# Patient Record
Sex: Male | Born: 1975
Health system: Southern US, Community
[De-identification: ages and names within clinical notes are randomized; demographics above are authoritative.]

## PROBLEM LIST (undated history)

## (undated) DIAGNOSIS — N289 Disorder of kidney and ureter, unspecified: Secondary | ICD-10-CM

## (undated) DIAGNOSIS — I10 Essential (primary) hypertension: Secondary | ICD-10-CM

## (undated) DIAGNOSIS — Z941 Heart transplant status: Secondary | ICD-10-CM

## (undated) HISTORY — PX: HERNIA REPAIR: SHX51

## (undated) HISTORY — PX: CHOLECYSTECTOMY: SHX55

## (undated) HISTORY — PX: APPENDECTOMY: SHX54

## (undated) HISTORY — PX: OTHER SURGICAL HISTORY: SHX169

## (undated) HISTORY — PX: CARDIAC SURGERY: SHX584

---

## 2005-02-01 ENCOUNTER — Emergency Department (HOSPITAL_COMMUNITY): Admission: EM | Admit: 2005-02-01 | Discharge: 2005-02-01 | Payer: Self-pay | Admitting: Emergency Medicine

## 2008-01-02 ENCOUNTER — Emergency Department (HOSPITAL_COMMUNITY): Admission: EM | Admit: 2008-01-02 | Discharge: 2008-01-02 | Payer: Self-pay | Admitting: Emergency Medicine

## 2008-02-02 ENCOUNTER — Emergency Department (HOSPITAL_COMMUNITY): Admission: EM | Admit: 2008-02-02 | Discharge: 2008-02-03 | Payer: Self-pay | Admitting: Emergency Medicine

## 2008-02-07 ENCOUNTER — Emergency Department (HOSPITAL_COMMUNITY): Admission: EM | Admit: 2008-02-07 | Discharge: 2008-02-07 | Payer: Self-pay | Admitting: Emergency Medicine

## 2008-03-07 ENCOUNTER — Ambulatory Visit: Payer: Self-pay | Admitting: Cardiology

## 2008-03-15 ENCOUNTER — Emergency Department (HOSPITAL_COMMUNITY): Admission: EM | Admit: 2008-03-15 | Discharge: 2008-03-16 | Payer: Self-pay | Admitting: Emergency Medicine

## 2008-03-26 ENCOUNTER — Emergency Department (HOSPITAL_COMMUNITY): Admission: EM | Admit: 2008-03-26 | Discharge: 2008-03-27 | Payer: Self-pay | Admitting: Emergency Medicine

## 2008-04-03 ENCOUNTER — Emergency Department (HOSPITAL_COMMUNITY): Admission: EM | Admit: 2008-04-03 | Discharge: 2008-04-03 | Payer: Self-pay | Admitting: Emergency Medicine

## 2011-07-30 LAB — URINALYSIS, ROUTINE W REFLEX MICROSCOPIC
Bilirubin Urine: NEGATIVE
Glucose, UA: NEGATIVE
Ketones, ur: NEGATIVE
Protein, ur: NEGATIVE
Urobilinogen, UA: 0.2

## 2011-07-30 LAB — URINE MICROSCOPIC-ADD ON

## 2011-07-30 LAB — URINE CULTURE

## 2011-08-02 LAB — D-DIMER, QUANTITATIVE: D-Dimer, Quant: 0.22

## 2011-08-13 DIAGNOSIS — Z720 Tobacco use: Secondary | ICD-10-CM | POA: Diagnosis present

## 2011-08-13 DIAGNOSIS — I10 Essential (primary) hypertension: Secondary | ICD-10-CM | POA: Insufficient documentation

## 2011-08-17 DIAGNOSIS — Z8719 Personal history of other diseases of the digestive system: Secondary | ICD-10-CM | POA: Insufficient documentation

## 2011-08-17 DIAGNOSIS — E790 Hyperuricemia without signs of inflammatory arthritis and tophaceous disease: Secondary | ICD-10-CM | POA: Insufficient documentation

## 2014-01-26 DIAGNOSIS — I25811 Atherosclerosis of native coronary artery of transplanted heart without angina pectoris: Secondary | ICD-10-CM | POA: Insufficient documentation

## 2014-05-14 DIAGNOSIS — Z952 Presence of prosthetic heart valve: Secondary | ICD-10-CM

## 2015-11-29 DIAGNOSIS — R404 Transient alteration of awareness: Secondary | ICD-10-CM | POA: Diagnosis not present

## 2015-11-29 DIAGNOSIS — M7989 Other specified soft tissue disorders: Secondary | ICD-10-CM | POA: Diagnosis not present

## 2015-11-29 DIAGNOSIS — Z941 Heart transplant status: Secondary | ICD-10-CM | POA: Diagnosis not present

## 2015-11-29 DIAGNOSIS — R531 Weakness: Secondary | ICD-10-CM | POA: Diagnosis not present

## 2015-11-29 DIAGNOSIS — E871 Hypo-osmolality and hyponatremia: Secondary | ICD-10-CM | POA: Diagnosis not present

## 2015-11-29 DIAGNOSIS — R600 Localized edema: Secondary | ICD-10-CM | POA: Diagnosis not present

## 2015-11-29 DIAGNOSIS — Z79899 Other long term (current) drug therapy: Secondary | ICD-10-CM | POA: Diagnosis not present

## 2015-11-29 DIAGNOSIS — Z7982 Long term (current) use of aspirin: Secondary | ICD-10-CM | POA: Diagnosis not present

## 2015-11-29 DIAGNOSIS — I10 Essential (primary) hypertension: Secondary | ICD-10-CM | POA: Diagnosis not present

## 2015-11-29 DIAGNOSIS — D649 Anemia, unspecified: Secondary | ICD-10-CM | POA: Diagnosis not present

## 2015-11-29 DIAGNOSIS — M109 Gout, unspecified: Secondary | ICD-10-CM | POA: Diagnosis not present

## 2015-11-29 DIAGNOSIS — K219 Gastro-esophageal reflux disease without esophagitis: Secondary | ICD-10-CM | POA: Diagnosis not present

## 2015-11-29 DIAGNOSIS — F17200 Nicotine dependence, unspecified, uncomplicated: Secondary | ICD-10-CM | POA: Diagnosis not present

## 2015-11-29 DIAGNOSIS — N289 Disorder of kidney and ureter, unspecified: Secondary | ICD-10-CM | POA: Diagnosis not present

## 2015-11-30 DIAGNOSIS — Z941 Heart transplant status: Secondary | ICD-10-CM | POA: Diagnosis not present

## 2015-11-30 DIAGNOSIS — I129 Hypertensive chronic kidney disease with stage 1 through stage 4 chronic kidney disease, or unspecified chronic kidney disease: Secondary | ICD-10-CM | POA: Diagnosis not present

## 2015-11-30 DIAGNOSIS — N183 Chronic kidney disease, stage 3 (moderate): Secondary | ICD-10-CM | POA: Diagnosis not present

## 2015-11-30 DIAGNOSIS — R0602 Shortness of breath: Secondary | ICD-10-CM | POA: Diagnosis not present

## 2015-11-30 DIAGNOSIS — I25811 Atherosclerosis of native coronary artery of transplanted heart without angina pectoris: Secondary | ICD-10-CM | POA: Diagnosis not present

## 2015-11-30 DIAGNOSIS — Z79899 Other long term (current) drug therapy: Secondary | ICD-10-CM | POA: Diagnosis not present

## 2015-11-30 DIAGNOSIS — Z87898 Personal history of other specified conditions: Secondary | ICD-10-CM | POA: Diagnosis not present

## 2015-11-30 DIAGNOSIS — R609 Edema, unspecified: Secondary | ICD-10-CM | POA: Diagnosis not present

## 2015-11-30 DIAGNOSIS — Z720 Tobacco use: Secondary | ICD-10-CM | POA: Diagnosis not present

## 2015-11-30 DIAGNOSIS — N179 Acute kidney failure, unspecified: Secondary | ICD-10-CM | POA: Diagnosis not present

## 2015-12-01 DIAGNOSIS — Z79899 Other long term (current) drug therapy: Secondary | ICD-10-CM | POA: Diagnosis not present

## 2015-12-01 DIAGNOSIS — Z941 Heart transplant status: Secondary | ICD-10-CM | POA: Diagnosis not present

## 2015-12-01 DIAGNOSIS — I129 Hypertensive chronic kidney disease with stage 1 through stage 4 chronic kidney disease, or unspecified chronic kidney disease: Secondary | ICD-10-CM | POA: Diagnosis not present

## 2015-12-01 DIAGNOSIS — N179 Acute kidney failure, unspecified: Secondary | ICD-10-CM | POA: Diagnosis not present

## 2015-12-01 DIAGNOSIS — N183 Chronic kidney disease, stage 3 (moderate): Secondary | ICD-10-CM | POA: Diagnosis not present

## 2015-12-01 DIAGNOSIS — I10 Essential (primary) hypertension: Secondary | ICD-10-CM | POA: Diagnosis not present

## 2015-12-01 DIAGNOSIS — I25811 Atherosclerosis of native coronary artery of transplanted heart without angina pectoris: Secondary | ICD-10-CM | POA: Diagnosis not present

## 2015-12-01 DIAGNOSIS — Z720 Tobacco use: Secondary | ICD-10-CM | POA: Diagnosis not present

## 2015-12-16 DIAGNOSIS — Z6822 Body mass index (BMI) 22.0-22.9, adult: Secondary | ICD-10-CM | POA: Diagnosis not present

## 2015-12-16 DIAGNOSIS — Z720 Tobacco use: Secondary | ICD-10-CM | POA: Diagnosis not present

## 2015-12-16 DIAGNOSIS — N19 Unspecified kidney failure: Secondary | ICD-10-CM | POA: Diagnosis not present

## 2015-12-16 DIAGNOSIS — R600 Localized edema: Secondary | ICD-10-CM | POA: Diagnosis not present

## 2015-12-17 DIAGNOSIS — Z79899 Other long term (current) drug therapy: Secondary | ICD-10-CM | POA: Diagnosis not present

## 2015-12-17 DIAGNOSIS — Z941 Heart transplant status: Secondary | ICD-10-CM | POA: Diagnosis not present

## 2015-12-17 DIAGNOSIS — Z5181 Encounter for therapeutic drug level monitoring: Secondary | ICD-10-CM | POA: Diagnosis not present

## 2015-12-19 DIAGNOSIS — Z941 Heart transplant status: Secondary | ICD-10-CM | POA: Diagnosis not present

## 2015-12-31 DIAGNOSIS — Z941 Heart transplant status: Secondary | ICD-10-CM | POA: Diagnosis not present

## 2016-01-24 DIAGNOSIS — Z7982 Long term (current) use of aspirin: Secondary | ICD-10-CM | POA: Diagnosis not present

## 2016-01-24 DIAGNOSIS — H60502 Unspecified acute noninfective otitis externa, left ear: Secondary | ICD-10-CM | POA: Diagnosis not present

## 2016-01-24 DIAGNOSIS — M109 Gout, unspecified: Secondary | ICD-10-CM | POA: Diagnosis not present

## 2016-01-24 DIAGNOSIS — Z941 Heart transplant status: Secondary | ICD-10-CM | POA: Diagnosis not present

## 2016-01-24 DIAGNOSIS — Z79899 Other long term (current) drug therapy: Secondary | ICD-10-CM | POA: Diagnosis not present

## 2016-01-24 DIAGNOSIS — I10 Essential (primary) hypertension: Secondary | ICD-10-CM | POA: Diagnosis not present

## 2016-01-24 DIAGNOSIS — K219 Gastro-esophageal reflux disease without esophagitis: Secondary | ICD-10-CM | POA: Diagnosis not present

## 2016-01-24 DIAGNOSIS — F17200 Nicotine dependence, unspecified, uncomplicated: Secondary | ICD-10-CM | POA: Diagnosis not present

## 2016-01-27 DIAGNOSIS — E7800 Pure hypercholesterolemia, unspecified: Secondary | ICD-10-CM | POA: Diagnosis not present

## 2016-01-27 DIAGNOSIS — I10 Essential (primary) hypertension: Secondary | ICD-10-CM | POA: Diagnosis not present

## 2016-02-19 DIAGNOSIS — E7800 Pure hypercholesterolemia, unspecified: Secondary | ICD-10-CM | POA: Diagnosis not present

## 2016-02-19 DIAGNOSIS — I10 Essential (primary) hypertension: Secondary | ICD-10-CM | POA: Diagnosis not present

## 2016-03-01 DIAGNOSIS — Z941 Heart transplant status: Secondary | ICD-10-CM | POA: Diagnosis not present

## 2016-03-01 DIAGNOSIS — R609 Edema, unspecified: Secondary | ICD-10-CM | POA: Diagnosis not present

## 2016-03-22 DIAGNOSIS — E7800 Pure hypercholesterolemia, unspecified: Secondary | ICD-10-CM | POA: Diagnosis not present

## 2016-03-22 DIAGNOSIS — I10 Essential (primary) hypertension: Secondary | ICD-10-CM | POA: Diagnosis not present

## 2016-03-23 DIAGNOSIS — K5900 Constipation, unspecified: Secondary | ICD-10-CM | POA: Diagnosis not present

## 2016-03-23 DIAGNOSIS — E7800 Pure hypercholesterolemia, unspecified: Secondary | ICD-10-CM | POA: Diagnosis not present

## 2016-03-23 DIAGNOSIS — I10 Essential (primary) hypertension: Secondary | ICD-10-CM | POA: Diagnosis not present

## 2016-03-30 DIAGNOSIS — F17210 Nicotine dependence, cigarettes, uncomplicated: Secondary | ICD-10-CM | POA: Diagnosis not present

## 2016-03-30 DIAGNOSIS — Z7982 Long term (current) use of aspirin: Secondary | ICD-10-CM | POA: Diagnosis not present

## 2016-03-30 DIAGNOSIS — Z4821 Encounter for aftercare following heart transplant: Secondary | ICD-10-CM | POA: Diagnosis not present

## 2016-03-30 DIAGNOSIS — I25811 Atherosclerosis of native coronary artery of transplanted heart without angina pectoris: Secondary | ICD-10-CM | POA: Diagnosis not present

## 2016-03-30 DIAGNOSIS — Z79899 Other long term (current) drug therapy: Secondary | ICD-10-CM | POA: Diagnosis not present

## 2016-03-30 DIAGNOSIS — I129 Hypertensive chronic kidney disease with stage 1 through stage 4 chronic kidney disease, or unspecified chronic kidney disease: Secondary | ICD-10-CM | POA: Diagnosis not present

## 2016-03-30 DIAGNOSIS — Z941 Heart transplant status: Secondary | ICD-10-CM | POA: Diagnosis not present

## 2016-03-30 DIAGNOSIS — R109 Unspecified abdominal pain: Secondary | ICD-10-CM | POA: Diagnosis not present

## 2016-03-30 DIAGNOSIS — N184 Chronic kidney disease, stage 4 (severe): Secondary | ICD-10-CM | POA: Diagnosis not present

## 2016-04-02 DIAGNOSIS — R1033 Periumbilical pain: Secondary | ICD-10-CM

## 2016-04-08 DIAGNOSIS — R109 Unspecified abdominal pain: Secondary | ICD-10-CM | POA: Diagnosis not present

## 2016-04-08 DIAGNOSIS — R599 Enlarged lymph nodes, unspecified: Secondary | ICD-10-CM | POA: Diagnosis not present

## 2016-04-08 DIAGNOSIS — Z941 Heart transplant status: Secondary | ICD-10-CM | POA: Diagnosis not present

## 2016-04-08 DIAGNOSIS — Z79899 Other long term (current) drug therapy: Secondary | ICD-10-CM | POA: Diagnosis not present

## 2016-05-06 DIAGNOSIS — E7800 Pure hypercholesterolemia, unspecified: Secondary | ICD-10-CM | POA: Diagnosis not present

## 2016-05-06 DIAGNOSIS — I10 Essential (primary) hypertension: Secondary | ICD-10-CM | POA: Diagnosis not present

## 2016-05-14 DIAGNOSIS — F17210 Nicotine dependence, cigarettes, uncomplicated: Secondary | ICD-10-CM | POA: Diagnosis not present

## 2016-05-14 DIAGNOSIS — I2510 Atherosclerotic heart disease of native coronary artery without angina pectoris: Secondary | ICD-10-CM | POA: Diagnosis not present

## 2016-05-14 DIAGNOSIS — Z79899 Other long term (current) drug therapy: Secondary | ICD-10-CM | POA: Diagnosis not present

## 2016-05-14 DIAGNOSIS — Z7982 Long term (current) use of aspirin: Secondary | ICD-10-CM | POA: Diagnosis not present

## 2016-05-14 DIAGNOSIS — I129 Hypertensive chronic kidney disease with stage 1 through stage 4 chronic kidney disease, or unspecified chronic kidney disease: Secondary | ICD-10-CM | POA: Diagnosis not present

## 2016-05-14 DIAGNOSIS — R1030 Lower abdominal pain, unspecified: Secondary | ICD-10-CM | POA: Diagnosis not present

## 2016-05-14 DIAGNOSIS — N184 Chronic kidney disease, stage 4 (severe): Secondary | ICD-10-CM | POA: Diagnosis not present

## 2016-05-14 DIAGNOSIS — K5900 Constipation, unspecified: Secondary | ICD-10-CM | POA: Diagnosis not present

## 2016-05-14 DIAGNOSIS — I509 Heart failure, unspecified: Secondary | ICD-10-CM | POA: Diagnosis not present

## 2016-05-14 DIAGNOSIS — D649 Anemia, unspecified: Secondary | ICD-10-CM | POA: Diagnosis not present

## 2016-05-19 DIAGNOSIS — E7800 Pure hypercholesterolemia, unspecified: Secondary | ICD-10-CM | POA: Diagnosis not present

## 2016-05-19 DIAGNOSIS — I10 Essential (primary) hypertension: Secondary | ICD-10-CM | POA: Diagnosis not present

## 2016-06-02 DIAGNOSIS — R109 Unspecified abdominal pain: Secondary | ICD-10-CM | POA: Diagnosis not present

## 2016-06-02 DIAGNOSIS — K219 Gastro-esophageal reflux disease without esophagitis: Secondary | ICD-10-CM | POA: Diagnosis not present

## 2016-06-02 DIAGNOSIS — I700 Atherosclerosis of aorta: Secondary | ICD-10-CM | POA: Diagnosis not present

## 2016-06-02 DIAGNOSIS — I429 Cardiomyopathy, unspecified: Secondary | ICD-10-CM | POA: Diagnosis not present

## 2016-06-02 DIAGNOSIS — Z833 Family history of diabetes mellitus: Secondary | ICD-10-CM | POA: Diagnosis not present

## 2016-06-02 DIAGNOSIS — Z79899 Other long term (current) drug therapy: Secondary | ICD-10-CM | POA: Diagnosis not present

## 2016-06-02 DIAGNOSIS — N3000 Acute cystitis without hematuria: Secondary | ICD-10-CM | POA: Diagnosis not present

## 2016-06-02 DIAGNOSIS — K8020 Calculus of gallbladder without cholecystitis without obstruction: Secondary | ICD-10-CM | POA: Diagnosis not present

## 2016-06-02 DIAGNOSIS — I10 Essential (primary) hypertension: Secondary | ICD-10-CM | POA: Diagnosis not present

## 2016-06-02 DIAGNOSIS — I2510 Atherosclerotic heart disease of native coronary artery without angina pectoris: Secondary | ICD-10-CM | POA: Diagnosis not present

## 2016-06-02 DIAGNOSIS — R1030 Lower abdominal pain, unspecified: Secondary | ICD-10-CM | POA: Diagnosis not present

## 2016-06-02 DIAGNOSIS — Z941 Heart transplant status: Secondary | ICD-10-CM | POA: Diagnosis not present

## 2016-06-02 DIAGNOSIS — Z7982 Long term (current) use of aspirin: Secondary | ICD-10-CM | POA: Diagnosis not present

## 2016-06-02 DIAGNOSIS — N390 Urinary tract infection, site not specified: Secondary | ICD-10-CM | POA: Diagnosis not present

## 2016-06-02 DIAGNOSIS — Z801 Family history of malignant neoplasm of trachea, bronchus and lung: Secondary | ICD-10-CM | POA: Diagnosis not present

## 2016-06-04 DIAGNOSIS — R1030 Lower abdominal pain, unspecified: Secondary | ICD-10-CM | POA: Diagnosis not present

## 2016-06-14 DIAGNOSIS — Z0000 Encounter for general adult medical examination without abnormal findings: Secondary | ICD-10-CM | POA: Diagnosis not present

## 2016-06-14 DIAGNOSIS — Z720 Tobacco use: Secondary | ICD-10-CM | POA: Diagnosis not present

## 2016-06-14 DIAGNOSIS — E7800 Pure hypercholesterolemia, unspecified: Secondary | ICD-10-CM | POA: Diagnosis not present

## 2016-06-14 DIAGNOSIS — Z7189 Other specified counseling: Secondary | ICD-10-CM | POA: Diagnosis not present

## 2016-06-14 DIAGNOSIS — Z299 Encounter for prophylactic measures, unspecified: Secondary | ICD-10-CM | POA: Diagnosis not present

## 2016-06-14 DIAGNOSIS — F17200 Nicotine dependence, unspecified, uncomplicated: Secondary | ICD-10-CM | POA: Diagnosis not present

## 2016-06-14 DIAGNOSIS — Z1211 Encounter for screening for malignant neoplasm of colon: Secondary | ICD-10-CM | POA: Diagnosis not present

## 2016-06-14 DIAGNOSIS — Z79899 Other long term (current) drug therapy: Secondary | ICD-10-CM | POA: Diagnosis not present

## 2016-06-14 DIAGNOSIS — Z1389 Encounter for screening for other disorder: Secondary | ICD-10-CM | POA: Diagnosis not present

## 2016-06-14 DIAGNOSIS — R5383 Other fatigue: Secondary | ICD-10-CM | POA: Diagnosis not present

## 2016-06-29 DIAGNOSIS — I10 Essential (primary) hypertension: Secondary | ICD-10-CM | POA: Diagnosis not present

## 2016-06-29 DIAGNOSIS — D62 Acute posthemorrhagic anemia: Secondary | ICD-10-CM | POA: Insufficient documentation

## 2016-06-29 DIAGNOSIS — Z01812 Encounter for preprocedural laboratory examination: Secondary | ICD-10-CM | POA: Diagnosis not present

## 2016-07-05 ENCOUNTER — Other Ambulatory Visit: Payer: Self-pay | Admitting: Obstetrics and Gynecology

## 2016-07-05 MED ORDER — METRONIDAZOLE 500 MG PO TABS: 500.0000 mg | ORAL_TABLET | Freq: Two times a day (BID) | ORAL | 0 refills | Status: DC

## 2016-07-07 DIAGNOSIS — F17210 Nicotine dependence, cigarettes, uncomplicated: Secondary | ICD-10-CM | POA: Diagnosis not present

## 2016-07-07 DIAGNOSIS — N184 Chronic kidney disease, stage 4 (severe): Secondary | ICD-10-CM | POA: Diagnosis not present

## 2016-07-07 DIAGNOSIS — Z85048 Personal history of other malignant neoplasm of rectum, rectosigmoid junction, and anus: Secondary | ICD-10-CM | POA: Diagnosis not present

## 2016-07-07 DIAGNOSIS — Z1211 Encounter for screening for malignant neoplasm of colon: Secondary | ICD-10-CM | POA: Diagnosis not present

## 2016-07-07 DIAGNOSIS — I130 Hypertensive heart and chronic kidney disease with heart failure and stage 1 through stage 4 chronic kidney disease, or unspecified chronic kidney disease: Secondary | ICD-10-CM | POA: Diagnosis not present

## 2016-07-07 DIAGNOSIS — Z888 Allergy status to other drugs, medicaments and biological substances status: Secondary | ICD-10-CM | POA: Diagnosis not present

## 2016-07-07 DIAGNOSIS — Z941 Heart transplant status: Secondary | ICD-10-CM | POA: Diagnosis not present

## 2016-07-07 DIAGNOSIS — Z85828 Personal history of other malignant neoplasm of skin: Secondary | ICD-10-CM | POA: Diagnosis not present

## 2016-07-07 DIAGNOSIS — E785 Hyperlipidemia, unspecified: Secondary | ICD-10-CM | POA: Diagnosis not present

## 2016-07-07 DIAGNOSIS — I10 Essential (primary) hypertension: Secondary | ICD-10-CM | POA: Diagnosis not present

## 2016-07-07 DIAGNOSIS — M109 Gout, unspecified: Secondary | ICD-10-CM | POA: Diagnosis not present

## 2016-07-07 DIAGNOSIS — Z79899 Other long term (current) drug therapy: Secondary | ICD-10-CM | POA: Diagnosis not present

## 2016-07-07 DIAGNOSIS — K219 Gastro-esophageal reflux disease without esophagitis: Secondary | ICD-10-CM | POA: Diagnosis not present

## 2016-07-07 DIAGNOSIS — I25811 Atherosclerosis of native coronary artery of transplanted heart without angina pectoris: Secondary | ICD-10-CM | POA: Diagnosis not present

## 2016-07-07 DIAGNOSIS — A630 Anogenital (venereal) warts: Secondary | ICD-10-CM | POA: Diagnosis not present

## 2016-07-07 DIAGNOSIS — Z7982 Long term (current) use of aspirin: Secondary | ICD-10-CM | POA: Diagnosis not present

## 2016-07-07 DIAGNOSIS — R1030 Lower abdominal pain, unspecified: Secondary | ICD-10-CM | POA: Diagnosis not present

## 2016-07-16 DIAGNOSIS — I10 Essential (primary) hypertension: Secondary | ICD-10-CM | POA: Diagnosis not present

## 2016-07-16 DIAGNOSIS — E7800 Pure hypercholesterolemia, unspecified: Secondary | ICD-10-CM | POA: Diagnosis not present

## 2016-07-23 DIAGNOSIS — Z941 Heart transplant status: Secondary | ICD-10-CM | POA: Diagnosis not present

## 2016-07-23 DIAGNOSIS — I420 Dilated cardiomyopathy: Secondary | ICD-10-CM | POA: Diagnosis not present

## 2016-07-23 DIAGNOSIS — Z79899 Other long term (current) drug therapy: Secondary | ICD-10-CM | POA: Diagnosis not present

## 2016-07-23 DIAGNOSIS — N184 Chronic kidney disease, stage 4 (severe): Secondary | ICD-10-CM | POA: Diagnosis not present

## 2016-07-23 DIAGNOSIS — D631 Anemia in chronic kidney disease: Secondary | ICD-10-CM | POA: Diagnosis not present

## 2016-07-23 DIAGNOSIS — E559 Vitamin D deficiency, unspecified: Secondary | ICD-10-CM | POA: Diagnosis not present

## 2016-07-23 DIAGNOSIS — N183 Chronic kidney disease, stage 3 (moderate): Secondary | ICD-10-CM | POA: Diagnosis not present

## 2016-07-23 DIAGNOSIS — I130 Hypertensive heart and chronic kidney disease with heart failure and stage 1 through stage 4 chronic kidney disease, or unspecified chronic kidney disease: Secondary | ICD-10-CM | POA: Diagnosis not present

## 2016-07-23 DIAGNOSIS — I25811 Atherosclerosis of native coronary artery of transplanted heart without angina pectoris: Secondary | ICD-10-CM | POA: Diagnosis not present

## 2016-07-23 DIAGNOSIS — F17210 Nicotine dependence, cigarettes, uncomplicated: Secondary | ICD-10-CM | POA: Diagnosis not present

## 2016-07-25 DIAGNOSIS — Z79899 Other long term (current) drug therapy: Secondary | ICD-10-CM | POA: Diagnosis not present

## 2016-07-25 DIAGNOSIS — Z7982 Long term (current) use of aspirin: Secondary | ICD-10-CM | POA: Diagnosis not present

## 2016-07-25 DIAGNOSIS — R112 Nausea with vomiting, unspecified: Secondary | ICD-10-CM | POA: Diagnosis not present

## 2016-07-25 DIAGNOSIS — Z941 Heart transplant status: Secondary | ICD-10-CM | POA: Diagnosis not present

## 2016-07-25 DIAGNOSIS — F17200 Nicotine dependence, unspecified, uncomplicated: Secondary | ICD-10-CM | POA: Diagnosis not present

## 2016-07-25 DIAGNOSIS — B349 Viral infection, unspecified: Secondary | ICD-10-CM | POA: Diagnosis not present

## 2016-07-25 DIAGNOSIS — M109 Gout, unspecified: Secondary | ICD-10-CM | POA: Diagnosis not present

## 2016-07-25 DIAGNOSIS — N189 Chronic kidney disease, unspecified: Secondary | ICD-10-CM | POA: Diagnosis not present

## 2016-07-25 DIAGNOSIS — K219 Gastro-esophageal reflux disease without esophagitis: Secondary | ICD-10-CM | POA: Diagnosis not present

## 2016-07-25 DIAGNOSIS — I129 Hypertensive chronic kidney disease with stage 1 through stage 4 chronic kidney disease, or unspecified chronic kidney disease: Secondary | ICD-10-CM | POA: Diagnosis not present

## 2016-07-29 DIAGNOSIS — I130 Hypertensive heart and chronic kidney disease with heart failure and stage 1 through stage 4 chronic kidney disease, or unspecified chronic kidney disease: Secondary | ICD-10-CM | POA: Diagnosis not present

## 2016-07-29 DIAGNOSIS — Z7982 Long term (current) use of aspirin: Secondary | ICD-10-CM | POA: Diagnosis not present

## 2016-07-29 DIAGNOSIS — Z941 Heart transplant status: Secondary | ICD-10-CM | POA: Diagnosis not present

## 2016-07-29 DIAGNOSIS — R1030 Lower abdominal pain, unspecified: Secondary | ICD-10-CM | POA: Diagnosis not present

## 2016-07-29 DIAGNOSIS — D509 Iron deficiency anemia, unspecified: Secondary | ICD-10-CM | POA: Insufficient documentation

## 2016-07-29 DIAGNOSIS — R1033 Periumbilical pain: Secondary | ICD-10-CM | POA: Diagnosis not present

## 2016-07-29 DIAGNOSIS — C218 Malignant neoplasm of overlapping sites of rectum, anus and anal canal: Secondary | ICD-10-CM | POA: Diagnosis not present

## 2016-07-29 DIAGNOSIS — I509 Heart failure, unspecified: Secondary | ICD-10-CM | POA: Diagnosis not present

## 2016-07-29 DIAGNOSIS — Z888 Allergy status to other drugs, medicaments and biological substances status: Secondary | ICD-10-CM | POA: Diagnosis not present

## 2016-07-29 DIAGNOSIS — D508 Other iron deficiency anemias: Secondary | ICD-10-CM | POA: Diagnosis not present

## 2016-07-29 DIAGNOSIS — Z8719 Personal history of other diseases of the digestive system: Secondary | ICD-10-CM | POA: Diagnosis not present

## 2016-07-29 DIAGNOSIS — I2510 Atherosclerotic heart disease of native coronary artery without angina pectoris: Secondary | ICD-10-CM | POA: Diagnosis not present

## 2016-07-29 DIAGNOSIS — K219 Gastro-esophageal reflux disease without esophagitis: Secondary | ICD-10-CM | POA: Diagnosis not present

## 2016-07-29 DIAGNOSIS — F17210 Nicotine dependence, cigarettes, uncomplicated: Secondary | ICD-10-CM | POA: Diagnosis not present

## 2016-07-29 DIAGNOSIS — Z79899 Other long term (current) drug therapy: Secondary | ICD-10-CM | POA: Diagnosis not present

## 2016-07-29 DIAGNOSIS — N189 Chronic kidney disease, unspecified: Secondary | ICD-10-CM | POA: Diagnosis not present

## 2016-07-29 DIAGNOSIS — Z886 Allergy status to analgesic agent status: Secondary | ICD-10-CM | POA: Diagnosis not present

## 2016-08-09 DIAGNOSIS — R1033 Periumbilical pain: Secondary | ICD-10-CM | POA: Diagnosis not present

## 2016-08-09 DIAGNOSIS — C218 Malignant neoplasm of overlapping sites of rectum, anus and anal canal: Secondary | ICD-10-CM | POA: Diagnosis not present

## 2016-08-09 DIAGNOSIS — K8020 Calculus of gallbladder without cholecystitis without obstruction: Secondary | ICD-10-CM | POA: Diagnosis not present

## 2016-08-30 DIAGNOSIS — I10 Essential (primary) hypertension: Secondary | ICD-10-CM | POA: Diagnosis not present

## 2016-08-30 DIAGNOSIS — E7800 Pure hypercholesterolemia, unspecified: Secondary | ICD-10-CM | POA: Diagnosis not present

## 2016-08-31 DIAGNOSIS — Z941 Heart transplant status: Secondary | ICD-10-CM | POA: Diagnosis not present

## 2016-08-31 DIAGNOSIS — I083 Combined rheumatic disorders of mitral, aortic and tricuspid valves: Secondary | ICD-10-CM | POA: Diagnosis not present

## 2016-08-31 DIAGNOSIS — I208 Other forms of angina pectoris: Secondary | ICD-10-CM | POA: Diagnosis not present

## 2016-08-31 DIAGNOSIS — I272 Pulmonary hypertension, unspecified: Secondary | ICD-10-CM | POA: Diagnosis not present

## 2016-08-31 DIAGNOSIS — E790 Hyperuricemia without signs of inflammatory arthritis and tophaceous disease: Secondary | ICD-10-CM | POA: Diagnosis not present

## 2016-08-31 DIAGNOSIS — R1013 Epigastric pain: Secondary | ICD-10-CM | POA: Diagnosis not present

## 2016-08-31 DIAGNOSIS — Z79899 Other long term (current) drug therapy: Secondary | ICD-10-CM | POA: Diagnosis not present

## 2016-09-10 DIAGNOSIS — I10 Essential (primary) hypertension: Secondary | ICD-10-CM | POA: Diagnosis not present

## 2016-09-10 DIAGNOSIS — E785 Hyperlipidemia, unspecified: Secondary | ICD-10-CM | POA: Diagnosis not present

## 2016-09-10 DIAGNOSIS — I129 Hypertensive chronic kidney disease with stage 1 through stage 4 chronic kidney disease, or unspecified chronic kidney disease: Secondary | ICD-10-CM | POA: Diagnosis not present

## 2016-09-10 DIAGNOSIS — R109 Unspecified abdominal pain: Secondary | ICD-10-CM | POA: Diagnosis not present

## 2016-09-14 DIAGNOSIS — N189 Chronic kidney disease, unspecified: Secondary | ICD-10-CM | POA: Diagnosis not present

## 2016-09-14 DIAGNOSIS — Z941 Heart transplant status: Secondary | ICD-10-CM | POA: Diagnosis not present

## 2016-09-14 DIAGNOSIS — I129 Hypertensive chronic kidney disease with stage 1 through stage 4 chronic kidney disease, or unspecified chronic kidney disease: Secondary | ICD-10-CM | POA: Diagnosis not present

## 2016-09-14 DIAGNOSIS — I208 Other forms of angina pectoris: Secondary | ICD-10-CM | POA: Diagnosis not present

## 2016-09-14 DIAGNOSIS — R1013 Epigastric pain: Secondary | ICD-10-CM | POA: Diagnosis not present

## 2016-09-17 DIAGNOSIS — K432 Incisional hernia without obstruction or gangrene: Secondary | ICD-10-CM | POA: Diagnosis not present

## 2016-09-17 DIAGNOSIS — K8020 Calculus of gallbladder without cholecystitis without obstruction: Secondary | ICD-10-CM | POA: Diagnosis not present

## 2016-09-17 DIAGNOSIS — I509 Heart failure, unspecified: Secondary | ICD-10-CM | POA: Diagnosis not present

## 2016-09-17 DIAGNOSIS — I2510 Atherosclerotic heart disease of native coronary artery without angina pectoris: Secondary | ICD-10-CM | POA: Diagnosis not present

## 2016-09-17 DIAGNOSIS — F17210 Nicotine dependence, cigarettes, uncomplicated: Secondary | ICD-10-CM | POA: Diagnosis not present

## 2016-09-17 DIAGNOSIS — K219 Gastro-esophageal reflux disease without esophagitis: Secondary | ICD-10-CM | POA: Diagnosis not present

## 2016-09-17 DIAGNOSIS — Z7982 Long term (current) use of aspirin: Secondary | ICD-10-CM | POA: Diagnosis not present

## 2016-09-17 DIAGNOSIS — Z886 Allergy status to analgesic agent status: Secondary | ICD-10-CM | POA: Diagnosis not present

## 2016-09-17 DIAGNOSIS — Z888 Allergy status to other drugs, medicaments and biological substances status: Secondary | ICD-10-CM | POA: Diagnosis not present

## 2016-09-17 DIAGNOSIS — K439 Ventral hernia without obstruction or gangrene: Secondary | ICD-10-CM | POA: Diagnosis not present

## 2016-09-17 DIAGNOSIS — Z941 Heart transplant status: Secondary | ICD-10-CM | POA: Diagnosis not present

## 2016-09-17 DIAGNOSIS — R1013 Epigastric pain: Secondary | ICD-10-CM | POA: Diagnosis not present

## 2016-09-17 DIAGNOSIS — N184 Chronic kidney disease, stage 4 (severe): Secondary | ICD-10-CM | POA: Diagnosis not present

## 2016-09-17 DIAGNOSIS — Z79899 Other long term (current) drug therapy: Secondary | ICD-10-CM | POA: Diagnosis not present

## 2016-09-17 DIAGNOSIS — I130 Hypertensive heart and chronic kidney disease with heart failure and stage 1 through stage 4 chronic kidney disease, or unspecified chronic kidney disease: Secondary | ICD-10-CM | POA: Diagnosis not present

## 2016-09-23 DIAGNOSIS — E7800 Pure hypercholesterolemia, unspecified: Secondary | ICD-10-CM | POA: Diagnosis not present

## 2016-09-23 DIAGNOSIS — I10 Essential (primary) hypertension: Secondary | ICD-10-CM | POA: Diagnosis not present

## 2016-09-27 DIAGNOSIS — F17210 Nicotine dependence, cigarettes, uncomplicated: Secondary | ICD-10-CM | POA: Diagnosis not present

## 2016-09-27 DIAGNOSIS — I351 Nonrheumatic aortic (valve) insufficiency: Secondary | ICD-10-CM | POA: Diagnosis not present

## 2016-09-27 DIAGNOSIS — Z886 Allergy status to analgesic agent status: Secondary | ICD-10-CM | POA: Diagnosis not present

## 2016-09-27 DIAGNOSIS — I130 Hypertensive heart and chronic kidney disease with heart failure and stage 1 through stage 4 chronic kidney disease, or unspecified chronic kidney disease: Secondary | ICD-10-CM | POA: Diagnosis not present

## 2016-09-27 DIAGNOSIS — I509 Heart failure, unspecified: Secondary | ICD-10-CM | POA: Diagnosis not present

## 2016-09-27 DIAGNOSIS — Z7982 Long term (current) use of aspirin: Secondary | ICD-10-CM | POA: Diagnosis not present

## 2016-09-27 DIAGNOSIS — I2510 Atherosclerotic heart disease of native coronary artery without angina pectoris: Secondary | ICD-10-CM | POA: Diagnosis not present

## 2016-09-27 DIAGNOSIS — Z85048 Personal history of other malignant neoplasm of rectum, rectosigmoid junction, and anus: Secondary | ICD-10-CM | POA: Diagnosis not present

## 2016-09-27 DIAGNOSIS — Z941 Heart transplant status: Secondary | ICD-10-CM | POA: Diagnosis not present

## 2016-09-27 DIAGNOSIS — Z888 Allergy status to other drugs, medicaments and biological substances status: Secondary | ICD-10-CM | POA: Diagnosis not present

## 2016-09-27 DIAGNOSIS — Z4821 Encounter for aftercare following heart transplant: Secondary | ICD-10-CM | POA: Diagnosis not present

## 2016-09-27 DIAGNOSIS — Z79899 Other long term (current) drug therapy: Secondary | ICD-10-CM | POA: Diagnosis not present

## 2016-09-27 DIAGNOSIS — N184 Chronic kidney disease, stage 4 (severe): Secondary | ICD-10-CM | POA: Diagnosis not present

## 2016-09-27 DIAGNOSIS — K8020 Calculus of gallbladder without cholecystitis without obstruction: Secondary | ICD-10-CM | POA: Diagnosis not present

## 2016-09-28 DIAGNOSIS — F17210 Nicotine dependence, cigarettes, uncomplicated: Secondary | ICD-10-CM | POA: Diagnosis not present

## 2016-09-28 DIAGNOSIS — F419 Anxiety disorder, unspecified: Secondary | ICD-10-CM | POA: Diagnosis not present

## 2016-09-28 DIAGNOSIS — K811 Chronic cholecystitis: Secondary | ICD-10-CM | POA: Diagnosis not present

## 2016-09-28 DIAGNOSIS — K432 Incisional hernia without obstruction or gangrene: Secondary | ICD-10-CM | POA: Diagnosis not present

## 2016-09-28 DIAGNOSIS — I129 Hypertensive chronic kidney disease with stage 1 through stage 4 chronic kidney disease, or unspecified chronic kidney disease: Secondary | ICD-10-CM | POA: Diagnosis not present

## 2016-09-28 DIAGNOSIS — Z886 Allergy status to analgesic agent status: Secondary | ICD-10-CM | POA: Diagnosis not present

## 2016-09-28 DIAGNOSIS — K219 Gastro-esophageal reflux disease without esophagitis: Secondary | ICD-10-CM | POA: Diagnosis not present

## 2016-09-28 DIAGNOSIS — F329 Major depressive disorder, single episode, unspecified: Secondary | ICD-10-CM | POA: Diagnosis not present

## 2016-09-28 DIAGNOSIS — I25811 Atherosclerosis of native coronary artery of transplanted heart without angina pectoris: Secondary | ICD-10-CM | POA: Diagnosis not present

## 2016-09-28 DIAGNOSIS — D509 Iron deficiency anemia, unspecified: Secondary | ICD-10-CM | POA: Diagnosis not present

## 2016-09-28 DIAGNOSIS — E785 Hyperlipidemia, unspecified: Secondary | ICD-10-CM | POA: Diagnosis not present

## 2016-09-28 DIAGNOSIS — N184 Chronic kidney disease, stage 4 (severe): Secondary | ICD-10-CM | POA: Diagnosis not present

## 2016-09-28 DIAGNOSIS — K8010 Calculus of gallbladder with chronic cholecystitis without obstruction: Secondary | ICD-10-CM | POA: Diagnosis not present

## 2016-09-28 DIAGNOSIS — Z888 Allergy status to other drugs, medicaments and biological substances status: Secondary | ICD-10-CM | POA: Diagnosis not present

## 2016-10-13 DIAGNOSIS — I10 Essential (primary) hypertension: Secondary | ICD-10-CM | POA: Diagnosis not present

## 2016-10-13 DIAGNOSIS — R109 Unspecified abdominal pain: Secondary | ICD-10-CM | POA: Diagnosis not present

## 2016-10-13 DIAGNOSIS — Z299 Encounter for prophylactic measures, unspecified: Secondary | ICD-10-CM | POA: Diagnosis not present

## 2016-10-13 DIAGNOSIS — K589 Irritable bowel syndrome without diarrhea: Secondary | ICD-10-CM | POA: Diagnosis not present

## 2016-10-13 DIAGNOSIS — I25811 Atherosclerosis of native coronary artery of transplanted heart without angina pectoris: Secondary | ICD-10-CM | POA: Diagnosis not present

## 2016-10-15 DIAGNOSIS — I10 Essential (primary) hypertension: Secondary | ICD-10-CM | POA: Diagnosis not present

## 2016-10-15 DIAGNOSIS — E7800 Pure hypercholesterolemia, unspecified: Secondary | ICD-10-CM | POA: Diagnosis not present

## 2016-10-18 DIAGNOSIS — N184 Chronic kidney disease, stage 4 (severe): Secondary | ICD-10-CM | POA: Diagnosis not present

## 2016-10-18 DIAGNOSIS — D702 Other drug-induced agranulocytosis: Secondary | ICD-10-CM | POA: Diagnosis not present

## 2016-10-18 DIAGNOSIS — Z941 Heart transplant status: Secondary | ICD-10-CM | POA: Diagnosis not present

## 2016-10-18 DIAGNOSIS — Z8719 Personal history of other diseases of the digestive system: Secondary | ICD-10-CM | POA: Diagnosis not present

## 2016-10-18 DIAGNOSIS — F17210 Nicotine dependence, cigarettes, uncomplicated: Secondary | ICD-10-CM | POA: Diagnosis not present

## 2016-10-18 DIAGNOSIS — I25811 Atherosclerosis of native coronary artery of transplanted heart without angina pectoris: Secondary | ICD-10-CM | POA: Diagnosis not present

## 2016-10-18 DIAGNOSIS — I129 Hypertensive chronic kidney disease with stage 1 through stage 4 chronic kidney disease, or unspecified chronic kidney disease: Secondary | ICD-10-CM | POA: Diagnosis not present

## 2016-10-18 DIAGNOSIS — Z79899 Other long term (current) drug therapy: Secondary | ICD-10-CM | POA: Diagnosis not present

## 2016-10-18 DIAGNOSIS — Z7289 Other problems related to lifestyle: Secondary | ICD-10-CM | POA: Diagnosis not present

## 2016-10-18 DIAGNOSIS — D508 Other iron deficiency anemias: Secondary | ICD-10-CM | POA: Diagnosis not present

## 2016-10-27 DIAGNOSIS — Z48815 Encounter for surgical aftercare following surgery on the digestive system: Secondary | ICD-10-CM | POA: Diagnosis not present

## 2016-10-27 DIAGNOSIS — K819 Cholecystitis, unspecified: Secondary | ICD-10-CM | POA: Diagnosis not present

## 2016-12-02 DIAGNOSIS — E7800 Pure hypercholesterolemia, unspecified: Secondary | ICD-10-CM | POA: Diagnosis not present

## 2016-12-02 DIAGNOSIS — I10 Essential (primary) hypertension: Secondary | ICD-10-CM | POA: Diagnosis not present

## 2017-01-26 DIAGNOSIS — F17210 Nicotine dependence, cigarettes, uncomplicated: Secondary | ICD-10-CM | POA: Diagnosis not present

## 2017-01-26 DIAGNOSIS — N2581 Secondary hyperparathyroidism of renal origin: Secondary | ICD-10-CM | POA: Diagnosis not present

## 2017-01-26 DIAGNOSIS — I129 Hypertensive chronic kidney disease with stage 1 through stage 4 chronic kidney disease, or unspecified chronic kidney disease: Secondary | ICD-10-CM | POA: Diagnosis not present

## 2017-01-26 DIAGNOSIS — E559 Vitamin D deficiency, unspecified: Secondary | ICD-10-CM | POA: Diagnosis not present

## 2017-01-26 DIAGNOSIS — I420 Dilated cardiomyopathy: Secondary | ICD-10-CM | POA: Diagnosis not present

## 2017-01-26 DIAGNOSIS — I130 Hypertensive heart and chronic kidney disease with heart failure and stage 1 through stage 4 chronic kidney disease, or unspecified chronic kidney disease: Secondary | ICD-10-CM | POA: Diagnosis not present

## 2017-01-26 DIAGNOSIS — I2510 Atherosclerotic heart disease of native coronary artery without angina pectoris: Secondary | ICD-10-CM | POA: Diagnosis not present

## 2017-01-26 DIAGNOSIS — Z720 Tobacco use: Secondary | ICD-10-CM | POA: Diagnosis not present

## 2017-01-26 DIAGNOSIS — N184 Chronic kidney disease, stage 4 (severe): Secondary | ICD-10-CM | POA: Diagnosis not present

## 2017-01-26 DIAGNOSIS — D631 Anemia in chronic kidney disease: Secondary | ICD-10-CM | POA: Diagnosis not present

## 2017-01-26 DIAGNOSIS — Z7982 Long term (current) use of aspirin: Secondary | ICD-10-CM | POA: Diagnosis not present

## 2017-01-26 DIAGNOSIS — Z941 Heart transplant status: Secondary | ICD-10-CM | POA: Diagnosis not present

## 2017-01-26 DIAGNOSIS — Z79899 Other long term (current) drug therapy: Secondary | ICD-10-CM | POA: Diagnosis not present

## 2017-01-26 DIAGNOSIS — I509 Heart failure, unspecified: Secondary | ICD-10-CM | POA: Diagnosis not present

## 2017-01-31 DIAGNOSIS — I10 Essential (primary) hypertension: Secondary | ICD-10-CM | POA: Diagnosis not present

## 2017-01-31 DIAGNOSIS — E7800 Pure hypercholesterolemia, unspecified: Secondary | ICD-10-CM | POA: Diagnosis not present

## 2017-02-08 DIAGNOSIS — I10 Essential (primary) hypertension: Secondary | ICD-10-CM | POA: Diagnosis not present

## 2017-02-08 DIAGNOSIS — Z941 Heart transplant status: Secondary | ICD-10-CM | POA: Diagnosis not present

## 2017-02-08 DIAGNOSIS — I25811 Atherosclerosis of native coronary artery of transplanted heart without angina pectoris: Secondary | ICD-10-CM | POA: Diagnosis not present

## 2017-02-08 DIAGNOSIS — W57XXXA Bitten or stung by nonvenomous insect and other nonvenomous arthropods, initial encounter: Secondary | ICD-10-CM | POA: Diagnosis not present

## 2017-02-08 DIAGNOSIS — F17210 Nicotine dependence, cigarettes, uncomplicated: Secondary | ICD-10-CM | POA: Diagnosis not present

## 2017-02-08 DIAGNOSIS — Z299 Encounter for prophylactic measures, unspecified: Secondary | ICD-10-CM | POA: Diagnosis not present

## 2017-02-08 DIAGNOSIS — E7800 Pure hypercholesterolemia, unspecified: Secondary | ICD-10-CM | POA: Diagnosis not present

## 2017-02-08 DIAGNOSIS — Z6821 Body mass index (BMI) 21.0-21.9, adult: Secondary | ICD-10-CM | POA: Diagnosis not present

## 2017-02-08 DIAGNOSIS — Z713 Dietary counseling and surveillance: Secondary | ICD-10-CM | POA: Diagnosis not present

## 2017-02-18 DIAGNOSIS — Z9049 Acquired absence of other specified parts of digestive tract: Secondary | ICD-10-CM | POA: Diagnosis not present

## 2017-02-18 DIAGNOSIS — F17210 Nicotine dependence, cigarettes, uncomplicated: Secondary | ICD-10-CM | POA: Diagnosis not present

## 2017-02-18 DIAGNOSIS — R1033 Periumbilical pain: Secondary | ICD-10-CM | POA: Diagnosis not present

## 2017-02-18 DIAGNOSIS — R109 Unspecified abdominal pain: Secondary | ICD-10-CM | POA: Diagnosis not present

## 2017-03-02 DIAGNOSIS — E7800 Pure hypercholesterolemia, unspecified: Secondary | ICD-10-CM | POA: Diagnosis not present

## 2017-03-02 DIAGNOSIS — I10 Essential (primary) hypertension: Secondary | ICD-10-CM | POA: Diagnosis not present

## 2017-03-09 DIAGNOSIS — R109 Unspecified abdominal pain: Secondary | ICD-10-CM | POA: Diagnosis not present

## 2017-03-30 DIAGNOSIS — I10 Essential (primary) hypertension: Secondary | ICD-10-CM | POA: Diagnosis not present

## 2017-03-30 DIAGNOSIS — E7800 Pure hypercholesterolemia, unspecified: Secondary | ICD-10-CM | POA: Diagnosis not present

## 2017-04-06 DIAGNOSIS — I2510 Atherosclerotic heart disease of native coronary artery without angina pectoris: Secondary | ICD-10-CM | POA: Diagnosis not present

## 2017-04-06 DIAGNOSIS — I130 Hypertensive heart and chronic kidney disease with heart failure and stage 1 through stage 4 chronic kidney disease, or unspecified chronic kidney disease: Secondary | ICD-10-CM | POA: Diagnosis not present

## 2017-04-06 DIAGNOSIS — Z888 Allergy status to other drugs, medicaments and biological substances status: Secondary | ICD-10-CM | POA: Diagnosis not present

## 2017-04-06 DIAGNOSIS — Z7982 Long term (current) use of aspirin: Secondary | ICD-10-CM | POA: Diagnosis not present

## 2017-04-06 DIAGNOSIS — Z886 Allergy status to analgesic agent status: Secondary | ICD-10-CM | POA: Diagnosis not present

## 2017-04-06 DIAGNOSIS — G894 Chronic pain syndrome: Secondary | ICD-10-CM | POA: Diagnosis not present

## 2017-04-06 DIAGNOSIS — I509 Heart failure, unspecified: Secondary | ICD-10-CM | POA: Diagnosis not present

## 2017-04-06 DIAGNOSIS — Z79899 Other long term (current) drug therapy: Secondary | ICD-10-CM | POA: Diagnosis not present

## 2017-04-06 DIAGNOSIS — N189 Chronic kidney disease, unspecified: Secondary | ICD-10-CM | POA: Diagnosis not present

## 2017-04-06 DIAGNOSIS — F17210 Nicotine dependence, cigarettes, uncomplicated: Secondary | ICD-10-CM | POA: Diagnosis not present

## 2017-04-06 DIAGNOSIS — G8929 Other chronic pain: Secondary | ICD-10-CM | POA: Diagnosis not present

## 2017-04-06 DIAGNOSIS — Z941 Heart transplant status: Secondary | ICD-10-CM | POA: Diagnosis not present

## 2017-04-06 DIAGNOSIS — R109 Unspecified abdominal pain: Secondary | ICD-10-CM | POA: Diagnosis not present

## 2017-04-20 DIAGNOSIS — Z941 Heart transplant status: Secondary | ICD-10-CM | POA: Diagnosis not present

## 2017-04-20 DIAGNOSIS — N183 Chronic kidney disease, stage 3 (moderate): Secondary | ICD-10-CM | POA: Diagnosis not present

## 2017-04-20 DIAGNOSIS — I129 Hypertensive chronic kidney disease with stage 1 through stage 4 chronic kidney disease, or unspecified chronic kidney disease: Secondary | ICD-10-CM | POA: Diagnosis not present

## 2017-04-20 DIAGNOSIS — Z7982 Long term (current) use of aspirin: Secondary | ICD-10-CM | POA: Diagnosis not present

## 2017-04-20 DIAGNOSIS — D508 Other iron deficiency anemias: Secondary | ICD-10-CM | POA: Diagnosis not present

## 2017-04-20 DIAGNOSIS — Z720 Tobacco use: Secondary | ICD-10-CM | POA: Diagnosis not present

## 2017-04-20 DIAGNOSIS — R002 Palpitations: Secondary | ICD-10-CM | POA: Diagnosis not present

## 2017-04-20 DIAGNOSIS — Z79899 Other long term (current) drug therapy: Secondary | ICD-10-CM | POA: Diagnosis not present

## 2017-05-12 DIAGNOSIS — R109 Unspecified abdominal pain: Secondary | ICD-10-CM | POA: Diagnosis not present

## 2017-05-12 DIAGNOSIS — G8929 Other chronic pain: Secondary | ICD-10-CM | POA: Diagnosis not present

## 2017-05-13 DIAGNOSIS — R002 Palpitations: Secondary | ICD-10-CM | POA: Diagnosis not present

## 2017-05-19 DIAGNOSIS — I10 Essential (primary) hypertension: Secondary | ICD-10-CM | POA: Diagnosis not present

## 2017-05-19 DIAGNOSIS — E7800 Pure hypercholesterolemia, unspecified: Secondary | ICD-10-CM | POA: Diagnosis not present

## 2017-06-10 DIAGNOSIS — E7800 Pure hypercholesterolemia, unspecified: Secondary | ICD-10-CM | POA: Diagnosis not present

## 2017-06-10 DIAGNOSIS — I10 Essential (primary) hypertension: Secondary | ICD-10-CM | POA: Diagnosis not present

## 2017-06-14 DIAGNOSIS — F17200 Nicotine dependence, unspecified, uncomplicated: Secondary | ICD-10-CM | POA: Diagnosis not present

## 2017-06-14 DIAGNOSIS — G8929 Other chronic pain: Secondary | ICD-10-CM | POA: Diagnosis not present

## 2017-06-14 DIAGNOSIS — R109 Unspecified abdominal pain: Secondary | ICD-10-CM | POA: Diagnosis not present

## 2017-06-15 DIAGNOSIS — R5383 Other fatigue: Secondary | ICD-10-CM | POA: Diagnosis not present

## 2017-06-15 DIAGNOSIS — F17210 Nicotine dependence, cigarettes, uncomplicated: Secondary | ICD-10-CM | POA: Diagnosis not present

## 2017-06-15 DIAGNOSIS — I25811 Atherosclerosis of native coronary artery of transplanted heart without angina pectoris: Secondary | ICD-10-CM | POA: Diagnosis not present

## 2017-06-15 DIAGNOSIS — Z6821 Body mass index (BMI) 21.0-21.9, adult: Secondary | ICD-10-CM | POA: Diagnosis not present

## 2017-06-15 DIAGNOSIS — I10 Essential (primary) hypertension: Secondary | ICD-10-CM | POA: Diagnosis not present

## 2017-06-15 DIAGNOSIS — N183 Chronic kidney disease, stage 3 (moderate): Secondary | ICD-10-CM | POA: Diagnosis not present

## 2017-06-15 DIAGNOSIS — Z79899 Other long term (current) drug therapy: Secondary | ICD-10-CM | POA: Diagnosis not present

## 2017-06-15 DIAGNOSIS — D649 Anemia, unspecified: Secondary | ICD-10-CM | POA: Diagnosis not present

## 2017-06-15 DIAGNOSIS — Z0000 Encounter for general adult medical examination without abnormal findings: Secondary | ICD-10-CM | POA: Diagnosis not present

## 2017-06-15 DIAGNOSIS — Z941 Heart transplant status: Secondary | ICD-10-CM | POA: Diagnosis not present

## 2017-06-15 DIAGNOSIS — E7800 Pure hypercholesterolemia, unspecified: Secondary | ICD-10-CM | POA: Diagnosis not present

## 2017-06-15 DIAGNOSIS — Z1389 Encounter for screening for other disorder: Secondary | ICD-10-CM | POA: Diagnosis not present

## 2017-06-15 DIAGNOSIS — Z7189 Other specified counseling: Secondary | ICD-10-CM | POA: Diagnosis not present

## 2017-06-15 DIAGNOSIS — Z299 Encounter for prophylactic measures, unspecified: Secondary | ICD-10-CM | POA: Diagnosis not present

## 2017-06-23 DIAGNOSIS — R1084 Generalized abdominal pain: Secondary | ICD-10-CM | POA: Diagnosis not present

## 2017-07-13 DIAGNOSIS — E7800 Pure hypercholesterolemia, unspecified: Secondary | ICD-10-CM | POA: Diagnosis not present

## 2017-07-13 DIAGNOSIS — I10 Essential (primary) hypertension: Secondary | ICD-10-CM | POA: Diagnosis not present

## 2017-07-29 DIAGNOSIS — E7800 Pure hypercholesterolemia, unspecified: Secondary | ICD-10-CM | POA: Diagnosis not present

## 2017-07-29 DIAGNOSIS — H00019 Hordeolum externum unspecified eye, unspecified eyelid: Secondary | ICD-10-CM | POA: Diagnosis not present

## 2017-07-29 DIAGNOSIS — I25811 Atherosclerosis of native coronary artery of transplanted heart without angina pectoris: Secondary | ICD-10-CM | POA: Diagnosis not present

## 2017-07-29 DIAGNOSIS — I10 Essential (primary) hypertension: Secondary | ICD-10-CM | POA: Diagnosis not present

## 2017-07-29 DIAGNOSIS — Z681 Body mass index (BMI) 19 or less, adult: Secondary | ICD-10-CM | POA: Diagnosis not present

## 2017-07-29 DIAGNOSIS — Z941 Heart transplant status: Secondary | ICD-10-CM | POA: Diagnosis not present

## 2017-07-29 DIAGNOSIS — Z299 Encounter for prophylactic measures, unspecified: Secondary | ICD-10-CM | POA: Diagnosis not present

## 2017-08-08 DIAGNOSIS — E7800 Pure hypercholesterolemia, unspecified: Secondary | ICD-10-CM | POA: Diagnosis not present

## 2017-08-08 DIAGNOSIS — I10 Essential (primary) hypertension: Secondary | ICD-10-CM | POA: Diagnosis not present

## 2017-08-10 DIAGNOSIS — D509 Iron deficiency anemia, unspecified: Secondary | ICD-10-CM | POA: Diagnosis not present

## 2017-08-10 DIAGNOSIS — D631 Anemia in chronic kidney disease: Secondary | ICD-10-CM | POA: Diagnosis not present

## 2017-08-10 DIAGNOSIS — Z4821 Encounter for aftercare following heart transplant: Secondary | ICD-10-CM | POA: Diagnosis not present

## 2017-08-10 DIAGNOSIS — Z79899 Other long term (current) drug therapy: Secondary | ICD-10-CM | POA: Diagnosis not present

## 2017-08-10 DIAGNOSIS — R102 Pelvic and perineal pain: Secondary | ICD-10-CM | POA: Diagnosis not present

## 2017-08-10 DIAGNOSIS — N183 Chronic kidney disease, stage 3 (moderate): Secondary | ICD-10-CM | POA: Diagnosis not present

## 2017-08-10 DIAGNOSIS — Z9049 Acquired absence of other specified parts of digestive tract: Secondary | ICD-10-CM | POA: Diagnosis not present

## 2017-08-10 DIAGNOSIS — R002 Palpitations: Secondary | ICD-10-CM | POA: Diagnosis not present

## 2017-08-10 DIAGNOSIS — I129 Hypertensive chronic kidney disease with stage 1 through stage 4 chronic kidney disease, or unspecified chronic kidney disease: Secondary | ICD-10-CM | POA: Diagnosis not present

## 2017-08-10 DIAGNOSIS — F17210 Nicotine dependence, cigarettes, uncomplicated: Secondary | ICD-10-CM | POA: Diagnosis not present

## 2017-08-10 DIAGNOSIS — Z7982 Long term (current) use of aspirin: Secondary | ICD-10-CM | POA: Diagnosis not present

## 2017-08-10 DIAGNOSIS — R634 Abnormal weight loss: Secondary | ICD-10-CM | POA: Diagnosis not present

## 2017-08-10 DIAGNOSIS — Z941 Heart transplant status: Secondary | ICD-10-CM | POA: Diagnosis not present

## 2017-08-10 DIAGNOSIS — R1030 Lower abdominal pain, unspecified: Secondary | ICD-10-CM | POA: Diagnosis not present

## 2017-08-15 DIAGNOSIS — Z941 Heart transplant status: Secondary | ICD-10-CM | POA: Diagnosis not present

## 2017-08-15 DIAGNOSIS — Z4821 Encounter for aftercare following heart transplant: Secondary | ICD-10-CM | POA: Diagnosis not present

## 2017-08-19 DIAGNOSIS — L738 Other specified follicular disorders: Secondary | ICD-10-CM | POA: Diagnosis not present

## 2017-08-19 DIAGNOSIS — Z85828 Personal history of other malignant neoplasm of skin: Secondary | ICD-10-CM | POA: Diagnosis not present

## 2017-08-19 DIAGNOSIS — L720 Epidermal cyst: Secondary | ICD-10-CM | POA: Diagnosis not present

## 2017-08-19 DIAGNOSIS — A630 Anogenital (venereal) warts: Secondary | ICD-10-CM | POA: Diagnosis not present

## 2017-08-19 DIAGNOSIS — Z941 Heart transplant status: Secondary | ICD-10-CM | POA: Diagnosis not present

## 2017-09-04 ENCOUNTER — Emergency Department (HOSPITAL_COMMUNITY): Payer: Medicare Other

## 2017-09-04 ENCOUNTER — Emergency Department (HOSPITAL_COMMUNITY)
Admission: EM | Admit: 2017-09-04 | Discharge: 2017-09-04 | Disposition: A | Discharge status: Home / Self Care | Payer: Medicare Other | Attending: Emergency Medicine | Admitting: Emergency Medicine

## 2017-09-04 ENCOUNTER — Encounter (HOSPITAL_COMMUNITY): Payer: Self-pay

## 2017-09-04 DIAGNOSIS — Z941 Heart transplant status: Secondary | ICD-10-CM | POA: Diagnosis not present

## 2017-09-04 DIAGNOSIS — Y9389 Activity, other specified: Secondary | ICD-10-CM | POA: Diagnosis not present

## 2017-09-04 DIAGNOSIS — Y999 Unspecified external cause status: Secondary | ICD-10-CM | POA: Insufficient documentation

## 2017-09-04 DIAGNOSIS — W01198A Fall on same level from slipping, tripping and stumbling with subsequent striking against other object, initial encounter: Secondary | ICD-10-CM | POA: Diagnosis not present

## 2017-09-04 DIAGNOSIS — I10 Essential (primary) hypertension: Secondary | ICD-10-CM | POA: Insufficient documentation

## 2017-09-04 DIAGNOSIS — F17210 Nicotine dependence, cigarettes, uncomplicated: Secondary | ICD-10-CM | POA: Diagnosis not present

## 2017-09-04 DIAGNOSIS — S60221A Contusion of right hand, initial encounter: Secondary | ICD-10-CM

## 2017-09-04 DIAGNOSIS — M79641 Pain in right hand: Secondary | ICD-10-CM | POA: Diagnosis not present

## 2017-09-04 DIAGNOSIS — Y929 Unspecified place or not applicable: Secondary | ICD-10-CM | POA: Insufficient documentation

## 2017-09-04 DIAGNOSIS — S6991XA Unspecified injury of right wrist, hand and finger(s), initial encounter: Secondary | ICD-10-CM | POA: Diagnosis present

## 2017-09-04 HISTORY — DX: Essential (primary) hypertension: I10

## 2017-09-04 HISTORY — DX: Heart transplant status: Z94.1

## 2017-09-04 MED ORDER — TRAMADOL HCL 50 MG PO TABS: 50.0000 mg | ORAL_TABLET | Freq: Four times a day (QID) | ORAL | 0 refills | Status: DC | PRN

## 2017-09-04 NOTE — ED Triage Notes (Signed)
Patient reports of right hand pain due to hitting wall Friday. Swelling noted.

## 2017-09-04 NOTE — Discharge Instructions (Signed)
Elevate your hand when possible.  Call the orthopedic doctor listed to arrange a follow-up appt in one week if the symptoms are not improving

## 2017-09-04 NOTE — ED Provider Notes (Signed)
[facility] EMERGENCY DEPARTMENT Provider Note   CSN: [id number] Arrival date & time: 09/04/17  1123     History   Chief Complaint Chief Complaint  Patient presents with  . Hand Injury    HPI Antonio Powell is a 42 y.o. male.  HPI   Antonio Powell is a 41 y.o. male who presents to the Emergency Department complaining of pain and swelling of the right hand for 2 days.  States that he accidentally struck a concrete wall while reaching to pick up something.  He complains of continued pain with movement of the middle, ring and little fingers and swelling to the back of his hand.  Has hx of previous 5th metacarpal fx as a teenager.  Denies numbness of the fingers, open wounds, wrist pain and other injuries.  Movement makes the pain worse, nothing makes it better  Past Medical History:  Diagnosis Date  . Heart transplant recipient [facility])   . Hypertension     There are no active problems to display for this patient.   Past Surgical History:  Procedure Laterality Date  . APPENDECTOMY    . CARDIAC SURGERY    . HERNIA REPAIR    . left ear         Home Medications    Prior to Admission medications   Medication Sig Start Date End Date Taking? Authorizing Provider  metroNIDAZOLE (FLAGYL) 500 MG tablet Take 1 tablet (500 mg total) by mouth 2 (two) times daily. 07/05/16   Jonnie Kind, MD    Family History No family history on file.  Social History Social History  Substance Use Topics  . Smoking status: Current Every Day Smoker    Packs/day: 0.50    Types: Cigarettes  . Smokeless tobacco: Never Used  . Alcohol use Yes     Comment: occ     Allergies   Nsaids and Phenergan [promethazine hcl]   Review of Systems Review of Systems  Constitutional: Negative for chills and fever.  Musculoskeletal: Positive for arthralgias (right hand pain) and joint swelling. Negative for neck pain.  Skin: Negative for color change and wound.  Neurological: Negative  for weakness and numbness.  All other systems reviewed and are negative.    Physical Exam Updated Vital Signs BP (!) 145/79   Pulse 89   Temp 98.6 F (37 C) (Temporal)   Resp 16   Ht 5\' 11"  (1.803 m)   Wt 61.2 kg (135 lb)   SpO2 100%   BMI 18.83 kg/m   Physical Exam  Constitutional: He is oriented to person, place, and time. He appears well-developed and well-nourished. No distress.  HENT:  Head: Normocephalic and atraumatic.  Cardiovascular: Normal rate, regular rhythm and intact distal pulses.   Pulmonary/Chest: Effort normal and breath sounds normal.  Musculoskeletal: He exhibits edema and tenderness. He exhibits no deformity.  ttp of the ulnar aspect of the right hand.  Mild to moderate edema with ecchymosis to the palmar aspect and proximal right fifth finger.    CR< 2 sec.  No bony deformity.  Patient has full ROM. No proximal tenderness  Neurological: He is alert and oriented to person, place, and time. He exhibits normal muscle tone. Coordination normal.  Skin: Skin is warm and dry. Capillary refill takes less than 2 seconds.  No open wounds of the right hand  Psychiatric: He has a normal mood and affect.  Nursing note and vitals reviewed.    ED Treatments / Results  Labs (all labs ordered are listed, but only abnormal results are displayed) Labs Reviewed - No data to display  EKG  EKG Interpretation None       Radiology Dg Hand Complete Right  Result Date: 09/04/2017 CLINICAL DATA:  Right hand pain and swelling after hitting a wall on Friday. EXAM: RIGHT HAND - COMPLETE 3+ VIEW COMPARISON:  None. FINDINGS: No osseous fracture line or displaced fracture fragment seen. No joint space dislocation seen. Adjacent soft tissues are unremarkable. IMPRESSION: Negative. Electronically Signed   By: Franki Cabot M.D.   On: 09/04/2017 13:13    Procedures Procedures (including critical care time)  Medications Ordered in ED Medications - No data to  display   Initial Impression / Assessment and Plan / ED Course  I have reviewed the triage vital signs and the nursing notes.  Pertinent labs & imaging results that were available during my care of the patient were reviewed by me and considered in my medical decision making (see chart for details).     NV intact.   XR of hand neg for fx.   Bulky Jones dressing applied by nursing.  Pt agrees to RICE therapy and orthopedic f/u  Final Clinical Impressions(s) / ED Diagnoses   Final diagnoses:  Contusion of right hand, initial encounter    New Prescriptions New Prescriptions   No medications on file     Kem Parkinson, Hershal Coria 09/04/17 2152    Francine Graven, DO 09/07/17 1340

## 2017-09-13 DIAGNOSIS — A630 Anogenital (venereal) warts: Secondary | ICD-10-CM | POA: Diagnosis not present

## 2017-09-21 DIAGNOSIS — A630 Anogenital (venereal) warts: Secondary | ICD-10-CM | POA: Diagnosis not present

## 2017-09-21 DIAGNOSIS — R85613 High grade squamous intraepithelial lesion on cytologic smear of anus (HGSIL): Secondary | ICD-10-CM | POA: Diagnosis not present

## 2017-09-23 DIAGNOSIS — N184 Chronic kidney disease, stage 4 (severe): Secondary | ICD-10-CM | POA: Diagnosis not present

## 2017-09-28 DIAGNOSIS — N184 Chronic kidney disease, stage 4 (severe): Secondary | ICD-10-CM | POA: Diagnosis not present

## 2017-09-28 DIAGNOSIS — Z941 Heart transplant status: Secondary | ICD-10-CM | POA: Diagnosis not present

## 2017-09-28 DIAGNOSIS — I5042 Chronic combined systolic (congestive) and diastolic (congestive) heart failure: Secondary | ICD-10-CM | POA: Diagnosis not present

## 2017-09-28 DIAGNOSIS — I10 Essential (primary) hypertension: Secondary | ICD-10-CM | POA: Diagnosis not present

## 2017-09-28 DIAGNOSIS — I351 Nonrheumatic aortic (valve) insufficiency: Secondary | ICD-10-CM | POA: Diagnosis not present

## 2017-10-06 DIAGNOSIS — Z941 Heart transplant status: Secondary | ICD-10-CM | POA: Diagnosis not present

## 2017-10-07 DIAGNOSIS — A630 Anogenital (venereal) warts: Secondary | ICD-10-CM | POA: Diagnosis not present

## 2017-10-07 DIAGNOSIS — L720 Epidermal cyst: Secondary | ICD-10-CM | POA: Diagnosis not present

## 2017-10-07 DIAGNOSIS — L738 Other specified follicular disorders: Secondary | ICD-10-CM | POA: Diagnosis not present

## 2017-10-11 DIAGNOSIS — D013 Carcinoma in situ of anus and anal canal: Secondary | ICD-10-CM | POA: Insufficient documentation

## 2017-10-21 DIAGNOSIS — E7800 Pure hypercholesterolemia, unspecified: Secondary | ICD-10-CM | POA: Diagnosis not present

## 2017-10-21 DIAGNOSIS — I10 Essential (primary) hypertension: Secondary | ICD-10-CM | POA: Diagnosis not present

## 2017-10-31 ENCOUNTER — Emergency Department (HOSPITAL_COMMUNITY): Payer: Medicare Other

## 2017-10-31 ENCOUNTER — Encounter (HOSPITAL_COMMUNITY): Payer: Self-pay | Admitting: *Deleted

## 2017-10-31 ENCOUNTER — Emergency Department (HOSPITAL_COMMUNITY)
Admission: EM | Admit: 2017-10-31 | Discharge: 2017-10-31 | Disposition: A | Discharge status: Home / Self Care | Payer: Medicare Other | Attending: Emergency Medicine | Admitting: Emergency Medicine

## 2017-10-31 ENCOUNTER — Other Ambulatory Visit: Payer: Self-pay

## 2017-10-31 DIAGNOSIS — I10 Essential (primary) hypertension: Secondary | ICD-10-CM | POA: Diagnosis not present

## 2017-10-31 DIAGNOSIS — F17210 Nicotine dependence, cigarettes, uncomplicated: Secondary | ICD-10-CM | POA: Diagnosis not present

## 2017-10-31 DIAGNOSIS — R05 Cough: Secondary | ICD-10-CM | POA: Diagnosis not present

## 2017-10-31 DIAGNOSIS — H9203 Otalgia, bilateral: Secondary | ICD-10-CM | POA: Diagnosis present

## 2017-10-31 DIAGNOSIS — Z79899 Other long term (current) drug therapy: Secondary | ICD-10-CM | POA: Diagnosis not present

## 2017-10-31 DIAGNOSIS — J189 Pneumonia, unspecified organism: Secondary | ICD-10-CM

## 2017-10-31 LAB — CBC WITH DIFFERENTIAL/PLATELET
Basophils Absolute: 0 10*3/uL (ref 0.0–0.1)
Basophils Relative: 0 %
EOS PCT: 1 %
Eosinophils Absolute: 0.1 10*3/uL (ref 0.0–0.7)
HEMATOCRIT: 30.7 % — AB (ref 39.0–52.0)
Hemoglobin: 10.5 g/dL — ABNORMAL LOW (ref 13.0–17.0)
LYMPHS ABS: 3.1 10*3/uL (ref 0.7–4.0)
LYMPHS PCT: 35 %
MCH: 25.9 pg — ABNORMAL LOW (ref 26.0–34.0)
MCHC: 34.2 g/dL (ref 30.0–36.0)
MCV: 75.8 fL — AB (ref 78.0–100.0)
MONO ABS: 0.7 10*3/uL (ref 0.1–1.0)
Monocytes Relative: 8 %
Neutro Abs: 5.1 10*3/uL (ref 1.7–7.7)
Neutrophils Relative %: 56 %
Platelets: 122 10*3/uL — ABNORMAL LOW (ref 150–400)
RBC: 4.05 MIL/uL — ABNORMAL LOW (ref 4.22–5.81)
RDW: 17.8 % — AB (ref 11.5–15.5)
WBC: 9.1 10*3/uL (ref 4.0–10.5)
nRBC: 0 /100 WBC

## 2017-10-31 LAB — BASIC METABOLIC PANEL
Anion gap: 10 (ref 5–15)
BUN: 47 mg/dL — ABNORMAL HIGH (ref 6–20)
CALCIUM: 8.8 mg/dL — AB (ref 8.9–10.3)
CO2: 21 mmol/L — AB (ref 22–32)
CREATININE: 3.71 mg/dL — AB (ref 0.61–1.24)
Chloride: 107 mmol/L (ref 101–111)
GFR calc Af Amer: 22 mL/min — ABNORMAL LOW (ref >60)
GFR calc non Af Amer: 19 mL/min — ABNORMAL LOW (ref >60)
GLUCOSE: 108 mg/dL — AB (ref 65–99)
Potassium: 4.7 mmol/L (ref 3.5–5.1)
Sodium: 138 mmol/L (ref 135–145)

## 2017-10-31 LAB — RAPID STREP SCREEN (MED CTR MEBANE ONLY): Streptococcus, Group A Screen (Direct): NEGATIVE

## 2017-10-31 MED ORDER — AMOXICILLIN 500 MG PO CAPS: 500.0000 mg | ORAL_CAPSULE | Freq: Three times a day (TID) | ORAL | 0 refills | Status: DC

## 2017-10-31 MED ORDER — AZITHROMYCIN 250 MG PO TABS: ORAL_TABLET | ORAL | 0 refills | Status: DC

## 2017-10-31 MED ORDER — LIDOCAINE HCL (PF) 1 % IJ SOLN
INTRAMUSCULAR | Status: AC
  Filled 2017-10-31: qty 2

## 2017-10-31 MED ORDER — AZITHROMYCIN 250 MG PO TABS
500.0000 mg | ORAL_TABLET | Freq: Once | ORAL | Status: AC
  Administered 2017-10-31: 500 mg via ORAL
  Filled 2017-10-31: qty 2

## 2017-10-31 MED ORDER — CEFTRIAXONE SODIUM 1 G IJ SOLR
1.0000 g | Freq: Once | INTRAMUSCULAR | Status: AC
  Administered 2017-10-31: 1 g via INTRAMUSCULAR
  Filled 2017-10-31: qty 10

## 2017-10-31 NOTE — ED Notes (Signed)
Ambulated pt with pulse oximetry started at 99 and stayed at 99 the entire time,complete circle around nursing station

## 2017-10-31 NOTE — Discharge Instructions (Signed)
Drink plenty of fluids.  Take the antibiotics as directed.  I talked to the transplant doctor on call tonight at [facility].  They say if you get worse or if you are not feeling better in the next 24 hours to call the transplant nurse to let her know.

## 2017-10-31 NOTE — ED Provider Notes (Addendum)
[facility] EMERGENCY DEPARTMENT Provider Note   CSN: [id number] Arrival date & time: 10/31/17  0024  Time seen 03:10 AM  History   Chief Complaint Chief Complaint  Patient presents with  . Otalgia    HPI Antonio Powell is a 41 y.o. male.  HPI patient states 2 days ago he started having a sore throat stating it hurts to swallow and pain in both his ears.  He states he thinks he had a fever up to 99.3.  He has had chills and a mild cough and states he is coughing up yellow sputum.  He states he started noticing some wheezing a few months ago.  He denies nausea, vomiting, or diarrhea.  He states he was around somebody else with URI symptoms a few days ago and he tried to quickly get away from them.  Patient states he had a heart transplant in 2001 and he is taking his immunosuppressive's.  PCP Medicine, [facility]  Transplant team at [facility]  Past Medical History:  Diagnosis Date  . Heart transplant recipient [facility])   . Hypertension     There are no active problems to display for this patient.   Past Surgical History:  Procedure Laterality Date  . APPENDECTOMY    . CARDIAC SURGERY    . CHOLECYSTECTOMY    . HERNIA REPAIR    . left ear         Home Medications    Prior to Admission medications   Medication Sig Start Date End Date Taking? Authorizing Provider  allopurinol (ZYLOPRIM) 100 MG tablet Take 100 mg by mouth daily.   Yes [provider]  aspirin 81 MG chewable tablet Chew 81 mg by mouth daily.   Yes [provider]  CARVEDILOL PO Take by mouth 3 (three) times daily.   Yes [provider]  diltiazem (TIAZAC) 360 MG 24 hr capsule Take 360 mg by mouth daily.   Yes [provider]  folic acid (FOLVITE) 1 MG tablet Take 1 mg by mouth daily.   Yes [provider]  furosemide (LASIX) 20 MG tablet Take 20 mg by mouth daily.   Yes [provider]  hydrALAZINE (APRESOLINE) 50 MG tablet Take 50 mg by  mouth 3 (three) times daily.   Yes [provider]  iron polysaccharides (NIFEREX) 150 MG capsule Take 150 mg by mouth daily.   Yes [provider]  linaclotide (LINZESS) 145 MCG CAPS capsule Take 145 mcg by mouth daily before breakfast.   Yes [provider]  magnesium oxide (MAG-OX) 400 MG tablet Take 400 mg by mouth daily.   Yes [provider]  Multiple Vitamins-Minerals (MULTIVITAMIN ADULT) TABS Take 1 tablet by mouth daily.   Yes [provider]  pravastatin (PRAVACHOL) 40 MG tablet Take 40 mg by mouth daily.   Yes [provider]  ranitidine (ZANTAC) 150 MG tablet Take 150 mg by mouth 2 (two) times daily.   Yes [provider]  sirolimus (RAPAMUNE) 1 MG tablet Take 1 mg by mouth daily.   Yes [provider]  tacrolimus (PROGRAF) 1 MG capsule Take 1 mg by mouth 2 (two) times daily.   Yes [provider]  amoxicillin (AMOXIL) 500 MG capsule Take 1 capsule (500 mg total) by mouth 3 (three) times daily. 10/31/17   Rolland Porter, MD  azithromycin (ZITHROMAX) 250 MG tablet Take 1 po QD starting on Dec 25 10/31/17   Rolland Porter, MD  metroNIDAZOLE (FLAGYL) 500  MG tablet Take 1 tablet (500 mg total) by mouth 2 (two) times daily. 07/05/16   Jonnie Kind, MD  traMADol (ULTRAM) 50 MG tablet Take 1 tablet (50 mg total) by mouth every 6 (six) hours as needed. 09/04/17   Kem Parkinson, PA-C    Family History History reviewed. No pertinent family history.  Social History Social History   Tobacco Use  . Smoking status: Current Every Day Smoker    Packs/day: 0.50    Types: Cigarettes  . Smokeless tobacco: Never Used  Substance Use Topics  . Alcohol use: Yes    Comment: occ  . Drug use: Yes    Types: Marijuana     Allergies   Nsaids and Phenergan [promethazine hcl]   Review of Systems Review of Systems  All other systems reviewed and are negative.    Physical Exam Updated Vital Signs BP 106/63 (BP  Location: Right Arm)   Temp 97.6 F (36.4 C) (Oral)   Resp 17   Ht 5\' 11"  (1.803 m)   Wt 63.5 kg (140 lb)   SpO2 96%   BMI 19.53 kg/m   Vital signs normal    Physical Exam  Constitutional: He is oriented to person, place, and time. He appears well-developed and well-nourished.  Non-toxic appearance. He does not appear ill. No distress.  HENT:  Head: Normocephalic and atraumatic.  Right Ear: Tympanic membrane, external ear and ear canal normal.  Left Ear: Tympanic membrane, external ear and ear canal normal.  Nose: Nose normal. No mucosal edema or rhinorrhea.  Mouth/Throat: Oropharynx is clear and moist and mucous membranes are normal. No dental abscesses or uvula swelling.  I do not see his tonsils, they are not enlarged, there is no increased redness, swelling of the soft tissues or purulence seen.  His voice is normal.  Patient has some scarring of his tympanic membranes inferiorly and posteriorly bilaterally left worse than the right, he states he has had surgery on his eardrum before.  Eyes: Conjunctivae and EOM are normal. Pupils are equal, round, and reactive to light.  Neck: Normal range of motion and full passive range of motion without pain. Neck supple.  Cardiovascular: Normal rate, regular rhythm and normal heart sounds. Exam reveals no gallop and no friction rub.  No murmur heard. Pulmonary/Chest: Effort normal. No respiratory distress. He has wheezes in the right lower field. He has no rhonchi. He has no rales. He exhibits no tenderness and no crepitus.  Patient noted to have some isolated wheeze in the right lower lung.  There is no rales or rhonchi.  He was noted to cough infrequently.  Abdominal: Soft. Normal appearance and bowel sounds are normal. He exhibits no distension. There is no tenderness. There is no rebound and no guarding.  Musculoskeletal: Normal range of motion. He exhibits no edema or tenderness.  Moves all extremities well.   Lymphadenopathy:    He has  cervical adenopathy.  Neurological: He is alert and oriented to person, place, and time. He has normal strength. No cranial nerve deficit.  Skin: Skin is warm, dry and intact. No rash noted. No erythema. No pallor.  Psychiatric: He has a normal mood and affect. His speech is normal and behavior is normal. His mood appears not anxious.  Nursing note and vitals reviewed.    ED Treatments / Results  Labs (all labs ordered are listed, but only abnormal results are displayed) Results for orders placed or performed during the hospital encounter of 10/31/17  Rapid  strep screen  Result Value Ref Range   Streptococcus, Group A Screen (Direct) NEGATIVE NEGATIVE  CBC with Differential  Result Value Ref Range   WBC 9.1 4.0 - 10.5 K/uL   RBC 4.05 (L) 4.22 - 5.81 MIL/uL   Hemoglobin 10.5 (L) 13.0 - 17.0 g/dL   HCT 30.7 (L) 39.0 - 52.0 %   MCV 75.8 (L) 78.0 - 100.0 fL   MCH 25.9 (L) 26.0 - 34.0 pg   MCHC 34.2 30.0 - 36.0 g/dL   RDW 17.8 (H) 11.5 - 15.5 %   Platelets 122 150 - 400 K/uL   Neutrophils Relative % 56 %   Neutro Abs 5.1 1.7 - 7.7 K/uL   Lymphocytes Relative 35 %   Lymphs Abs 3.1 0.7 - 4.0 K/uL   Monocytes Relative 8 %   Monocytes Absolute 0.7 0.1 - 1.0 K/uL   Eosinophils Relative 1 %   Eosinophils Absolute 0.1 0.0 - 0.7 K/uL   Basophils Relative 0 %   Basophils Absolute 0.0 0.0 - 0.1 K/uL  Basic metabolic panel  Result Value Ref Range   Sodium 138 135 - 145 mmol/L   Potassium 4.7 3.5 - 5.1 mmol/L   Chloride 107 101 - 111 mmol/L   CO2 21 (L) 22 - 32 mmol/L   Glucose, Bld 108 (H) 65 - 99 mg/dL   BUN 47 (H) 6 - 20 mg/dL   Creatinine, Ser 3.71 (H) 0.61 - 1.24 mg/dL   Calcium 8.8 (L) 8.9 - 10.3 mg/dL   GFR calc non Af Amer 19 (L) >60 mL/min   GFR calc Af Amer 22 (L) >60 mL/min   Anion gap 10 5 - 15   Laboratory interpretation all normal except renal insufficiency, mild anemia    EKG  EKG Interpretation None       Radiology Dg Chest 2 View  Result Date:  10/31/2017 CLINICAL DATA:  41 year old male with fever and cough. Patient is on immunosuppression for heart transplant. EXAM: CHEST  2 VIEW COMPARISON:  Chest radiograph dated 01/23/2014 FINDINGS: There is hyperexpansion of the lungs with flattening of the diaphragms. There is bronchitic changes. Diffuse peribronchial and interstitial densities predominantly involving the mid to lower lung field noted which may be chronic. An atypical pneumonia is not excluded. Clinical correlation is recommended. No focal consolidation, pleural effusion, or pneumothorax. Median sternotomy wires compatible with history of heart transplant. There is overall slight increase in the cardiac size compared to the study of 2015 with prominence of the right atrium. Correlation with clinical exam and echocardiogram recommended. No acute osseous pathology. IMPRESSION: 1. Bronchitic changes with mid to lower lobe predominant interstitial and peribronchial densities. An atypical pneumonia is not entirely excluded. Clinical correlation is recommended. 2. Slight interval increase in the size of the heart and right atrial compared to the radiograph of 2015. Correlation with clinical exam and echocardiogram recommended. Electronically Signed   By: Anner Crete M.D.   On: 10/31/2017 04:03    Procedures Procedures (including critical care time)  Medications Ordered in ED Medications  lidocaine (PF) (XYLOCAINE) 1 % injection (not administered)  cefTRIAXone (ROCEPHIN) injection 1 g (1 g Intramuscular Given 10/31/17 0452)  azithromycin (ZITHROMAX) tablet 500 mg (500 mg Oral Given 10/31/17 0453)     Initial Impression / Assessment and Plan / ED Course  I have reviewed the triage vital signs and the nursing notes.  Pertinent labs & imaging results that were available during my care of the patient were reviewed by me and  considered in my medical decision making (see chart for details).    Chest x-ray was done, patient states he has  had pneumonia in the past without symptoms.  A strep screen was done, his throat did not look abnormal to me but he does have some cervical lymphadenopathy.  5 AM we discussed his chest x-ray results.  Patient states he feels like he can go home, he does not feel like he needs to be admitted.  Nursing staff ambulated patient and his pulse ox remained 99%.  Patient was given Rocephin IM and started on oral Zithromax.  He will be discharged home on 2 antibiotics, amoxicillin and a Z-Pak.  He was strongly advised if he feels worse to return to the emergency room for admission.  When I look in care everywhere his white blood cell counts are always normal in the 8-10.5 range.  The last was November 21 when it was 9.1.  He has a mild stable anemia of 10.6-11.7, it was 10.6 on November 21.  His BNP was 756 on November 29.  His FK506 level was 6.7 on November 29 which was therapeutic.  His  creatinine has ranged from 2.81-3.4.  He was last seen by Dr. Davina Poke, cardiac transplant team, his next appointment is December 28 for stress test.  06:12 AM Dr Alric Ran, Transplant on call at [facility], agrees with Amoxil and Zpak, he should call the transplant nurse if he feels worse or if he is not improving in 24 hours.  Final Clinical Impressions(s) / ED Diagnoses   Final diagnoses:  Community acquired pneumonia, unspecified laterality    ED Discharge Orders        Ordered    amoxicillin (AMOXIL) 500 MG capsule  3 times daily     10/31/17 0620    azithromycin (ZITHROMAX) 250 MG tablet     10/31/17 8022      Plan discharge  Rolland Porter, MD, Barbette Or, MD 10/31/17 Pecola Lawless    Rolland Porter, MD 10/31/17 [phone]

## 2017-10-31 NOTE — ED Notes (Signed)
Pt becoming impatient and wants to go home.  Advised awaiting lab results at this time.  Provided with drink.

## 2017-10-31 NOTE — ED Triage Notes (Signed)
Pt c/o bilateral ear pain and sore throat x 3 days; pt states he has been running a fever, but unsure of temperature; pt states he has taken tylenol for the fever

## 2017-11-02 LAB — CULTURE, GROUP A STREP (THRC)

## 2017-11-04 DIAGNOSIS — N183 Chronic kidney disease, stage 3 (moderate): Secondary | ICD-10-CM | POA: Diagnosis not present

## 2017-11-04 DIAGNOSIS — T86290 Cardiac allograft vasculopathy: Secondary | ICD-10-CM | POA: Diagnosis not present

## 2017-11-04 DIAGNOSIS — I129 Hypertensive chronic kidney disease with stage 1 through stage 4 chronic kidney disease, or unspecified chronic kidney disease: Secondary | ICD-10-CM | POA: Diagnosis not present

## 2017-11-04 DIAGNOSIS — Z941 Heart transplant status: Secondary | ICD-10-CM | POA: Diagnosis not present

## 2017-11-04 DIAGNOSIS — I25811 Atherosclerosis of native coronary artery of transplanted heart without angina pectoris: Secondary | ICD-10-CM | POA: Diagnosis not present

## 2017-11-09 DIAGNOSIS — E7800 Pure hypercholesterolemia, unspecified: Secondary | ICD-10-CM | POA: Diagnosis not present

## 2017-11-09 DIAGNOSIS — I10 Essential (primary) hypertension: Secondary | ICD-10-CM | POA: Diagnosis not present

## 2017-12-14 DIAGNOSIS — Z941 Heart transplant status: Secondary | ICD-10-CM | POA: Diagnosis not present

## 2017-12-14 DIAGNOSIS — S3992XA Unspecified injury of lower back, initial encounter: Secondary | ICD-10-CM | POA: Diagnosis not present

## 2017-12-14 DIAGNOSIS — Z7982 Long term (current) use of aspirin: Secondary | ICD-10-CM | POA: Diagnosis not present

## 2017-12-14 DIAGNOSIS — Z79899 Other long term (current) drug therapy: Secondary | ICD-10-CM | POA: Diagnosis not present

## 2017-12-14 DIAGNOSIS — M533 Sacrococcygeal disorders, not elsewhere classified: Secondary | ICD-10-CM | POA: Diagnosis not present

## 2017-12-14 DIAGNOSIS — M109 Gout, unspecified: Secondary | ICD-10-CM | POA: Diagnosis not present

## 2017-12-14 DIAGNOSIS — I10 Essential (primary) hypertension: Secondary | ICD-10-CM | POA: Diagnosis not present

## 2017-12-14 DIAGNOSIS — E7800 Pure hypercholesterolemia, unspecified: Secondary | ICD-10-CM | POA: Diagnosis not present

## 2017-12-26 DIAGNOSIS — Z760 Encounter for issue of repeat prescription: Secondary | ICD-10-CM | POA: Diagnosis not present

## 2017-12-29 DIAGNOSIS — Z941 Heart transplant status: Secondary | ICD-10-CM | POA: Diagnosis not present

## 2017-12-29 DIAGNOSIS — H53022 Refractive amblyopia, left eye: Secondary | ICD-10-CM | POA: Diagnosis not present

## 2018-01-10 DIAGNOSIS — A630 Anogenital (venereal) warts: Secondary | ICD-10-CM | POA: Diagnosis not present

## 2018-01-10 DIAGNOSIS — L821 Other seborrheic keratosis: Secondary | ICD-10-CM | POA: Diagnosis not present

## 2018-01-10 DIAGNOSIS — D229 Melanocytic nevi, unspecified: Secondary | ICD-10-CM | POA: Diagnosis not present

## 2018-01-10 DIAGNOSIS — L738 Other specified follicular disorders: Secondary | ICD-10-CM | POA: Diagnosis not present

## 2018-01-23 DIAGNOSIS — Z79899 Other long term (current) drug therapy: Secondary | ICD-10-CM | POA: Diagnosis not present

## 2018-01-23 DIAGNOSIS — N2581 Secondary hyperparathyroidism of renal origin: Secondary | ICD-10-CM | POA: Diagnosis not present

## 2018-01-23 DIAGNOSIS — Z7982 Long term (current) use of aspirin: Secondary | ICD-10-CM | POA: Diagnosis not present

## 2018-01-23 DIAGNOSIS — F17210 Nicotine dependence, cigarettes, uncomplicated: Secondary | ICD-10-CM | POA: Diagnosis not present

## 2018-01-23 DIAGNOSIS — I25811 Atherosclerosis of native coronary artery of transplanted heart without angina pectoris: Secondary | ICD-10-CM | POA: Diagnosis not present

## 2018-01-23 DIAGNOSIS — R809 Proteinuria, unspecified: Secondary | ICD-10-CM | POA: Diagnosis not present

## 2018-01-23 DIAGNOSIS — E559 Vitamin D deficiency, unspecified: Secondary | ICD-10-CM | POA: Diagnosis not present

## 2018-01-23 DIAGNOSIS — D649 Anemia, unspecified: Secondary | ICD-10-CM | POA: Diagnosis not present

## 2018-01-23 DIAGNOSIS — N184 Chronic kidney disease, stage 4 (severe): Secondary | ICD-10-CM | POA: Diagnosis not present

## 2018-01-23 DIAGNOSIS — Z941 Heart transplant status: Secondary | ICD-10-CM | POA: Diagnosis not present

## 2018-01-29 DIAGNOSIS — K219 Gastro-esophageal reflux disease without esophagitis: Secondary | ICD-10-CM | POA: Diagnosis not present

## 2018-01-29 DIAGNOSIS — N492 Inflammatory disorders of scrotum: Secondary | ICD-10-CM | POA: Diagnosis not present

## 2018-01-29 DIAGNOSIS — Z941 Heart transplant status: Secondary | ICD-10-CM | POA: Diagnosis not present

## 2018-01-29 DIAGNOSIS — Z79899 Other long term (current) drug therapy: Secondary | ICD-10-CM | POA: Diagnosis not present

## 2018-01-29 DIAGNOSIS — F17200 Nicotine dependence, unspecified, uncomplicated: Secondary | ICD-10-CM | POA: Diagnosis not present

## 2018-01-29 DIAGNOSIS — Z7982 Long term (current) use of aspirin: Secondary | ICD-10-CM | POA: Diagnosis not present

## 2018-01-29 DIAGNOSIS — I10 Essential (primary) hypertension: Secondary | ICD-10-CM | POA: Diagnosis not present

## 2018-01-29 DIAGNOSIS — M109 Gout, unspecified: Secondary | ICD-10-CM | POA: Diagnosis not present

## 2018-01-29 DIAGNOSIS — E7800 Pure hypercholesterolemia, unspecified: Secondary | ICD-10-CM | POA: Diagnosis not present

## 2018-02-08 DIAGNOSIS — Z941 Heart transplant status: Secondary | ICD-10-CM | POA: Diagnosis not present

## 2018-02-08 DIAGNOSIS — Z79899 Other long term (current) drug therapy: Secondary | ICD-10-CM | POA: Diagnosis not present

## 2018-02-21 DIAGNOSIS — I10 Essential (primary) hypertension: Secondary | ICD-10-CM | POA: Diagnosis not present

## 2018-02-21 DIAGNOSIS — E7800 Pure hypercholesterolemia, unspecified: Secondary | ICD-10-CM | POA: Diagnosis not present

## 2018-02-22 DIAGNOSIS — Z941 Heart transplant status: Secondary | ICD-10-CM | POA: Diagnosis not present

## 2018-02-22 DIAGNOSIS — Z5189 Encounter for other specified aftercare: Secondary | ICD-10-CM | POA: Diagnosis not present

## 2018-03-14 DIAGNOSIS — E7800 Pure hypercholesterolemia, unspecified: Secondary | ICD-10-CM | POA: Diagnosis not present

## 2018-03-14 DIAGNOSIS — I10 Essential (primary) hypertension: Secondary | ICD-10-CM | POA: Diagnosis not present

## 2018-03-27 DIAGNOSIS — G8929 Other chronic pain: Secondary | ICD-10-CM | POA: Diagnosis not present

## 2018-03-27 DIAGNOSIS — R109 Unspecified abdominal pain: Secondary | ICD-10-CM | POA: Diagnosis not present

## 2018-04-10 DIAGNOSIS — E7800 Pure hypercholesterolemia, unspecified: Secondary | ICD-10-CM | POA: Diagnosis not present

## 2018-04-10 DIAGNOSIS — I10 Essential (primary) hypertension: Secondary | ICD-10-CM | POA: Diagnosis not present

## 2018-04-27 DIAGNOSIS — G8929 Other chronic pain: Secondary | ICD-10-CM | POA: Diagnosis not present

## 2018-04-27 DIAGNOSIS — R109 Unspecified abdominal pain: Secondary | ICD-10-CM | POA: Diagnosis not present

## 2018-05-04 DIAGNOSIS — Z23 Encounter for immunization: Secondary | ICD-10-CM | POA: Diagnosis not present

## 2018-05-04 DIAGNOSIS — D508 Other iron deficiency anemias: Secondary | ICD-10-CM | POA: Diagnosis not present

## 2018-05-04 DIAGNOSIS — Z941 Heart transplant status: Secondary | ICD-10-CM | POA: Diagnosis not present

## 2018-05-04 DIAGNOSIS — R1031 Right lower quadrant pain: Secondary | ICD-10-CM | POA: Diagnosis not present

## 2018-05-04 DIAGNOSIS — Z4821 Encounter for aftercare following heart transplant: Secondary | ICD-10-CM | POA: Diagnosis not present

## 2018-05-04 DIAGNOSIS — C218 Malignant neoplasm of overlapping sites of rectum, anus and anal canal: Secondary | ICD-10-CM | POA: Diagnosis not present

## 2018-05-04 DIAGNOSIS — I25811 Atherosclerosis of native coronary artery of transplanted heart without angina pectoris: Secondary | ICD-10-CM | POA: Diagnosis not present

## 2018-05-04 DIAGNOSIS — Z79899 Other long term (current) drug therapy: Secondary | ICD-10-CM | POA: Diagnosis not present

## 2018-05-04 DIAGNOSIS — G8929 Other chronic pain: Secondary | ICD-10-CM | POA: Diagnosis not present

## 2018-05-04 DIAGNOSIS — Z720 Tobacco use: Secondary | ICD-10-CM | POA: Diagnosis not present

## 2018-05-04 DIAGNOSIS — Z8719 Personal history of other diseases of the digestive system: Secondary | ICD-10-CM | POA: Diagnosis not present

## 2018-05-04 DIAGNOSIS — I129 Hypertensive chronic kidney disease with stage 1 through stage 4 chronic kidney disease, or unspecified chronic kidney disease: Secondary | ICD-10-CM | POA: Diagnosis not present

## 2018-05-04 DIAGNOSIS — N184 Chronic kidney disease, stage 4 (severe): Secondary | ICD-10-CM | POA: Diagnosis not present

## 2018-05-04 DIAGNOSIS — A630 Anogenital (venereal) warts: Secondary | ICD-10-CM | POA: Diagnosis not present

## 2018-05-09 DIAGNOSIS — I10 Essential (primary) hypertension: Secondary | ICD-10-CM | POA: Diagnosis not present

## 2018-05-09 DIAGNOSIS — Z08 Encounter for follow-up examination after completed treatment for malignant neoplasm: Secondary | ICD-10-CM | POA: Diagnosis not present

## 2018-05-09 DIAGNOSIS — Z86008 Personal history of in-situ neoplasm of other site: Secondary | ICD-10-CM | POA: Diagnosis not present

## 2018-05-09 DIAGNOSIS — E7800 Pure hypercholesterolemia, unspecified: Secondary | ICD-10-CM | POA: Diagnosis not present

## 2018-06-15 DIAGNOSIS — I351 Nonrheumatic aortic (valve) insufficiency: Secondary | ICD-10-CM | POA: Diagnosis not present

## 2018-06-15 DIAGNOSIS — Z941 Heart transplant status: Secondary | ICD-10-CM | POA: Diagnosis not present

## 2018-06-20 DIAGNOSIS — E7800 Pure hypercholesterolemia, unspecified: Secondary | ICD-10-CM | POA: Diagnosis not present

## 2018-06-20 DIAGNOSIS — Z7189 Other specified counseling: Secondary | ICD-10-CM | POA: Diagnosis not present

## 2018-06-20 DIAGNOSIS — Z299 Encounter for prophylactic measures, unspecified: Secondary | ICD-10-CM | POA: Diagnosis not present

## 2018-06-20 DIAGNOSIS — Z1339 Encounter for screening examination for other mental health and behavioral disorders: Secondary | ICD-10-CM | POA: Diagnosis not present

## 2018-06-20 DIAGNOSIS — Z1331 Encounter for screening for depression: Secondary | ICD-10-CM | POA: Diagnosis not present

## 2018-06-20 DIAGNOSIS — Z79899 Other long term (current) drug therapy: Secondary | ICD-10-CM | POA: Diagnosis not present

## 2018-06-20 DIAGNOSIS — Z0000 Encounter for general adult medical examination without abnormal findings: Secondary | ICD-10-CM | POA: Diagnosis not present

## 2018-06-20 DIAGNOSIS — F17210 Nicotine dependence, cigarettes, uncomplicated: Secondary | ICD-10-CM | POA: Diagnosis not present

## 2018-06-20 DIAGNOSIS — R5383 Other fatigue: Secondary | ICD-10-CM | POA: Diagnosis not present

## 2018-06-20 DIAGNOSIS — Z681 Body mass index (BMI) 19 or less, adult: Secondary | ICD-10-CM | POA: Diagnosis not present

## 2018-06-20 DIAGNOSIS — I10 Essential (primary) hypertension: Secondary | ICD-10-CM | POA: Diagnosis not present

## 2018-06-23 DIAGNOSIS — I10 Essential (primary) hypertension: Secondary | ICD-10-CM | POA: Diagnosis not present

## 2018-06-23 DIAGNOSIS — E7800 Pure hypercholesterolemia, unspecified: Secondary | ICD-10-CM | POA: Diagnosis not present

## 2018-06-30 DIAGNOSIS — J438 Other emphysema: Secondary | ICD-10-CM | POA: Diagnosis not present

## 2018-06-30 DIAGNOSIS — Z941 Heart transplant status: Secondary | ICD-10-CM | POA: Diagnosis not present

## 2018-06-30 DIAGNOSIS — F17200 Nicotine dependence, unspecified, uncomplicated: Secondary | ICD-10-CM | POA: Diagnosis not present

## 2018-06-30 DIAGNOSIS — R109 Unspecified abdominal pain: Secondary | ICD-10-CM | POA: Diagnosis not present

## 2018-06-30 DIAGNOSIS — I351 Nonrheumatic aortic (valve) insufficiency: Secondary | ICD-10-CM | POA: Diagnosis not present

## 2018-07-20 DIAGNOSIS — I351 Nonrheumatic aortic (valve) insufficiency: Secondary | ICD-10-CM | POA: Diagnosis not present

## 2018-07-20 DIAGNOSIS — F17210 Nicotine dependence, cigarettes, uncomplicated: Secondary | ICD-10-CM | POA: Diagnosis not present

## 2018-07-20 DIAGNOSIS — Z941 Heart transplant status: Secondary | ICD-10-CM | POA: Diagnosis not present

## 2018-07-25 DIAGNOSIS — Z941 Heart transplant status: Secondary | ICD-10-CM | POA: Diagnosis not present

## 2018-07-28 DIAGNOSIS — E7800 Pure hypercholesterolemia, unspecified: Secondary | ICD-10-CM | POA: Diagnosis not present

## 2018-07-28 DIAGNOSIS — I10 Essential (primary) hypertension: Secondary | ICD-10-CM | POA: Diagnosis not present

## 2018-07-31 DIAGNOSIS — Z941 Heart transplant status: Secondary | ICD-10-CM | POA: Diagnosis not present

## 2018-07-31 DIAGNOSIS — Z01810 Encounter for preprocedural cardiovascular examination: Secondary | ICD-10-CM | POA: Diagnosis not present

## 2018-07-31 DIAGNOSIS — I351 Nonrheumatic aortic (valve) insufficiency: Secondary | ICD-10-CM | POA: Diagnosis not present

## 2018-07-31 DIAGNOSIS — I2510 Atherosclerotic heart disease of native coronary artery without angina pectoris: Secondary | ICD-10-CM | POA: Diagnosis not present

## 2018-07-31 DIAGNOSIS — I4510 Unspecified right bundle-branch block: Secondary | ICD-10-CM | POA: Diagnosis not present

## 2018-07-31 DIAGNOSIS — I429 Cardiomyopathy, unspecified: Secondary | ICD-10-CM | POA: Diagnosis not present

## 2018-08-01 DIAGNOSIS — R9431 Abnormal electrocardiogram [ECG] [EKG]: Secondary | ICD-10-CM | POA: Diagnosis not present

## 2018-08-01 DIAGNOSIS — I4510 Unspecified right bundle-branch block: Secondary | ICD-10-CM | POA: Diagnosis not present

## 2018-08-02 DIAGNOSIS — Z941 Heart transplant status: Secondary | ICD-10-CM | POA: Diagnosis not present

## 2018-08-09 DIAGNOSIS — I441 Atrioventricular block, second degree: Secondary | ICD-10-CM | POA: Diagnosis not present

## 2018-08-09 DIAGNOSIS — D508 Other iron deficiency anemias: Secondary | ICD-10-CM | POA: Diagnosis not present

## 2018-08-09 DIAGNOSIS — D899 Disorder involving the immune mechanism, unspecified: Secondary | ICD-10-CM | POA: Diagnosis not present

## 2018-08-09 DIAGNOSIS — J90 Pleural effusion, not elsewhere classified: Secondary | ICD-10-CM | POA: Diagnosis not present

## 2018-08-09 DIAGNOSIS — F17200 Nicotine dependence, unspecified, uncomplicated: Secondary | ICD-10-CM | POA: Diagnosis not present

## 2018-08-09 DIAGNOSIS — I440 Atrioventricular block, first degree: Secondary | ICD-10-CM | POA: Diagnosis not present

## 2018-08-09 DIAGNOSIS — F17210 Nicotine dependence, cigarettes, uncomplicated: Secondary | ICD-10-CM | POA: Diagnosis present

## 2018-08-09 DIAGNOSIS — I9581 Postprocedural hypotension: Secondary | ICD-10-CM | POA: Diagnosis not present

## 2018-08-09 DIAGNOSIS — N139 Obstructive and reflux uropathy, unspecified: Secondary | ICD-10-CM | POA: Diagnosis not present

## 2018-08-09 DIAGNOSIS — I132 Hypertensive heart and chronic kidney disease with heart failure and with stage 5 chronic kidney disease, or end stage renal disease: Secondary | ICD-10-CM | POA: Diagnosis present

## 2018-08-09 DIAGNOSIS — Z952 Presence of prosthetic heart valve: Secondary | ICD-10-CM | POA: Diagnosis not present

## 2018-08-09 DIAGNOSIS — E872 Acidosis: Secondary | ICD-10-CM | POA: Diagnosis not present

## 2018-08-09 DIAGNOSIS — I2510 Atherosclerotic heart disease of native coronary artery without angina pectoris: Secondary | ICD-10-CM | POA: Diagnosis not present

## 2018-08-09 DIAGNOSIS — E875 Hyperkalemia: Secondary | ICD-10-CM | POA: Diagnosis not present

## 2018-08-09 DIAGNOSIS — N179 Acute kidney failure, unspecified: Secondary | ICD-10-CM | POA: Diagnosis not present

## 2018-08-09 DIAGNOSIS — N141 Nephropathy induced by other drugs, medicaments and biological substances: Secondary | ICD-10-CM | POA: Diagnosis not present

## 2018-08-09 DIAGNOSIS — Z5181 Encounter for therapeutic drug level monitoring: Secondary | ICD-10-CM | POA: Diagnosis not present

## 2018-08-09 DIAGNOSIS — T451X5S Adverse effect of antineoplastic and immunosuppressive drugs, sequela: Secondary | ICD-10-CM | POA: Diagnosis not present

## 2018-08-09 DIAGNOSIS — E869 Volume depletion, unspecified: Secondary | ICD-10-CM | POA: Diagnosis not present

## 2018-08-09 DIAGNOSIS — G8918 Other acute postprocedural pain: Secondary | ICD-10-CM | POA: Diagnosis not present

## 2018-08-09 DIAGNOSIS — Z79899 Other long term (current) drug therapy: Secondary | ICD-10-CM | POA: Diagnosis not present

## 2018-08-09 DIAGNOSIS — E861 Hypovolemia: Secondary | ICD-10-CM | POA: Diagnosis not present

## 2018-08-09 DIAGNOSIS — Z950 Presence of cardiac pacemaker: Secondary | ICD-10-CM | POA: Diagnosis not present

## 2018-08-09 DIAGNOSIS — J9601 Acute respiratory failure with hypoxia: Secondary | ICD-10-CM | POA: Diagnosis not present

## 2018-08-09 DIAGNOSIS — I351 Nonrheumatic aortic (valve) insufficiency: Secondary | ICD-10-CM | POA: Diagnosis not present

## 2018-08-09 DIAGNOSIS — Z9981 Dependence on supplemental oxygen: Secondary | ICD-10-CM | POA: Diagnosis not present

## 2018-08-09 DIAGNOSIS — R600 Localized edema: Secondary | ICD-10-CM | POA: Diagnosis not present

## 2018-08-09 DIAGNOSIS — N184 Chronic kidney disease, stage 4 (severe): Secondary | ICD-10-CM | POA: Diagnosis not present

## 2018-08-09 DIAGNOSIS — E871 Hypo-osmolality and hyponatremia: Secondary | ICD-10-CM | POA: Diagnosis not present

## 2018-08-09 DIAGNOSIS — Z7982 Long term (current) use of aspirin: Secondary | ICD-10-CM | POA: Diagnosis not present

## 2018-08-09 DIAGNOSIS — I442 Atrioventricular block, complete: Secondary | ICD-10-CM | POA: Diagnosis not present

## 2018-08-09 DIAGNOSIS — E8809 Other disorders of plasma-protein metabolism, not elsewhere classified: Secondary | ICD-10-CM | POA: Diagnosis not present

## 2018-08-09 DIAGNOSIS — E46 Unspecified protein-calorie malnutrition: Secondary | ICD-10-CM | POA: Diagnosis not present

## 2018-08-09 DIAGNOSIS — I959 Hypotension, unspecified: Secondary | ICD-10-CM | POA: Diagnosis not present

## 2018-08-09 DIAGNOSIS — J942 Hemothorax: Secondary | ICD-10-CM | POA: Diagnosis not present

## 2018-08-09 DIAGNOSIS — Z954 Presence of other heart-valve replacement: Secondary | ICD-10-CM | POA: Diagnosis not present

## 2018-08-09 DIAGNOSIS — D696 Thrombocytopenia, unspecified: Secondary | ICD-10-CM | POA: Diagnosis not present

## 2018-08-09 DIAGNOSIS — Z940 Kidney transplant status: Secondary | ICD-10-CM | POA: Diagnosis not present

## 2018-08-09 DIAGNOSIS — I129 Hypertensive chronic kidney disease with stage 1 through stage 4 chronic kidney disease, or unspecified chronic kidney disease: Secondary | ICD-10-CM | POA: Diagnosis not present

## 2018-08-09 DIAGNOSIS — R55 Syncope and collapse: Secondary | ICD-10-CM | POA: Diagnosis not present

## 2018-08-09 DIAGNOSIS — Z720 Tobacco use: Secondary | ICD-10-CM | POA: Diagnosis not present

## 2018-08-09 DIAGNOSIS — N189 Chronic kidney disease, unspecified: Secondary | ICD-10-CM | POA: Diagnosis not present

## 2018-08-09 DIAGNOSIS — D62 Acute posthemorrhagic anemia: Secondary | ICD-10-CM | POA: Diagnosis not present

## 2018-08-09 DIAGNOSIS — M25512 Pain in left shoulder: Secondary | ICD-10-CM | POA: Diagnosis not present

## 2018-08-09 DIAGNOSIS — N185 Chronic kidney disease, stage 5: Secondary | ICD-10-CM | POA: Diagnosis present

## 2018-08-09 DIAGNOSIS — Z941 Heart transplant status: Secondary | ICD-10-CM | POA: Diagnosis not present

## 2018-08-09 DIAGNOSIS — J9691 Respiratory failure, unspecified with hypoxia: Secondary | ICD-10-CM | POA: Diagnosis not present

## 2018-08-09 DIAGNOSIS — Z4821 Encounter for aftercare following heart transplant: Secondary | ICD-10-CM | POA: Diagnosis not present

## 2018-08-09 DIAGNOSIS — E8770 Fluid overload, unspecified: Secondary | ICD-10-CM | POA: Diagnosis not present

## 2018-08-09 DIAGNOSIS — M109 Gout, unspecified: Secondary | ICD-10-CM | POA: Diagnosis present

## 2018-08-09 DIAGNOSIS — J810 Acute pulmonary edema: Secondary | ICD-10-CM | POA: Diagnosis not present

## 2018-08-09 DIAGNOSIS — I340 Nonrheumatic mitral (valve) insufficiency: Secondary | ICD-10-CM | POA: Diagnosis not present

## 2018-08-09 DIAGNOSIS — J9811 Atelectasis: Secondary | ICD-10-CM | POA: Diagnosis not present

## 2018-08-09 DIAGNOSIS — Z9911 Dependence on respirator [ventilator] status: Secondary | ICD-10-CM | POA: Diagnosis not present

## 2018-08-09 DIAGNOSIS — R918 Other nonspecific abnormal finding of lung field: Secondary | ICD-10-CM | POA: Diagnosis not present

## 2018-08-09 DIAGNOSIS — Z01818 Encounter for other preprocedural examination: Secondary | ICD-10-CM | POA: Diagnosis not present

## 2018-08-09 DIAGNOSIS — K219 Gastro-esophageal reflux disease without esophagitis: Secondary | ICD-10-CM | POA: Diagnosis not present

## 2018-08-09 DIAGNOSIS — I25811 Atherosclerosis of native coronary artery of transplanted heart without angina pectoris: Secondary | ICD-10-CM | POA: Diagnosis present

## 2018-08-09 DIAGNOSIS — I319 Disease of pericardium, unspecified: Secondary | ICD-10-CM | POA: Diagnosis not present

## 2018-08-09 DIAGNOSIS — I420 Dilated cardiomyopathy: Secondary | ICD-10-CM | POA: Diagnosis present

## 2018-08-09 DIAGNOSIS — I10 Essential (primary) hypertension: Secondary | ICD-10-CM | POA: Diagnosis not present

## 2018-08-09 DIAGNOSIS — J95821 Acute postprocedural respiratory failure: Secondary | ICD-10-CM | POA: Diagnosis not present

## 2018-08-09 DIAGNOSIS — I97190 Other postprocedural cardiac functional disturbances following cardiac surgery: Secondary | ICD-10-CM | POA: Diagnosis not present

## 2018-08-09 DIAGNOSIS — R079 Chest pain, unspecified: Secondary | ICD-10-CM | POA: Diagnosis not present

## 2018-08-09 DIAGNOSIS — R609 Edema, unspecified: Secondary | ICD-10-CM | POA: Diagnosis not present

## 2018-08-09 DIAGNOSIS — D72829 Elevated white blood cell count, unspecified: Secondary | ICD-10-CM | POA: Diagnosis not present

## 2018-08-09 DIAGNOSIS — E162 Hypoglycemia, unspecified: Secondary | ICD-10-CM | POA: Diagnosis present

## 2018-08-09 DIAGNOSIS — Z9689 Presence of other specified functional implants: Secondary | ICD-10-CM | POA: Diagnosis not present

## 2018-08-09 DIAGNOSIS — J939 Pneumothorax, unspecified: Secondary | ICD-10-CM | POA: Diagnosis not present

## 2018-08-09 DIAGNOSIS — I428 Other cardiomyopathies: Secondary | ICD-10-CM | POA: Diagnosis not present

## 2018-08-25 ENCOUNTER — Emergency Department (HOSPITAL_COMMUNITY): Payer: Medicare Other

## 2018-08-25 ENCOUNTER — Emergency Department (HOSPITAL_COMMUNITY)
Admission: EM | Admit: 2018-08-25 | Discharge: 2018-08-25 | Disposition: A | Discharge status: Other Acute Inpatient Hospital | Payer: Medicare Other | Attending: Emergency Medicine | Admitting: Emergency Medicine

## 2018-08-25 ENCOUNTER — Encounter (HOSPITAL_COMMUNITY): Payer: Self-pay

## 2018-08-25 DIAGNOSIS — D582 Other hemoglobinopathies: Secondary | ICD-10-CM | POA: Diagnosis not present

## 2018-08-25 DIAGNOSIS — N170 Acute kidney failure with tubular necrosis: Secondary | ICD-10-CM | POA: Diagnosis not present

## 2018-08-25 DIAGNOSIS — R0902 Hypoxemia: Secondary | ICD-10-CM | POA: Diagnosis not present

## 2018-08-25 DIAGNOSIS — I4891 Unspecified atrial fibrillation: Secondary | ICD-10-CM | POA: Diagnosis not present

## 2018-08-25 DIAGNOSIS — R918 Other nonspecific abnormal finding of lung field: Secondary | ICD-10-CM | POA: Diagnosis not present

## 2018-08-25 DIAGNOSIS — I10 Essential (primary) hypertension: Secondary | ICD-10-CM | POA: Diagnosis not present

## 2018-08-25 DIAGNOSIS — F17210 Nicotine dependence, cigarettes, uncomplicated: Secondary | ICD-10-CM | POA: Diagnosis not present

## 2018-08-25 DIAGNOSIS — Z7982 Long term (current) use of aspirin: Secondary | ICD-10-CM | POA: Insufficient documentation

## 2018-08-25 DIAGNOSIS — E871 Hypo-osmolality and hyponatremia: Secondary | ICD-10-CM | POA: Diagnosis not present

## 2018-08-25 DIAGNOSIS — R0602 Shortness of breath: Secondary | ICD-10-CM

## 2018-08-25 DIAGNOSIS — I4510 Unspecified right bundle-branch block: Secondary | ICD-10-CM | POA: Diagnosis not present

## 2018-08-25 DIAGNOSIS — Z79899 Other long term (current) drug therapy: Secondary | ICD-10-CM | POA: Diagnosis not present

## 2018-08-25 DIAGNOSIS — R778 Other specified abnormalities of plasma proteins: Secondary | ICD-10-CM

## 2018-08-25 DIAGNOSIS — N184 Chronic kidney disease, stage 4 (severe): Secondary | ICD-10-CM | POA: Diagnosis not present

## 2018-08-25 DIAGNOSIS — R079 Chest pain, unspecified: Secondary | ICD-10-CM | POA: Diagnosis not present

## 2018-08-25 DIAGNOSIS — R7989 Other specified abnormal findings of blood chemistry: Secondary | ICD-10-CM | POA: Diagnosis not present

## 2018-08-25 DIAGNOSIS — I25811 Atherosclerosis of native coronary artery of transplanted heart without angina pectoris: Secondary | ICD-10-CM | POA: Diagnosis not present

## 2018-08-25 DIAGNOSIS — J918 Pleural effusion in other conditions classified elsewhere: Secondary | ICD-10-CM | POA: Diagnosis not present

## 2018-08-25 DIAGNOSIS — R52 Pain, unspecified: Secondary | ICD-10-CM | POA: Diagnosis not present

## 2018-08-25 DIAGNOSIS — N186 End stage renal disease: Secondary | ICD-10-CM | POA: Diagnosis not present

## 2018-08-25 DIAGNOSIS — C218 Malignant neoplasm of overlapping sites of rectum, anus and anal canal: Secondary | ICD-10-CM | POA: Diagnosis not present

## 2018-08-25 DIAGNOSIS — I428 Other cardiomyopathies: Secondary | ICD-10-CM | POA: Diagnosis not present

## 2018-08-25 DIAGNOSIS — D72829 Elevated white blood cell count, unspecified: Secondary | ICD-10-CM | POA: Diagnosis not present

## 2018-08-25 DIAGNOSIS — J90 Pleural effusion, not elsewhere classified: Secondary | ICD-10-CM | POA: Diagnosis not present

## 2018-08-25 DIAGNOSIS — R072 Precordial pain: Secondary | ICD-10-CM | POA: Diagnosis not present

## 2018-08-25 DIAGNOSIS — A630 Anogenital (venereal) warts: Secondary | ICD-10-CM | POA: Diagnosis not present

## 2018-08-25 DIAGNOSIS — E790 Hyperuricemia without signs of inflammatory arthritis and tophaceous disease: Secondary | ICD-10-CM | POA: Diagnosis not present

## 2018-08-25 LAB — I-STAT TROPONIN, ED: TROPONIN I, POC: 0.23 ng/mL — AB (ref 0.00–0.08)

## 2018-08-25 LAB — CBC WITH DIFFERENTIAL/PLATELET
ABS IMMATURE GRANULOCYTES: 0.17 10*3/uL — AB (ref 0.00–0.07)
BASOS ABS: 0.1 10*3/uL (ref 0.0–0.1)
Basophils Relative: 0 %
Eosinophils Absolute: 0 10*3/uL (ref 0.0–0.5)
Eosinophils Relative: 0 %
HCT: 29.4 % — ABNORMAL LOW (ref 39.0–52.0)
HEMOGLOBIN: 9.9 g/dL — AB (ref 13.0–17.0)
Immature Granulocytes: 1 %
Lymphocytes Relative: 6 %
Lymphs Abs: 1.1 10*3/uL (ref 0.7–4.0)
MCH: 27 pg (ref 26.0–34.0)
MCHC: 33.7 g/dL (ref 30.0–36.0)
MCV: 80.1 fL (ref 80.0–100.0)
MONO ABS: 1.8 10*3/uL — AB (ref 0.1–1.0)
Monocytes Relative: 10 %
NRBC: 0 % (ref 0.0–0.2)
Neutro Abs: 14.5 10*3/uL — ABNORMAL HIGH (ref 1.7–7.7)
Neutrophils Relative %: 83 %
Platelets: 250 10*3/uL (ref 150–400)
RBC: 3.67 MIL/uL — ABNORMAL LOW (ref 4.22–5.81)
RDW: 19.4 % — ABNORMAL HIGH (ref 11.5–15.5)
WBC: 17.6 10*3/uL — ABNORMAL HIGH (ref 4.0–10.5)

## 2018-08-25 LAB — COMPREHENSIVE METABOLIC PANEL
ALK PHOS: 447 U/L — AB (ref 38–126)
ALT: 66 U/L — AB (ref 0–44)
ANION GAP: 16 — AB (ref 5–15)
AST: 107 U/L — ABNORMAL HIGH (ref 15–41)
Albumin: 2.8 g/dL — ABNORMAL LOW (ref 3.5–5.0)
BUN: 98 mg/dL — ABNORMAL HIGH (ref 6–20)
CALCIUM: 9.3 mg/dL (ref 8.9–10.3)
CHLORIDE: 98 mmol/L (ref 98–111)
CO2: 23 mmol/L (ref 22–32)
CREATININE: 5.88 mg/dL — AB (ref 0.61–1.24)
GFR calc non Af Amer: 11 mL/min — ABNORMAL LOW (ref >60)
GFR, EST AFRICAN AMERICAN: 13 mL/min — AB (ref >60)
Glucose, Bld: 133 mg/dL — ABNORMAL HIGH (ref 70–99)
Potassium: 4.9 mmol/L (ref 3.5–5.1)
Sodium: 137 mmol/L (ref 135–145)
Total Bilirubin: 1.3 mg/dL — ABNORMAL HIGH (ref 0.3–1.2)
Total Protein: 8.1 g/dL (ref 6.5–8.1)

## 2018-08-25 LAB — BRAIN NATRIURETIC PEPTIDE: B NATRIURETIC PEPTIDE 5: 1125.4 pg/mL — AB (ref 0.0–100.0)

## 2018-08-25 MED ORDER — FENTANYL CITRATE (PF) 100 MCG/2ML IJ SOLN
INTRAMUSCULAR | Status: AC
  Filled 2018-08-25: qty 2

## 2018-08-25 MED ORDER — FENTANYL CITRATE (PF) 100 MCG/2ML IJ SOLN
50.0000 ug | Freq: Once | INTRAMUSCULAR | Status: AC
  Administered 2018-08-25: 50 ug via INTRAVENOUS
  Filled 2018-08-25: qty 2

## 2018-08-25 MED ORDER — FENTANYL CITRATE (PF) 100 MCG/2ML IJ SOLN
50.0000 ug | Freq: Once | INTRAMUSCULAR | Status: AC
  Administered 2018-08-25: 50 ug via INTRAVENOUS

## 2018-08-25 NOTE — ED Notes (Signed)
[facility] transport to check on the status of transport, called [facility] to verify wait for transport.

## 2018-08-25 NOTE — ED Notes (Signed)
Called [facility] line for consult with CT surgery Per Dr. Ronnald Nian

## 2018-08-25 NOTE — ED Notes (Signed)
Troponin results given to Dr. Ronnald Nian.

## 2018-08-25 NOTE — ED Provider Notes (Addendum)
[facility] Provider Note   CSN: [id number] Arrival date & time: 08/25/18  0706     History   Chief Complaint Chief Complaint  Patient presents with  . Shortness of Breath    HPI Antonio Powell is a 42 y.o. male.  The history is provided by the patient.  Shortness of Breath  This is a new problem. The problem occurs continuously.The current episode started more than 2 days ago. The problem has been gradually worsening. Associated symptoms include chest pain. Pertinent negatives include no fever, no headaches, no coryza, no rhinorrhea, no sore throat, no ear pain, no neck pain, no cough, no hemoptysis, no PND, no orthopnea, no syncope, no vomiting, no abdominal pain, no rash, no leg pain and no leg swelling. It is unknown what precipitated the problem. He has tried nothing for the symptoms. The treatment provided no relief. He has had prior hospitalizations. He has had prior ED visits. Associated medical issues comments: Heart transplant in 2001 and recent heart valve replacement at [facility] two weeks ago. Discharged two days ago. .    Past Medical History:  Diagnosis Date  . Heart transplant recipient [facility])   . Hypertension     There are no active problems to display for this patient.   Past Surgical History:  Procedure Laterality Date  . APPENDECTOMY    . CARDIAC SURGERY    . CHOLECYSTECTOMY    . HERNIA REPAIR    . left ear          Home Medications    Prior to Admission medications   Medication Sig Start Date End Date Taking? Authorizing Provider  allopurinol (ZYLOPRIM) 100 MG tablet Take 100 mg by mouth daily.    [provider]  amoxicillin (AMOXIL) 500 MG capsule Take 1 capsule (500 mg total) by mouth 3 (three) times daily. 10/31/17   Rolland Porter, MD  aspirin 81 MG chewable tablet Chew 81 mg by mouth daily.    [provider]  azithromycin (ZITHROMAX) 250 MG tablet Take 1 po QD starting on Dec 25  10/31/17   Rolland Porter, MD  CARVEDILOL PO Take by mouth 3 (three) times daily.    [provider]  diltiazem (TIAZAC) 360 MG 24 hr capsule Take 360 mg by mouth daily.    [provider]  folic acid (FOLVITE) 1 MG tablet Take 1 mg by mouth daily.    [provider]  furosemide (LASIX) 20 MG tablet Take 20 mg by mouth daily.    [provider]  hydrALAZINE (APRESOLINE) 50 MG tablet Take 50 mg by mouth 3 (three) times daily.    [provider]  iron polysaccharides (NIFEREX) 150 MG capsule Take 150 mg by mouth daily.    [provider]  linaclotide (LINZESS) 145 MCG CAPS capsule Take 145 mcg by mouth daily before breakfast.    [provider]  magnesium oxide (MAG-OX) 400 MG tablet Take 400 mg by mouth daily.    [provider]  metroNIDAZOLE (FLAGYL) 500 MG tablet Take 1 tablet (500 mg total) by mouth 2 (two) times daily. 07/05/16   Jonnie Kind, MD  Multiple Vitamins-Minerals (MULTIVITAMIN ADULT) TABS Take 1 tablet by mouth daily.    [provider]  pravastatin (PRAVACHOL) 40 MG tablet Take 40 mg by mouth daily.    [provider]  ranitidine (ZANTAC) 150 MG tablet Take 150 mg by mouth 2 (two) times daily.  [provider]  sirolimus (RAPAMUNE) 1 MG tablet Take 1 mg by mouth daily.    [provider]  tacrolimus (PROGRAF) 1 MG capsule Take 1 mg by mouth 2 (two) times daily.    [provider]  traMADol (ULTRAM) 50 MG tablet Take 1 tablet (50 mg total) by mouth every 6 (six) hours as needed. 09/04/17   Kem Parkinson, PA-C    Family History No family history on file.  Social History Social History   Tobacco Use  . Smoking status: Current Every Day Smoker    Packs/day: 0.50    Types: Cigarettes  . Smokeless tobacco: Never Used  Substance Use Topics  . Alcohol use: Yes    Comment: occ  . Drug use: Yes    Types: Marijuana     Allergies   Nsaids and Phenergan  [promethazine hcl]   Review of Systems Review of Systems  Constitutional: Negative for chills and fever.  HENT: Negative for ear pain, rhinorrhea and sore throat.   Eyes: Negative for pain and visual disturbance.  Respiratory: Positive for shortness of breath. Negative for cough and hemoptysis.   Cardiovascular: Positive for chest pain. Negative for palpitations, orthopnea, leg swelling, syncope and PND.  Gastrointestinal: Negative for abdominal pain and vomiting.  Genitourinary: Negative for dysuria and hematuria.  Musculoskeletal: Negative for arthralgias, back pain and neck pain.  Skin: Negative for color change and rash.  Neurological: Negative for seizures, syncope and headaches.  All other systems reviewed and are negative.    Physical Exam Updated Vital Signs  ED Triage Vitals  Enc Vitals Group     BP 08/25/18 0715 (!) 169/115     Pulse Rate 08/25/18 0715 (!) 111     Resp 08/25/18 0715 (!) 24     Temp 08/25/18 0710 97.9 F (36.6 C)     Temp Source 08/25/18 0710 Oral     SpO2 08/25/18 0715 96 %     Weight --      Height --      Head Circumference --      Peak Flow --      Pain Score 08/25/18 0730 7     Pain Loc --      Pain Edu? --      Excl. in [place]? --     Physical Exam  Constitutional: He is oriented to person, place, and time. He appears well-developed and well-nourished. No distress.  HENT:  Head: Normocephalic and atraumatic.  Eyes: Pupils are equal, round, and reactive to light. Conjunctivae and EOM are normal.  Neck: Normal range of motion. Neck supple.  Cardiovascular: Normal rate and regular rhythm.  No murmur heard. Pulmonary/Chest: Effort normal. No respiratory distress. He has decreased breath sounds. He has no wheezes. He has rales.  Abdominal: Soft. There is no tenderness.  Musculoskeletal: He exhibits no edema.       Right lower leg: He exhibits no edema.       Left lower leg: He exhibits no edema.  Neurological: He is alert and oriented to  person, place, and time.  Skin: Skin is warm and dry. Capillary refill takes less than 2 seconds.  Psychiatric: He has a normal mood and affect.  Nursing note and vitals reviewed.    ED Treatments / Results  Labs (all labs ordered are listed, but only abnormal results are displayed) Labs Reviewed  COMPREHENSIVE METABOLIC PANEL - Abnormal; Notable for the following components:      Result Value  Glucose, Bld 133 (*)    BUN 98 (*)    Creatinine, Ser 5.88 (*)    Albumin 2.8 (*)    AST 107 (*)    ALT 66 (*)    Alkaline Phosphatase 447 (*)    Total Bilirubin 1.3 (*)    GFR calc non Af Amer 11 (*)    GFR calc Af Amer 13 (*)    Anion gap 16 (*)    All other components within normal limits  CBC WITH DIFFERENTIAL/PLATELET - Abnormal; Notable for the following components:   WBC 17.6 (*)    RBC 3.67 (*)    Hemoglobin 9.9 (*)    HCT 29.4 (*)    RDW 19.4 (*)    Neutro Abs 14.5 (*)    Monocytes Absolute 1.8 (*)    Abs Immature Granulocytes 0.17 (*)    All other components within normal limits  I-STAT TROPONIN, ED - Abnormal; Notable for the following components:   Troponin i, poc 0.23 (*)    All other components within normal limits  BRAIN NATRIURETIC PEPTIDE    EKG EKG Interpretation  Date/Time:  Friday August 25 2018 07:10:22 EDT Ventricular Rate:  114 PR Interval:    QRS Duration: 133 QT Interval:  413 QTC Calculation: 569 R Axis:   67 Text Interpretation:  Sinus tachycardia Right bundle branch block Probable posterior infarct, acute Lateral leads are also involved Confirmed by Lennice Sites [phone]) on 08/25/2018 7:20:29 AM   Radiology Dg Chest Port 1 View  Result Date: 08/25/2018 CLINICAL DATA:  42 year old male with a history of sharp left-sided chest pain with shortness of breath. EXAM: PORTABLE CHEST 1 VIEW COMPARISON:  10/31/2017, 06/02/2016 FINDINGS: Cardiomediastinal silhouette is enlarged compared to the prior plain film of 10/31/2017. Since the prior plain  film there has been redo sternotomy with intact sternal wires. Surgical changes of aortic valve repair. Interval placement of cardiac pacing device with 2 leads in place. Opacity at the lateral aspect of the right chest continuous with the pleural surface with a lentiform configuration blunting the right costophrenic angle. Evidence of right-sided pleural fluid. Blunting at the left costophrenic angle. No displaced fracture. IMPRESSION: New opacity at the right lateral chest continuous with the pleural surface, potentially representing loculated pleural fluid, empyema, or other mass. Given the patient's history and possibility of immune compromise, infection is highest on the differential diagnosis, and further evaluation with contrast-enhanced chest CT is recommended. Since the prior plain film of December 2018, there has been enlargement of the cardiomediastinal silhouette and changes of redo sternotomy, cardiac pacer placement, and aortic valve repair. The enlarged cardiac diameter may reflect baseline anatomy with a history of a heart transplant, however, a new pericardial effusion cannot be excluded, as there is no new baseline imaging for comparison. Correlation with either cardiac echo or contrast-enhanced CT recommended. Electronically Signed   By: Corrie Mckusick D.O.   On: 08/25/2018 08:22    Procedures Procedures (including critical care time)  Medications Ordered in ED Medications  fentaNYL (SUBLIMAZE) injection 50 mcg (50 mcg Intravenous Given 08/25/18 0733)      EMERGENCY DEPARTMENT [place] CARDIAC EXAM "Study: Limited Ultrasound of the Heart and Pericardium"  INDICATIONS:Dyspnea Multiple views of the heart and pericardium were obtained in real-time with a multi-frequency probe.  PERFORMED [place]:OVZCHY IMAGES ARCHIVED?: Yes LIMITATIONS:  Body habitus VIEWS USED: Parasternal long axis and Parasternal short axis INTERPRETATION: Cardiac activity present, Pericardial effusioin absent, Cardiac  tamponade absent and Decreased contractility   Initial  Impression / Assessment and Plan / ED Course  I have reviewed the triage vital signs and the nursing notes.  Pertinent labs & imaging results that were available during my care of the patient were reviewed by me and considered in my medical decision making (see chart for details).     Antonio Powell is a 42 year old male with history of heart transplant on immunosuppressants, recent aortic valve replacement last week with cardiothoracic surgery at [facility] who presents to the ED with shortness of breath, left-sided chest pain.  Patient with tachycardia but otherwise unremarkable vitals.  Denies any cough, sputum production.  Patient had aortic valve replacement last week.  Patient had sternotomy, right-sided chest tube.  Surgical site overall well-appearing.  He has a hematoma at his sternotomy site and chest tube site that he states has been there since his surgery.  Patient states that pain is worse with deep inspiration.  Has been compliant with his medications.  Home narcotics have not helped his pain.  EKG upon arrival showed sinus tachycardia with right bundle branch block.  T wave inversions anteriorly.  No prior EKG to compare to.  Patient with diminished breath sounds on exam.  No signs of peripheral edema.  Possibly mild rales.  Patient with bedside echocardiogram that was performed that showed no major pericardial effusion, no obvious signs of tamponade, decreased contractility.  Concern for pericarditis versus infectious process, possible volume overload.  Troponin elevated to 0.23, could be volume overload or due to recent cardiac surgery. Will need to be trended. Low concern for ACS. White count is 17.6 and upon chart review seems to be increased from discharge white count of 11.  Chest x-ray performed shows possible loculated pleural fluid in the right lower chest versus possible empyema versus possible mass and recommending a  CT scan.  Cardiac silhouette is enlarged from prior chest x-ray from years ago.  [place] at baseline.  Electrolytes overall unremarkable.  Contacted Dr. Glo Herring at [facility] with cardiothoracic surgery about concern for pericarditis versus infectious process.  At this point Dr. Glo Herring recommends holding off any antibiotics, medications until patient can be evaluated at the [facility] emergency department.  Will likely need CT scan of his chest and cardiothoracic surgery consultation and admission.  Hemodynamically stable throughout my care.  Patient with normal blood pressure, mild tachycardia that did improve with IV fentanyl.  Dr. Glo Herring recommends holding off on all morning medications including antiarrhythmics.  Transferred from the ED in stable condition.  This chart was dictated using voice recognition software.  Despite best efforts to proofread,  errors can occur which can change the documentation meaning.  Final Clinical Impressions(s) / ED Diagnoses   Final diagnoses:  Shortness of breath  Elevated troponin  Pleural effusion    ED Discharge Orders    None       Lennice Sites, DO 08/25/18 2992    Lennice Sites, DO 08/25/18 1035

## 2018-08-25 NOTE — ED Triage Notes (Signed)
Pt presents for evaluation of sob, s/p heart valve transplant, d/c 2 days ago. Pt also has hx of heart transplant. All medical hx at [facility].

## 2018-08-26 DIAGNOSIS — N186 End stage renal disease: Secondary | ICD-10-CM | POA: Diagnosis not present

## 2018-08-26 DIAGNOSIS — I272 Pulmonary hypertension, unspecified: Secondary | ICD-10-CM | POA: Diagnosis present

## 2018-08-26 DIAGNOSIS — J90 Pleural effusion, not elsewhere classified: Secondary | ICD-10-CM | POA: Diagnosis not present

## 2018-08-26 DIAGNOSIS — E871 Hypo-osmolality and hyponatremia: Secondary | ICD-10-CM | POA: Diagnosis not present

## 2018-08-26 DIAGNOSIS — D72829 Elevated white blood cell count, unspecified: Secondary | ICD-10-CM | POA: Insufficient documentation

## 2018-08-26 DIAGNOSIS — I428 Other cardiomyopathies: Secondary | ICD-10-CM | POA: Diagnosis present

## 2018-08-26 DIAGNOSIS — R509 Fever, unspecified: Secondary | ICD-10-CM | POA: Diagnosis not present

## 2018-08-26 DIAGNOSIS — Z952 Presence of prosthetic heart valve: Secondary | ICD-10-CM | POA: Diagnosis not present

## 2018-08-26 DIAGNOSIS — G8929 Other chronic pain: Secondary | ICD-10-CM | POA: Diagnosis present

## 2018-08-26 DIAGNOSIS — I313 Pericardial effusion (noninflammatory): Secondary | ICD-10-CM | POA: Diagnosis not present

## 2018-08-26 DIAGNOSIS — I351 Nonrheumatic aortic (valve) insufficiency: Secondary | ICD-10-CM | POA: Diagnosis not present

## 2018-08-26 DIAGNOSIS — R5383 Other fatigue: Secondary | ICD-10-CM | POA: Diagnosis not present

## 2018-08-26 DIAGNOSIS — Z941 Heart transplant status: Secondary | ICD-10-CM | POA: Diagnosis not present

## 2018-08-26 DIAGNOSIS — R072 Precordial pain: Secondary | ICD-10-CM | POA: Diagnosis not present

## 2018-08-26 DIAGNOSIS — Z950 Presence of cardiac pacemaker: Secondary | ICD-10-CM | POA: Diagnosis not present

## 2018-08-26 DIAGNOSIS — I442 Atrioventricular block, complete: Secondary | ICD-10-CM | POA: Diagnosis not present

## 2018-08-26 DIAGNOSIS — N189 Chronic kidney disease, unspecified: Secondary | ICD-10-CM | POA: Diagnosis not present

## 2018-08-26 DIAGNOSIS — N184 Chronic kidney disease, stage 4 (severe): Secondary | ICD-10-CM | POA: Diagnosis not present

## 2018-08-26 DIAGNOSIS — E441 Mild protein-calorie malnutrition: Secondary | ICD-10-CM | POA: Diagnosis present

## 2018-08-26 DIAGNOSIS — I517 Cardiomegaly: Secondary | ICD-10-CM | POA: Diagnosis not present

## 2018-08-26 DIAGNOSIS — I361 Nonrheumatic tricuspid (valve) insufficiency: Secondary | ICD-10-CM | POA: Diagnosis not present

## 2018-08-26 DIAGNOSIS — T8189XD Other complications of procedures, not elsewhere classified, subsequent encounter: Secondary | ICD-10-CM | POA: Diagnosis not present

## 2018-08-26 DIAGNOSIS — N179 Acute kidney failure, unspecified: Secondary | ICD-10-CM | POA: Diagnosis not present

## 2018-08-26 DIAGNOSIS — K219 Gastro-esophageal reflux disease without esophagitis: Secondary | ICD-10-CM | POA: Diagnosis present

## 2018-08-26 DIAGNOSIS — K5900 Constipation, unspecified: Secondary | ICD-10-CM | POA: Diagnosis present

## 2018-08-26 DIAGNOSIS — Z79899 Other long term (current) drug therapy: Secondary | ICD-10-CM | POA: Diagnosis not present

## 2018-08-26 DIAGNOSIS — E869 Volume depletion, unspecified: Secondary | ICD-10-CM | POA: Diagnosis not present

## 2018-08-26 DIAGNOSIS — N170 Acute kidney failure with tubular necrosis: Secondary | ICD-10-CM | POA: Diagnosis present

## 2018-08-26 DIAGNOSIS — Z7982 Long term (current) use of aspirin: Secondary | ICD-10-CM | POA: Diagnosis not present

## 2018-08-26 DIAGNOSIS — F17210 Nicotine dependence, cigarettes, uncomplicated: Secondary | ICD-10-CM | POA: Diagnosis not present

## 2018-08-26 DIAGNOSIS — R079 Chest pain, unspecified: Secondary | ICD-10-CM | POA: Diagnosis not present

## 2018-08-26 DIAGNOSIS — R000 Tachycardia, unspecified: Secondary | ICD-10-CM | POA: Diagnosis not present

## 2018-08-26 DIAGNOSIS — R931 Abnormal findings on diagnostic imaging of heart and coronary circulation: Secondary | ICD-10-CM | POA: Diagnosis not present

## 2018-08-26 DIAGNOSIS — R0789 Other chest pain: Secondary | ICD-10-CM | POA: Diagnosis not present

## 2018-08-26 DIAGNOSIS — D649 Anemia, unspecified: Secondary | ICD-10-CM | POA: Diagnosis not present

## 2018-08-26 DIAGNOSIS — I959 Hypotension, unspecified: Secondary | ICD-10-CM | POA: Diagnosis not present

## 2018-08-26 DIAGNOSIS — J942 Hemothorax: Secondary | ICD-10-CM | POA: Diagnosis not present

## 2018-08-26 DIAGNOSIS — I25811 Atherosclerosis of native coronary artery of transplanted heart without angina pectoris: Secondary | ICD-10-CM | POA: Diagnosis present

## 2018-09-01 DIAGNOSIS — Z7982 Long term (current) use of aspirin: Secondary | ICD-10-CM | POA: Diagnosis not present

## 2018-09-01 DIAGNOSIS — I2510 Atherosclerotic heart disease of native coronary artery without angina pectoris: Secondary | ICD-10-CM | POA: Diagnosis not present

## 2018-09-01 DIAGNOSIS — M109 Gout, unspecified: Secondary | ICD-10-CM | POA: Diagnosis not present

## 2018-09-01 DIAGNOSIS — Z9181 History of falling: Secondary | ICD-10-CM | POA: Diagnosis not present

## 2018-09-01 DIAGNOSIS — Z48812 Encounter for surgical aftercare following surgery on the circulatory system: Secondary | ICD-10-CM | POA: Diagnosis not present

## 2018-09-01 DIAGNOSIS — Z941 Heart transplant status: Secondary | ICD-10-CM | POA: Diagnosis not present

## 2018-09-01 DIAGNOSIS — Z85048 Personal history of other malignant neoplasm of rectum, rectosigmoid junction, and anus: Secondary | ICD-10-CM | POA: Diagnosis not present

## 2018-09-01 DIAGNOSIS — F17210 Nicotine dependence, cigarettes, uncomplicated: Secondary | ICD-10-CM | POA: Diagnosis not present

## 2018-09-01 DIAGNOSIS — I130 Hypertensive heart and chronic kidney disease with heart failure and stage 1 through stage 4 chronic kidney disease, or unspecified chronic kidney disease: Secondary | ICD-10-CM | POA: Diagnosis not present

## 2018-09-01 DIAGNOSIS — Z950 Presence of cardiac pacemaker: Secondary | ICD-10-CM | POA: Diagnosis not present

## 2018-09-01 DIAGNOSIS — Z87440 Personal history of urinary (tract) infections: Secondary | ICD-10-CM | POA: Diagnosis not present

## 2018-09-01 DIAGNOSIS — I351 Nonrheumatic aortic (valve) insufficiency: Secondary | ICD-10-CM | POA: Diagnosis not present

## 2018-09-01 DIAGNOSIS — Z79899 Other long term (current) drug therapy: Secondary | ICD-10-CM | POA: Diagnosis not present

## 2018-09-01 DIAGNOSIS — D508 Other iron deficiency anemias: Secondary | ICD-10-CM | POA: Diagnosis not present

## 2018-09-01 DIAGNOSIS — N184 Chronic kidney disease, stage 4 (severe): Secondary | ICD-10-CM | POA: Diagnosis not present

## 2018-09-01 DIAGNOSIS — Z952 Presence of prosthetic heart valve: Secondary | ICD-10-CM | POA: Diagnosis not present

## 2018-09-01 DIAGNOSIS — I509 Heart failure, unspecified: Secondary | ICD-10-CM | POA: Diagnosis not present

## 2018-09-04 DIAGNOSIS — Z48812 Encounter for surgical aftercare following surgery on the circulatory system: Secondary | ICD-10-CM | POA: Diagnosis not present

## 2018-09-04 DIAGNOSIS — I351 Nonrheumatic aortic (valve) insufficiency: Secondary | ICD-10-CM | POA: Diagnosis not present

## 2018-09-04 DIAGNOSIS — I2510 Atherosclerotic heart disease of native coronary artery without angina pectoris: Secondary | ICD-10-CM | POA: Diagnosis not present

## 2018-09-04 DIAGNOSIS — I509 Heart failure, unspecified: Secondary | ICD-10-CM | POA: Diagnosis not present

## 2018-09-04 DIAGNOSIS — N184 Chronic kidney disease, stage 4 (severe): Secondary | ICD-10-CM | POA: Diagnosis not present

## 2018-09-04 DIAGNOSIS — I130 Hypertensive heart and chronic kidney disease with heart failure and stage 1 through stage 4 chronic kidney disease, or unspecified chronic kidney disease: Secondary | ICD-10-CM | POA: Diagnosis not present

## 2018-09-05 DIAGNOSIS — N184 Chronic kidney disease, stage 4 (severe): Secondary | ICD-10-CM | POA: Diagnosis not present

## 2018-09-05 DIAGNOSIS — N186 End stage renal disease: Secondary | ICD-10-CM | POA: Diagnosis not present

## 2018-09-05 DIAGNOSIS — I120 Hypertensive chronic kidney disease with stage 5 chronic kidney disease or end stage renal disease: Secondary | ICD-10-CM | POA: Diagnosis not present

## 2018-09-05 DIAGNOSIS — Z992 Dependence on renal dialysis: Secondary | ICD-10-CM | POA: Diagnosis not present

## 2018-09-05 DIAGNOSIS — Z79899 Other long term (current) drug therapy: Secondary | ICD-10-CM | POA: Diagnosis not present

## 2018-09-06 DIAGNOSIS — I509 Heart failure, unspecified: Secondary | ICD-10-CM | POA: Diagnosis not present

## 2018-09-06 DIAGNOSIS — I2510 Atherosclerotic heart disease of native coronary artery without angina pectoris: Secondary | ICD-10-CM | POA: Diagnosis not present

## 2018-09-06 DIAGNOSIS — Z48812 Encounter for surgical aftercare following surgery on the circulatory system: Secondary | ICD-10-CM | POA: Diagnosis not present

## 2018-09-06 DIAGNOSIS — I351 Nonrheumatic aortic (valve) insufficiency: Secondary | ICD-10-CM | POA: Diagnosis not present

## 2018-09-06 DIAGNOSIS — I130 Hypertensive heart and chronic kidney disease with heart failure and stage 1 through stage 4 chronic kidney disease, or unspecified chronic kidney disease: Secondary | ICD-10-CM | POA: Diagnosis not present

## 2018-09-06 DIAGNOSIS — N184 Chronic kidney disease, stage 4 (severe): Secondary | ICD-10-CM | POA: Diagnosis not present

## 2018-09-07 DIAGNOSIS — I129 Hypertensive chronic kidney disease with stage 1 through stage 4 chronic kidney disease, or unspecified chronic kidney disease: Secondary | ICD-10-CM | POA: Diagnosis not present

## 2018-09-07 DIAGNOSIS — Z87891 Personal history of nicotine dependence: Secondary | ICD-10-CM | POA: Diagnosis not present

## 2018-09-07 DIAGNOSIS — D631 Anemia in chronic kidney disease: Secondary | ICD-10-CM | POA: Diagnosis not present

## 2018-09-07 DIAGNOSIS — N141 Nephropathy induced by other drugs, medicaments and biological substances: Secondary | ICD-10-CM | POA: Diagnosis not present

## 2018-09-07 DIAGNOSIS — B029 Zoster without complications: Secondary | ICD-10-CM | POA: Diagnosis not present

## 2018-09-07 DIAGNOSIS — Z941 Heart transplant status: Secondary | ICD-10-CM | POA: Diagnosis not present

## 2018-09-07 DIAGNOSIS — N184 Chronic kidney disease, stage 4 (severe): Secondary | ICD-10-CM | POA: Diagnosis not present

## 2018-09-07 DIAGNOSIS — N170 Acute kidney failure with tubular necrosis: Secondary | ICD-10-CM | POA: Diagnosis not present

## 2018-09-07 DIAGNOSIS — T451X5S Adverse effect of antineoplastic and immunosuppressive drugs, sequela: Secondary | ICD-10-CM | POA: Diagnosis not present

## 2018-09-08 DIAGNOSIS — N184 Chronic kidney disease, stage 4 (severe): Secondary | ICD-10-CM | POA: Diagnosis not present

## 2018-09-08 DIAGNOSIS — Z48812 Encounter for surgical aftercare following surgery on the circulatory system: Secondary | ICD-10-CM | POA: Diagnosis not present

## 2018-09-08 DIAGNOSIS — I130 Hypertensive heart and chronic kidney disease with heart failure and stage 1 through stage 4 chronic kidney disease, or unspecified chronic kidney disease: Secondary | ICD-10-CM | POA: Diagnosis not present

## 2018-09-08 DIAGNOSIS — I2510 Atherosclerotic heart disease of native coronary artery without angina pectoris: Secondary | ICD-10-CM | POA: Diagnosis not present

## 2018-09-08 DIAGNOSIS — I351 Nonrheumatic aortic (valve) insufficiency: Secondary | ICD-10-CM | POA: Diagnosis not present

## 2018-09-08 DIAGNOSIS — I509 Heart failure, unspecified: Secondary | ICD-10-CM | POA: Diagnosis not present

## 2018-09-11 DIAGNOSIS — I2510 Atherosclerotic heart disease of native coronary artery without angina pectoris: Secondary | ICD-10-CM | POA: Diagnosis not present

## 2018-09-11 DIAGNOSIS — I351 Nonrheumatic aortic (valve) insufficiency: Secondary | ICD-10-CM | POA: Diagnosis not present

## 2018-09-11 DIAGNOSIS — I509 Heart failure, unspecified: Secondary | ICD-10-CM | POA: Diagnosis not present

## 2018-09-11 DIAGNOSIS — Z48812 Encounter for surgical aftercare following surgery on the circulatory system: Secondary | ICD-10-CM | POA: Diagnosis not present

## 2018-09-11 DIAGNOSIS — I130 Hypertensive heart and chronic kidney disease with heart failure and stage 1 through stage 4 chronic kidney disease, or unspecified chronic kidney disease: Secondary | ICD-10-CM | POA: Diagnosis not present

## 2018-09-11 DIAGNOSIS — N184 Chronic kidney disease, stage 4 (severe): Secondary | ICD-10-CM | POA: Diagnosis not present

## 2018-09-12 DIAGNOSIS — N183 Chronic kidney disease, stage 3 (moderate): Secondary | ICD-10-CM | POA: Diagnosis not present

## 2018-09-12 DIAGNOSIS — Z48812 Encounter for surgical aftercare following surgery on the circulatory system: Secondary | ICD-10-CM | POA: Diagnosis not present

## 2018-09-12 DIAGNOSIS — I509 Heart failure, unspecified: Secondary | ICD-10-CM | POA: Diagnosis not present

## 2018-09-12 DIAGNOSIS — Z2821 Immunization not carried out because of patient refusal: Secondary | ICD-10-CM | POA: Diagnosis not present

## 2018-09-12 DIAGNOSIS — I10 Essential (primary) hypertension: Secondary | ICD-10-CM | POA: Diagnosis not present

## 2018-09-12 DIAGNOSIS — Z299 Encounter for prophylactic measures, unspecified: Secondary | ICD-10-CM | POA: Diagnosis not present

## 2018-09-12 DIAGNOSIS — I2510 Atherosclerotic heart disease of native coronary artery without angina pectoris: Secondary | ICD-10-CM | POA: Diagnosis not present

## 2018-09-12 DIAGNOSIS — I25811 Atherosclerosis of native coronary artery of transplanted heart without angina pectoris: Secondary | ICD-10-CM | POA: Diagnosis not present

## 2018-09-12 DIAGNOSIS — N184 Chronic kidney disease, stage 4 (severe): Secondary | ICD-10-CM | POA: Diagnosis not present

## 2018-09-12 DIAGNOSIS — F419 Anxiety disorder, unspecified: Secondary | ICD-10-CM | POA: Diagnosis not present

## 2018-09-12 DIAGNOSIS — I130 Hypertensive heart and chronic kidney disease with heart failure and stage 1 through stage 4 chronic kidney disease, or unspecified chronic kidney disease: Secondary | ICD-10-CM | POA: Diagnosis not present

## 2018-09-12 DIAGNOSIS — I351 Nonrheumatic aortic (valve) insufficiency: Secondary | ICD-10-CM | POA: Diagnosis not present

## 2018-09-12 DIAGNOSIS — Z681 Body mass index (BMI) 19 or less, adult: Secondary | ICD-10-CM | POA: Diagnosis not present

## 2018-09-13 DIAGNOSIS — I130 Hypertensive heart and chronic kidney disease with heart failure and stage 1 through stage 4 chronic kidney disease, or unspecified chronic kidney disease: Secondary | ICD-10-CM | POA: Diagnosis not present

## 2018-09-13 DIAGNOSIS — N184 Chronic kidney disease, stage 4 (severe): Secondary | ICD-10-CM | POA: Diagnosis not present

## 2018-09-13 DIAGNOSIS — I509 Heart failure, unspecified: Secondary | ICD-10-CM | POA: Diagnosis not present

## 2018-09-13 DIAGNOSIS — I351 Nonrheumatic aortic (valve) insufficiency: Secondary | ICD-10-CM | POA: Diagnosis not present

## 2018-09-13 DIAGNOSIS — Z48812 Encounter for surgical aftercare following surgery on the circulatory system: Secondary | ICD-10-CM | POA: Diagnosis not present

## 2018-09-13 DIAGNOSIS — I2510 Atherosclerotic heart disease of native coronary artery without angina pectoris: Secondary | ICD-10-CM | POA: Diagnosis not present

## 2018-09-14 DIAGNOSIS — I272 Pulmonary hypertension, unspecified: Secondary | ICD-10-CM | POA: Diagnosis not present

## 2018-09-14 DIAGNOSIS — Z4821 Encounter for aftercare following heart transplant: Secondary | ICD-10-CM | POA: Diagnosis not present

## 2018-09-14 DIAGNOSIS — I25811 Atherosclerosis of native coronary artery of transplanted heart without angina pectoris: Secondary | ICD-10-CM | POA: Diagnosis not present

## 2018-09-14 DIAGNOSIS — Z941 Heart transplant status: Secondary | ICD-10-CM | POA: Diagnosis not present

## 2018-09-15 DIAGNOSIS — I130 Hypertensive heart and chronic kidney disease with heart failure and stage 1 through stage 4 chronic kidney disease, or unspecified chronic kidney disease: Secondary | ICD-10-CM | POA: Diagnosis not present

## 2018-09-15 DIAGNOSIS — N184 Chronic kidney disease, stage 4 (severe): Secondary | ICD-10-CM | POA: Diagnosis not present

## 2018-09-15 DIAGNOSIS — I509 Heart failure, unspecified: Secondary | ICD-10-CM | POA: Diagnosis not present

## 2018-09-15 DIAGNOSIS — Z48812 Encounter for surgical aftercare following surgery on the circulatory system: Secondary | ICD-10-CM | POA: Diagnosis not present

## 2018-09-15 DIAGNOSIS — I2510 Atherosclerotic heart disease of native coronary artery without angina pectoris: Secondary | ICD-10-CM | POA: Diagnosis not present

## 2018-09-15 DIAGNOSIS — I351 Nonrheumatic aortic (valve) insufficiency: Secondary | ICD-10-CM | POA: Diagnosis not present

## 2018-09-18 DIAGNOSIS — R5383 Other fatigue: Secondary | ICD-10-CM | POA: Diagnosis not present

## 2018-09-18 DIAGNOSIS — R197 Diarrhea, unspecified: Secondary | ICD-10-CM | POA: Diagnosis not present

## 2018-09-18 DIAGNOSIS — I509 Heart failure, unspecified: Secondary | ICD-10-CM | POA: Diagnosis not present

## 2018-09-18 DIAGNOSIS — I130 Hypertensive heart and chronic kidney disease with heart failure and stage 1 through stage 4 chronic kidney disease, or unspecified chronic kidney disease: Secondary | ICD-10-CM | POA: Diagnosis not present

## 2018-09-18 DIAGNOSIS — Z48812 Encounter for surgical aftercare following surgery on the circulatory system: Secondary | ICD-10-CM | POA: Diagnosis not present

## 2018-09-18 DIAGNOSIS — I2510 Atherosclerotic heart disease of native coronary artery without angina pectoris: Secondary | ICD-10-CM | POA: Diagnosis not present

## 2018-09-18 DIAGNOSIS — Z681 Body mass index (BMI) 19 or less, adult: Secondary | ICD-10-CM | POA: Diagnosis not present

## 2018-09-18 DIAGNOSIS — Z79899 Other long term (current) drug therapy: Secondary | ICD-10-CM | POA: Diagnosis not present

## 2018-09-18 DIAGNOSIS — N184 Chronic kidney disease, stage 4 (severe): Secondary | ICD-10-CM | POA: Diagnosis not present

## 2018-09-18 DIAGNOSIS — I120 Hypertensive chronic kidney disease with stage 5 chronic kidney disease or end stage renal disease: Secondary | ICD-10-CM | POA: Diagnosis not present

## 2018-09-18 DIAGNOSIS — A09 Infectious gastroenteritis and colitis, unspecified: Secondary | ICD-10-CM | POA: Diagnosis not present

## 2018-09-18 DIAGNOSIS — J90 Pleural effusion, not elsewhere classified: Secondary | ICD-10-CM | POA: Diagnosis not present

## 2018-09-18 DIAGNOSIS — E860 Dehydration: Secondary | ICD-10-CM | POA: Diagnosis not present

## 2018-09-18 DIAGNOSIS — Z952 Presence of prosthetic heart valve: Secondary | ICD-10-CM | POA: Diagnosis not present

## 2018-09-18 DIAGNOSIS — E875 Hyperkalemia: Secondary | ICD-10-CM | POA: Diagnosis not present

## 2018-09-18 DIAGNOSIS — K2970 Gastritis, unspecified, without bleeding: Secondary | ICD-10-CM | POA: Diagnosis not present

## 2018-09-18 DIAGNOSIS — Z941 Heart transplant status: Secondary | ICD-10-CM | POA: Diagnosis not present

## 2018-09-18 DIAGNOSIS — R110 Nausea: Secondary | ICD-10-CM | POA: Diagnosis not present

## 2018-09-18 DIAGNOSIS — I351 Nonrheumatic aortic (valve) insufficiency: Secondary | ICD-10-CM | POA: Diagnosis not present

## 2018-09-18 DIAGNOSIS — I25811 Atherosclerosis of native coronary artery of transplanted heart without angina pectoris: Secondary | ICD-10-CM | POA: Diagnosis not present

## 2018-09-18 DIAGNOSIS — E43 Unspecified severe protein-calorie malnutrition: Secondary | ICD-10-CM | POA: Diagnosis not present

## 2018-09-18 DIAGNOSIS — N185 Chronic kidney disease, stage 5: Secondary | ICD-10-CM | POA: Diagnosis not present

## 2018-09-19 DIAGNOSIS — I129 Hypertensive chronic kidney disease with stage 1 through stage 4 chronic kidney disease, or unspecified chronic kidney disease: Secondary | ICD-10-CM | POA: Diagnosis not present

## 2018-09-19 DIAGNOSIS — R600 Localized edema: Secondary | ICD-10-CM | POA: Diagnosis not present

## 2018-09-19 DIAGNOSIS — K921 Melena: Secondary | ICD-10-CM | POA: Diagnosis not present

## 2018-09-19 DIAGNOSIS — R197 Diarrhea, unspecified: Secondary | ICD-10-CM | POA: Diagnosis not present

## 2018-09-19 DIAGNOSIS — R109 Unspecified abdominal pain: Secondary | ICD-10-CM | POA: Diagnosis not present

## 2018-09-19 DIAGNOSIS — R630 Anorexia: Secondary | ICD-10-CM | POA: Diagnosis not present

## 2018-09-19 DIAGNOSIS — K2930 Chronic superficial gastritis without bleeding: Secondary | ICD-10-CM | POA: Diagnosis not present

## 2018-09-19 DIAGNOSIS — E43 Unspecified severe protein-calorie malnutrition: Secondary | ICD-10-CM | POA: Diagnosis not present

## 2018-09-19 DIAGNOSIS — I4510 Unspecified right bundle-branch block: Secondary | ICD-10-CM | POA: Diagnosis not present

## 2018-09-19 DIAGNOSIS — Z941 Heart transplant status: Secondary | ICD-10-CM | POA: Diagnosis not present

## 2018-09-19 DIAGNOSIS — K5289 Other specified noninfective gastroenteritis and colitis: Secondary | ICD-10-CM | POA: Diagnosis not present

## 2018-09-19 DIAGNOSIS — R112 Nausea with vomiting, unspecified: Secondary | ICD-10-CM | POA: Diagnosis not present

## 2018-09-19 DIAGNOSIS — N184 Chronic kidney disease, stage 4 (severe): Secondary | ICD-10-CM | POA: Diagnosis not present

## 2018-09-19 DIAGNOSIS — R933 Abnormal findings on diagnostic imaging of other parts of digestive tract: Secondary | ICD-10-CM | POA: Diagnosis not present

## 2018-09-19 DIAGNOSIS — R9431 Abnormal electrocardiogram [ECG] [EKG]: Secondary | ICD-10-CM | POA: Diagnosis not present

## 2018-09-19 DIAGNOSIS — R1030 Lower abdominal pain, unspecified: Secondary | ICD-10-CM | POA: Diagnosis not present

## 2018-09-19 DIAGNOSIS — D72829 Elevated white blood cell count, unspecified: Secondary | ICD-10-CM | POA: Diagnosis not present

## 2018-09-19 DIAGNOSIS — R1084 Generalized abdominal pain: Secondary | ICD-10-CM | POA: Diagnosis not present

## 2018-09-19 DIAGNOSIS — K2961 Other gastritis with bleeding: Secondary | ICD-10-CM | POA: Diagnosis not present

## 2018-09-19 DIAGNOSIS — Z681 Body mass index (BMI) 19 or less, adult: Secondary | ICD-10-CM | POA: Diagnosis not present

## 2018-09-19 DIAGNOSIS — K222 Esophageal obstruction: Secondary | ICD-10-CM | POA: Diagnosis not present

## 2018-09-19 DIAGNOSIS — E875 Hyperkalemia: Secondary | ICD-10-CM | POA: Diagnosis not present

## 2018-09-19 DIAGNOSIS — R110 Nausea: Secondary | ICD-10-CM | POA: Diagnosis not present

## 2018-09-19 DIAGNOSIS — N185 Chronic kidney disease, stage 5: Secondary | ICD-10-CM | POA: Diagnosis present

## 2018-09-19 DIAGNOSIS — K2950 Unspecified chronic gastritis without bleeding: Secondary | ICD-10-CM | POA: Diagnosis not present

## 2018-09-19 DIAGNOSIS — I440 Atrioventricular block, first degree: Secondary | ICD-10-CM | POA: Diagnosis not present

## 2018-09-19 DIAGNOSIS — Z9225 Personal history of immunosupression therapy: Secondary | ICD-10-CM | POA: Diagnosis not present

## 2018-09-19 DIAGNOSIS — Z7982 Long term (current) use of aspirin: Secondary | ICD-10-CM | POA: Diagnosis not present

## 2018-09-19 DIAGNOSIS — K5730 Diverticulosis of large intestine without perforation or abscess without bleeding: Secondary | ICD-10-CM | POA: Diagnosis not present

## 2018-09-19 DIAGNOSIS — T86290 Cardiac allograft vasculopathy: Secondary | ICD-10-CM | POA: Diagnosis not present

## 2018-09-19 DIAGNOSIS — R634 Abnormal weight loss: Secondary | ICD-10-CM | POA: Diagnosis not present

## 2018-09-19 DIAGNOSIS — Z952 Presence of prosthetic heart valve: Secondary | ICD-10-CM | POA: Diagnosis not present

## 2018-09-19 DIAGNOSIS — R638 Other symptoms and signs concerning food and fluid intake: Secondary | ICD-10-CM | POA: Diagnosis not present

## 2018-09-19 DIAGNOSIS — I25811 Atherosclerosis of native coronary artery of transplanted heart without angina pectoris: Secondary | ICD-10-CM | POA: Diagnosis present

## 2018-09-19 DIAGNOSIS — I517 Cardiomegaly: Secondary | ICD-10-CM | POA: Diagnosis not present

## 2018-09-19 DIAGNOSIS — K2980 Duodenitis without bleeding: Secondary | ICD-10-CM | POA: Diagnosis not present

## 2018-09-19 DIAGNOSIS — K2970 Gastritis, unspecified, without bleeding: Secondary | ICD-10-CM | POA: Diagnosis not present

## 2018-09-19 DIAGNOSIS — E871 Hypo-osmolality and hyponatremia: Secondary | ICD-10-CM | POA: Diagnosis not present

## 2018-09-19 DIAGNOSIS — E872 Acidosis: Secondary | ICD-10-CM | POA: Diagnosis not present

## 2018-09-19 DIAGNOSIS — I120 Hypertensive chronic kidney disease with stage 5 chronic kidney disease or end stage renal disease: Secondary | ICD-10-CM | POA: Diagnosis present

## 2018-09-19 DIAGNOSIS — B009 Herpesviral infection, unspecified: Secondary | ICD-10-CM | POA: Diagnosis not present

## 2018-09-19 DIAGNOSIS — E860 Dehydration: Secondary | ICD-10-CM | POA: Diagnosis present

## 2018-09-19 DIAGNOSIS — I351 Nonrheumatic aortic (valve) insufficiency: Secondary | ICD-10-CM | POA: Diagnosis present

## 2018-09-19 DIAGNOSIS — Z8719 Personal history of other diseases of the digestive system: Secondary | ICD-10-CM | POA: Diagnosis not present

## 2018-09-19 DIAGNOSIS — K529 Noninfective gastroenteritis and colitis, unspecified: Secondary | ICD-10-CM | POA: Diagnosis not present

## 2018-10-02 DIAGNOSIS — Z4821 Encounter for aftercare following heart transplant: Secondary | ICD-10-CM | POA: Diagnosis not present

## 2018-10-02 DIAGNOSIS — Z79899 Other long term (current) drug therapy: Secondary | ICD-10-CM | POA: Diagnosis not present

## 2018-10-02 DIAGNOSIS — Z87891 Personal history of nicotine dependence: Secondary | ICD-10-CM | POA: Diagnosis not present

## 2018-10-20 DIAGNOSIS — Z79899 Other long term (current) drug therapy: Secondary | ICD-10-CM | POA: Diagnosis not present

## 2018-10-20 DIAGNOSIS — I25811 Atherosclerosis of native coronary artery of transplanted heart without angina pectoris: Secondary | ICD-10-CM | POA: Diagnosis not present

## 2018-10-20 DIAGNOSIS — B029 Zoster without complications: Secondary | ICD-10-CM | POA: Diagnosis not present

## 2018-10-20 DIAGNOSIS — N183 Chronic kidney disease, stage 3 (moderate): Secondary | ICD-10-CM | POA: Diagnosis not present

## 2018-10-20 DIAGNOSIS — I130 Hypertensive heart and chronic kidney disease with heart failure and stage 1 through stage 4 chronic kidney disease, or unspecified chronic kidney disease: Secondary | ICD-10-CM | POA: Diagnosis not present

## 2018-10-20 DIAGNOSIS — I129 Hypertensive chronic kidney disease with stage 1 through stage 4 chronic kidney disease, or unspecified chronic kidney disease: Secondary | ICD-10-CM | POA: Diagnosis not present

## 2018-10-20 DIAGNOSIS — E8809 Other disorders of plasma-protein metabolism, not elsewhere classified: Secondary | ICD-10-CM | POA: Diagnosis not present

## 2018-10-20 DIAGNOSIS — I455 Other specified heart block: Secondary | ICD-10-CM | POA: Diagnosis not present

## 2018-10-20 DIAGNOSIS — D631 Anemia in chronic kidney disease: Secondary | ICD-10-CM | POA: Diagnosis not present

## 2018-10-20 DIAGNOSIS — Z941 Heart transplant status: Secondary | ICD-10-CM | POA: Diagnosis not present

## 2018-10-20 DIAGNOSIS — N184 Chronic kidney disease, stage 4 (severe): Secondary | ICD-10-CM | POA: Diagnosis not present

## 2018-10-20 DIAGNOSIS — R600 Localized edema: Secondary | ICD-10-CM | POA: Diagnosis not present

## 2018-10-20 DIAGNOSIS — Z950 Presence of cardiac pacemaker: Secondary | ICD-10-CM | POA: Diagnosis not present

## 2018-10-29 DIAGNOSIS — M109 Gout, unspecified: Secondary | ICD-10-CM | POA: Diagnosis not present

## 2018-10-29 DIAGNOSIS — I351 Nonrheumatic aortic (valve) insufficiency: Secondary | ICD-10-CM | POA: Diagnosis not present

## 2018-10-29 DIAGNOSIS — Z48812 Encounter for surgical aftercare following surgery on the circulatory system: Secondary | ICD-10-CM | POA: Diagnosis not present

## 2018-11-17 DIAGNOSIS — Z299 Encounter for prophylactic measures, unspecified: Secondary | ICD-10-CM | POA: Diagnosis not present

## 2018-11-17 DIAGNOSIS — R350 Frequency of micturition: Secondary | ICD-10-CM | POA: Diagnosis not present

## 2018-11-17 DIAGNOSIS — I10 Essential (primary) hypertension: Secondary | ICD-10-CM | POA: Diagnosis not present

## 2018-11-17 DIAGNOSIS — Z681 Body mass index (BMI) 19 or less, adult: Secondary | ICD-10-CM | POA: Diagnosis not present

## 2018-11-17 DIAGNOSIS — N390 Urinary tract infection, site not specified: Secondary | ICD-10-CM | POA: Diagnosis not present

## 2018-11-17 DIAGNOSIS — K8050 Calculus of bile duct without cholangitis or cholecystitis without obstruction: Secondary | ICD-10-CM | POA: Diagnosis not present

## 2018-11-17 DIAGNOSIS — F17210 Nicotine dependence, cigarettes, uncomplicated: Secondary | ICD-10-CM | POA: Diagnosis not present

## 2018-11-21 DIAGNOSIS — I10 Essential (primary) hypertension: Secondary | ICD-10-CM | POA: Diagnosis not present

## 2018-11-21 DIAGNOSIS — Z299 Encounter for prophylactic measures, unspecified: Secondary | ICD-10-CM | POA: Diagnosis not present

## 2018-11-21 DIAGNOSIS — Z681 Body mass index (BMI) 19 or less, adult: Secondary | ICD-10-CM | POA: Diagnosis not present

## 2018-11-21 DIAGNOSIS — N390 Urinary tract infection, site not specified: Secondary | ICD-10-CM | POA: Diagnosis not present

## 2018-11-29 DIAGNOSIS — Z87891 Personal history of nicotine dependence: Secondary | ICD-10-CM | POA: Diagnosis not present

## 2018-11-29 DIAGNOSIS — Z79899 Other long term (current) drug therapy: Secondary | ICD-10-CM | POA: Diagnosis not present

## 2018-11-29 DIAGNOSIS — I129 Hypertensive chronic kidney disease with stage 1 through stage 4 chronic kidney disease, or unspecified chronic kidney disease: Secondary | ICD-10-CM | POA: Diagnosis not present

## 2018-11-29 DIAGNOSIS — Z952 Presence of prosthetic heart valve: Secondary | ICD-10-CM | POA: Diagnosis not present

## 2018-11-29 DIAGNOSIS — I770 Arteriovenous fistula, acquired: Secondary | ICD-10-CM | POA: Diagnosis not present

## 2018-11-29 DIAGNOSIS — N3000 Acute cystitis without hematuria: Secondary | ICD-10-CM | POA: Diagnosis not present

## 2018-11-29 DIAGNOSIS — N184 Chronic kidney disease, stage 4 (severe): Secondary | ICD-10-CM | POA: Diagnosis not present

## 2018-11-29 DIAGNOSIS — D649 Anemia, unspecified: Secondary | ICD-10-CM | POA: Diagnosis not present

## 2018-11-29 DIAGNOSIS — J439 Emphysema, unspecified: Secondary | ICD-10-CM | POA: Diagnosis not present

## 2018-11-29 DIAGNOSIS — K219 Gastro-esophageal reflux disease without esophagitis: Secondary | ICD-10-CM | POA: Diagnosis not present

## 2018-11-29 DIAGNOSIS — R634 Abnormal weight loss: Secondary | ICD-10-CM | POA: Diagnosis not present

## 2018-11-29 DIAGNOSIS — Z4821 Encounter for aftercare following heart transplant: Secondary | ICD-10-CM | POA: Diagnosis not present

## 2018-12-01 DIAGNOSIS — E7800 Pure hypercholesterolemia, unspecified: Secondary | ICD-10-CM | POA: Diagnosis not present

## 2018-12-01 DIAGNOSIS — I10 Essential (primary) hypertension: Secondary | ICD-10-CM | POA: Diagnosis not present

## 2018-12-01 DIAGNOSIS — Z79899 Other long term (current) drug therapy: Secondary | ICD-10-CM | POA: Diagnosis not present

## 2018-12-01 DIAGNOSIS — Z941 Heart transplant status: Secondary | ICD-10-CM | POA: Diagnosis not present

## 2018-12-01 DIAGNOSIS — Z48812 Encounter for surgical aftercare following surgery on the circulatory system: Secondary | ICD-10-CM | POA: Diagnosis not present

## 2018-12-13 DIAGNOSIS — Z5181 Encounter for therapeutic drug level monitoring: Secondary | ICD-10-CM | POA: Diagnosis not present

## 2018-12-13 DIAGNOSIS — Z79899 Other long term (current) drug therapy: Secondary | ICD-10-CM | POA: Diagnosis not present

## 2018-12-20 DIAGNOSIS — N184 Chronic kidney disease, stage 4 (severe): Secondary | ICD-10-CM | POA: Diagnosis not present

## 2018-12-21 DIAGNOSIS — N390 Urinary tract infection, site not specified: Secondary | ICD-10-CM | POA: Diagnosis not present

## 2018-12-21 DIAGNOSIS — D631 Anemia in chronic kidney disease: Secondary | ICD-10-CM | POA: Diagnosis not present

## 2018-12-21 DIAGNOSIS — Z952 Presence of prosthetic heart valve: Secondary | ICD-10-CM | POA: Diagnosis not present

## 2018-12-21 DIAGNOSIS — R0602 Shortness of breath: Secondary | ICD-10-CM | POA: Diagnosis not present

## 2018-12-21 DIAGNOSIS — N281 Cyst of kidney, acquired: Secondary | ICD-10-CM | POA: Diagnosis not present

## 2018-12-21 DIAGNOSIS — Z950 Presence of cardiac pacemaker: Secondary | ICD-10-CM | POA: Diagnosis not present

## 2018-12-21 DIAGNOSIS — J90 Pleural effusion, not elsewhere classified: Secondary | ICD-10-CM | POA: Diagnosis not present

## 2018-12-21 DIAGNOSIS — D649 Anemia, unspecified: Secondary | ICD-10-CM | POA: Diagnosis not present

## 2018-12-21 DIAGNOSIS — Z7982 Long term (current) use of aspirin: Secondary | ICD-10-CM | POA: Diagnosis not present

## 2018-12-21 DIAGNOSIS — I129 Hypertensive chronic kidney disease with stage 1 through stage 4 chronic kidney disease, or unspecified chronic kidney disease: Secondary | ICD-10-CM | POA: Diagnosis present

## 2018-12-21 DIAGNOSIS — Z941 Heart transplant status: Secondary | ICD-10-CM | POA: Diagnosis not present

## 2018-12-21 DIAGNOSIS — R300 Dysuria: Secondary | ICD-10-CM | POA: Diagnosis not present

## 2018-12-21 DIAGNOSIS — A419 Sepsis, unspecified organism: Secondary | ICD-10-CM | POA: Diagnosis not present

## 2018-12-21 DIAGNOSIS — N184 Chronic kidney disease, stage 4 (severe): Secondary | ICD-10-CM | POA: Diagnosis not present

## 2018-12-21 DIAGNOSIS — I428 Other cardiomyopathies: Secondary | ICD-10-CM | POA: Diagnosis not present

## 2018-12-21 DIAGNOSIS — I442 Atrioventricular block, complete: Secondary | ICD-10-CM | POA: Diagnosis not present

## 2018-12-21 DIAGNOSIS — I25811 Atherosclerosis of native coronary artery of transplanted heart without angina pectoris: Secondary | ICD-10-CM | POA: Diagnosis present

## 2018-12-21 DIAGNOSIS — Z87891 Personal history of nicotine dependence: Secondary | ICD-10-CM | POA: Diagnosis not present

## 2019-01-04 DIAGNOSIS — N184 Chronic kidney disease, stage 4 (severe): Secondary | ICD-10-CM | POA: Diagnosis not present

## 2019-01-04 DIAGNOSIS — D631 Anemia in chronic kidney disease: Secondary | ICD-10-CM | POA: Diagnosis not present

## 2019-01-04 DIAGNOSIS — I129 Hypertensive chronic kidney disease with stage 1 through stage 4 chronic kidney disease, or unspecified chronic kidney disease: Secondary | ICD-10-CM | POA: Diagnosis not present

## 2019-01-04 DIAGNOSIS — I871 Compression of vein: Secondary | ICD-10-CM | POA: Diagnosis not present

## 2019-01-04 DIAGNOSIS — T82590A Other mechanical complication of surgically created arteriovenous fistula, initial encounter: Secondary | ICD-10-CM | POA: Diagnosis not present

## 2019-01-09 DIAGNOSIS — N183 Chronic kidney disease, stage 3 (moderate): Secondary | ICD-10-CM | POA: Diagnosis not present

## 2019-01-09 DIAGNOSIS — Z299 Encounter for prophylactic measures, unspecified: Secondary | ICD-10-CM | POA: Diagnosis not present

## 2019-01-09 DIAGNOSIS — J069 Acute upper respiratory infection, unspecified: Secondary | ICD-10-CM | POA: Diagnosis not present

## 2019-01-09 DIAGNOSIS — Z87891 Personal history of nicotine dependence: Secondary | ICD-10-CM | POA: Diagnosis not present

## 2019-01-09 DIAGNOSIS — I10 Essential (primary) hypertension: Secondary | ICD-10-CM | POA: Diagnosis not present

## 2019-01-09 DIAGNOSIS — D582 Other hemoglobinopathies: Secondary | ICD-10-CM | POA: Diagnosis not present

## 2019-01-09 DIAGNOSIS — Z6821 Body mass index (BMI) 21.0-21.9, adult: Secondary | ICD-10-CM | POA: Diagnosis not present

## 2019-01-17 DIAGNOSIS — Z79899 Other long term (current) drug therapy: Secondary | ICD-10-CM | POA: Diagnosis not present

## 2019-01-17 DIAGNOSIS — Z941 Heart transplant status: Secondary | ICD-10-CM | POA: Diagnosis not present

## 2019-01-17 DIAGNOSIS — N184 Chronic kidney disease, stage 4 (severe): Secondary | ICD-10-CM | POA: Diagnosis not present

## 2019-01-22 DIAGNOSIS — Z941 Heart transplant status: Secondary | ICD-10-CM | POA: Diagnosis not present

## 2019-01-22 DIAGNOSIS — Z87891 Personal history of nicotine dependence: Secondary | ICD-10-CM | POA: Diagnosis not present

## 2019-01-22 DIAGNOSIS — J189 Pneumonia, unspecified organism: Secondary | ICD-10-CM | POA: Diagnosis not present

## 2019-01-22 DIAGNOSIS — Z952 Presence of prosthetic heart valve: Secondary | ICD-10-CM | POA: Diagnosis not present

## 2019-01-22 DIAGNOSIS — D649 Anemia, unspecified: Secondary | ICD-10-CM | POA: Diagnosis not present

## 2019-01-22 DIAGNOSIS — I509 Heart failure, unspecified: Secondary | ICD-10-CM | POA: Diagnosis not present

## 2019-01-22 DIAGNOSIS — I10 Essential (primary) hypertension: Secondary | ICD-10-CM | POA: Diagnosis not present

## 2019-01-22 DIAGNOSIS — I351 Nonrheumatic aortic (valve) insufficiency: Secondary | ICD-10-CM | POA: Diagnosis not present

## 2019-01-22 DIAGNOSIS — Z7982 Long term (current) use of aspirin: Secondary | ICD-10-CM | POA: Diagnosis not present

## 2019-01-22 DIAGNOSIS — R0902 Hypoxemia: Secondary | ICD-10-CM | POA: Diagnosis not present

## 2019-01-22 DIAGNOSIS — Z8639 Personal history of other endocrine, nutritional and metabolic disease: Secondary | ICD-10-CM | POA: Diagnosis not present

## 2019-01-22 DIAGNOSIS — R0600 Dyspnea, unspecified: Secondary | ICD-10-CM | POA: Diagnosis not present

## 2019-01-22 DIAGNOSIS — R05 Cough: Secondary | ICD-10-CM | POA: Diagnosis not present

## 2019-01-22 DIAGNOSIS — R0602 Shortness of breath: Secondary | ICD-10-CM | POA: Diagnosis not present

## 2019-01-22 DIAGNOSIS — Z79899 Other long term (current) drug therapy: Secondary | ICD-10-CM | POA: Diagnosis not present

## 2019-01-22 DIAGNOSIS — N189 Chronic kidney disease, unspecified: Secondary | ICD-10-CM | POA: Diagnosis not present

## 2019-01-22 DIAGNOSIS — I361 Nonrheumatic tricuspid (valve) insufficiency: Secondary | ICD-10-CM | POA: Diagnosis not present

## 2019-01-22 DIAGNOSIS — R0989 Other specified symptoms and signs involving the circulatory and respiratory systems: Secondary | ICD-10-CM | POA: Diagnosis not present

## 2019-01-22 DIAGNOSIS — I110 Hypertensive heart disease with heart failure: Secondary | ICD-10-CM | POA: Diagnosis not present

## 2019-01-22 DIAGNOSIS — I129 Hypertensive chronic kidney disease with stage 1 through stage 4 chronic kidney disease, or unspecified chronic kidney disease: Secondary | ICD-10-CM | POA: Diagnosis not present

## 2019-01-22 DIAGNOSIS — I340 Nonrheumatic mitral (valve) insufficiency: Secondary | ICD-10-CM | POA: Diagnosis not present

## 2019-01-23 DIAGNOSIS — I351 Nonrheumatic aortic (valve) insufficiency: Secondary | ICD-10-CM | POA: Diagnosis not present

## 2019-01-23 DIAGNOSIS — D631 Anemia in chronic kidney disease: Secondary | ICD-10-CM | POA: Diagnosis not present

## 2019-01-23 DIAGNOSIS — I361 Nonrheumatic tricuspid (valve) insufficiency: Secondary | ICD-10-CM | POA: Diagnosis not present

## 2019-01-23 DIAGNOSIS — I272 Pulmonary hypertension, unspecified: Secondary | ICD-10-CM | POA: Diagnosis not present

## 2019-01-23 DIAGNOSIS — E872 Acidosis: Secondary | ICD-10-CM | POA: Diagnosis not present

## 2019-01-23 DIAGNOSIS — T8201XA Breakdown (mechanical) of heart valve prosthesis, initial encounter: Secondary | ICD-10-CM | POA: Diagnosis not present

## 2019-01-23 DIAGNOSIS — Z79899 Other long term (current) drug therapy: Secondary | ICD-10-CM | POA: Diagnosis not present

## 2019-01-23 DIAGNOSIS — Z950 Presence of cardiac pacemaker: Secondary | ICD-10-CM | POA: Diagnosis not present

## 2019-01-23 DIAGNOSIS — B348 Other viral infections of unspecified site: Secondary | ICD-10-CM | POA: Diagnosis not present

## 2019-01-23 DIAGNOSIS — I428 Other cardiomyopathies: Secondary | ICD-10-CM | POA: Diagnosis not present

## 2019-01-23 DIAGNOSIS — J069 Acute upper respiratory infection, unspecified: Secondary | ICD-10-CM | POA: Diagnosis not present

## 2019-01-23 DIAGNOSIS — Z941 Heart transplant status: Secondary | ICD-10-CM | POA: Diagnosis not present

## 2019-01-23 DIAGNOSIS — N184 Chronic kidney disease, stage 4 (severe): Secondary | ICD-10-CM | POA: Diagnosis not present

## 2019-01-23 DIAGNOSIS — Z9889 Other specified postprocedural states: Secondary | ICD-10-CM | POA: Diagnosis not present

## 2019-01-23 DIAGNOSIS — I358 Other nonrheumatic aortic valve disorders: Secondary | ICD-10-CM | POA: Diagnosis not present

## 2019-01-23 DIAGNOSIS — I340 Nonrheumatic mitral (valve) insufficiency: Secondary | ICD-10-CM | POA: Diagnosis not present

## 2019-01-23 DIAGNOSIS — I509 Heart failure, unspecified: Secondary | ICD-10-CM | POA: Diagnosis not present

## 2019-01-23 DIAGNOSIS — Z952 Presence of prosthetic heart valve: Secondary | ICD-10-CM | POA: Diagnosis not present

## 2019-01-23 DIAGNOSIS — I129 Hypertensive chronic kidney disease with stage 1 through stage 4 chronic kidney disease, or unspecified chronic kidney disease: Secondary | ICD-10-CM | POA: Diagnosis not present

## 2019-01-23 DIAGNOSIS — D72829 Elevated white blood cell count, unspecified: Secondary | ICD-10-CM | POA: Diagnosis not present

## 2019-01-23 DIAGNOSIS — N179 Acute kidney failure, unspecified: Secondary | ICD-10-CM | POA: Diagnosis not present

## 2019-01-23 DIAGNOSIS — R64 Cachexia: Secondary | ICD-10-CM | POA: Diagnosis not present

## 2019-01-23 DIAGNOSIS — B341 Enterovirus infection, unspecified: Secondary | ICD-10-CM | POA: Diagnosis not present

## 2019-01-24 DIAGNOSIS — Z7901 Long term (current) use of anticoagulants: Secondary | ICD-10-CM | POA: Diagnosis not present

## 2019-01-24 DIAGNOSIS — Z01818 Encounter for other preprocedural examination: Secondary | ICD-10-CM | POA: Diagnosis not present

## 2019-01-24 DIAGNOSIS — D72829 Elevated white blood cell count, unspecified: Secondary | ICD-10-CM | POA: Diagnosis not present

## 2019-01-24 DIAGNOSIS — Z7189 Other specified counseling: Secondary | ICD-10-CM | POA: Diagnosis not present

## 2019-01-24 DIAGNOSIS — I351 Nonrheumatic aortic (valve) insufficiency: Secondary | ICD-10-CM | POA: Diagnosis not present

## 2019-01-24 DIAGNOSIS — E8809 Other disorders of plasma-protein metabolism, not elsewhere classified: Secondary | ICD-10-CM | POA: Diagnosis not present

## 2019-01-24 DIAGNOSIS — D899 Disorder involving the immune mechanism, unspecified: Secondary | ICD-10-CM | POA: Diagnosis not present

## 2019-01-24 DIAGNOSIS — T8201XA Breakdown (mechanical) of heart valve prosthesis, initial encounter: Secondary | ICD-10-CM | POA: Diagnosis not present

## 2019-01-24 DIAGNOSIS — E872 Acidosis: Secondary | ICD-10-CM | POA: Diagnosis not present

## 2019-01-24 DIAGNOSIS — I38 Endocarditis, valve unspecified: Secondary | ICD-10-CM | POA: Diagnosis not present

## 2019-01-24 DIAGNOSIS — Z941 Heart transplant status: Secondary | ICD-10-CM | POA: Diagnosis not present

## 2019-01-24 DIAGNOSIS — Z952 Presence of prosthetic heart valve: Secondary | ICD-10-CM | POA: Diagnosis not present

## 2019-01-24 DIAGNOSIS — J069 Acute upper respiratory infection, unspecified: Secondary | ICD-10-CM | POA: Diagnosis not present

## 2019-01-24 DIAGNOSIS — B341 Enterovirus infection, unspecified: Secondary | ICD-10-CM | POA: Diagnosis not present

## 2019-01-24 DIAGNOSIS — Z515 Encounter for palliative care: Secondary | ICD-10-CM | POA: Diagnosis not present

## 2019-01-24 DIAGNOSIS — Z79899 Other long term (current) drug therapy: Secondary | ICD-10-CM | POA: Diagnosis not present

## 2019-01-24 DIAGNOSIS — Z950 Presence of cardiac pacemaker: Secondary | ICD-10-CM | POA: Diagnosis not present

## 2019-01-24 DIAGNOSIS — I428 Other cardiomyopathies: Secondary | ICD-10-CM | POA: Diagnosis not present

## 2019-01-24 DIAGNOSIS — I358 Other nonrheumatic aortic valve disorders: Secondary | ICD-10-CM | POA: Diagnosis present

## 2019-01-24 DIAGNOSIS — N184 Chronic kidney disease, stage 4 (severe): Secondary | ICD-10-CM | POA: Diagnosis not present

## 2019-01-24 DIAGNOSIS — I129 Hypertensive chronic kidney disease with stage 1 through stage 4 chronic kidney disease, or unspecified chronic kidney disease: Secondary | ICD-10-CM | POA: Diagnosis not present

## 2019-01-24 DIAGNOSIS — B348 Other viral infections of unspecified site: Secondary | ICD-10-CM | POA: Diagnosis not present

## 2019-01-24 DIAGNOSIS — D631 Anemia in chronic kidney disease: Secondary | ICD-10-CM | POA: Diagnosis not present

## 2019-01-24 DIAGNOSIS — R0602 Shortness of breath: Secondary | ICD-10-CM | POA: Diagnosis not present

## 2019-01-25 DIAGNOSIS — I38 Endocarditis, valve unspecified: Secondary | ICD-10-CM | POA: Diagnosis not present

## 2019-01-25 DIAGNOSIS — Z950 Presence of cardiac pacemaker: Secondary | ICD-10-CM | POA: Diagnosis not present

## 2019-01-25 DIAGNOSIS — Z515 Encounter for palliative care: Secondary | ICD-10-CM | POA: Diagnosis not present

## 2019-01-25 DIAGNOSIS — E872 Acidosis: Secondary | ICD-10-CM | POA: Diagnosis not present

## 2019-01-25 DIAGNOSIS — N184 Chronic kidney disease, stage 4 (severe): Secondary | ICD-10-CM | POA: Diagnosis not present

## 2019-01-25 DIAGNOSIS — D631 Anemia in chronic kidney disease: Secondary | ICD-10-CM | POA: Diagnosis not present

## 2019-01-25 DIAGNOSIS — I442 Atrioventricular block, complete: Secondary | ICD-10-CM | POA: Diagnosis not present

## 2019-01-25 DIAGNOSIS — E8809 Other disorders of plasma-protein metabolism, not elsewhere classified: Secondary | ICD-10-CM | POA: Diagnosis not present

## 2019-01-25 DIAGNOSIS — Z952 Presence of prosthetic heart valve: Secondary | ICD-10-CM | POA: Diagnosis not present

## 2019-01-25 DIAGNOSIS — Z941 Heart transplant status: Secondary | ICD-10-CM | POA: Diagnosis not present

## 2019-01-25 DIAGNOSIS — I428 Other cardiomyopathies: Secondary | ICD-10-CM | POA: Diagnosis not present

## 2019-01-25 DIAGNOSIS — Z79899 Other long term (current) drug therapy: Secondary | ICD-10-CM | POA: Diagnosis not present

## 2019-01-25 DIAGNOSIS — I129 Hypertensive chronic kidney disease with stage 1 through stage 4 chronic kidney disease, or unspecified chronic kidney disease: Secondary | ICD-10-CM | POA: Diagnosis not present

## 2019-01-25 DIAGNOSIS — I358 Other nonrheumatic aortic valve disorders: Secondary | ICD-10-CM | POA: Diagnosis not present

## 2019-01-25 DIAGNOSIS — D649 Anemia, unspecified: Secondary | ICD-10-CM | POA: Diagnosis not present

## 2019-01-25 DIAGNOSIS — Z7189 Other specified counseling: Secondary | ICD-10-CM | POA: Diagnosis not present

## 2019-01-25 DIAGNOSIS — B348 Other viral infections of unspecified site: Secondary | ICD-10-CM | POA: Diagnosis not present

## 2019-01-25 DIAGNOSIS — N179 Acute kidney failure, unspecified: Secondary | ICD-10-CM | POA: Diagnosis not present

## 2019-01-25 DIAGNOSIS — J069 Acute upper respiratory infection, unspecified: Secondary | ICD-10-CM | POA: Diagnosis not present

## 2019-01-26 DIAGNOSIS — I350 Nonrheumatic aortic (valve) stenosis: Secondary | ICD-10-CM | POA: Diagnosis not present

## 2019-01-26 DIAGNOSIS — B348 Other viral infections of unspecified site: Secondary | ICD-10-CM | POA: Diagnosis not present

## 2019-01-26 DIAGNOSIS — D649 Anemia, unspecified: Secondary | ICD-10-CM | POA: Diagnosis not present

## 2019-01-26 DIAGNOSIS — E872 Acidosis: Secondary | ICD-10-CM | POA: Diagnosis not present

## 2019-01-26 DIAGNOSIS — D72829 Elevated white blood cell count, unspecified: Secondary | ICD-10-CM | POA: Diagnosis not present

## 2019-01-26 DIAGNOSIS — B341 Enterovirus infection, unspecified: Secondary | ICD-10-CM | POA: Diagnosis not present

## 2019-01-26 DIAGNOSIS — Z79899 Other long term (current) drug therapy: Secondary | ICD-10-CM | POA: Diagnosis not present

## 2019-01-26 DIAGNOSIS — M79661 Pain in right lower leg: Secondary | ICD-10-CM | POA: Diagnosis not present

## 2019-01-26 DIAGNOSIS — N179 Acute kidney failure, unspecified: Secondary | ICD-10-CM | POA: Diagnosis not present

## 2019-01-26 DIAGNOSIS — I351 Nonrheumatic aortic (valve) insufficiency: Secondary | ICD-10-CM | POA: Diagnosis not present

## 2019-01-26 DIAGNOSIS — N184 Chronic kidney disease, stage 4 (severe): Secondary | ICD-10-CM | POA: Diagnosis not present

## 2019-01-26 DIAGNOSIS — I358 Other nonrheumatic aortic valve disorders: Secondary | ICD-10-CM | POA: Diagnosis not present

## 2019-01-26 DIAGNOSIS — Z941 Heart transplant status: Secondary | ICD-10-CM | POA: Diagnosis not present

## 2019-01-26 DIAGNOSIS — E8809 Other disorders of plasma-protein metabolism, not elsewhere classified: Secondary | ICD-10-CM | POA: Diagnosis not present

## 2019-01-26 DIAGNOSIS — D631 Anemia in chronic kidney disease: Secondary | ICD-10-CM | POA: Diagnosis not present

## 2019-01-26 DIAGNOSIS — Z950 Presence of cardiac pacemaker: Secondary | ICD-10-CM | POA: Diagnosis not present

## 2019-01-26 DIAGNOSIS — T8201XA Breakdown (mechanical) of heart valve prosthesis, initial encounter: Secondary | ICD-10-CM | POA: Diagnosis not present

## 2019-01-26 DIAGNOSIS — I38 Endocarditis, valve unspecified: Secondary | ICD-10-CM | POA: Diagnosis not present

## 2019-01-26 DIAGNOSIS — I129 Hypertensive chronic kidney disease with stage 1 through stage 4 chronic kidney disease, or unspecified chronic kidney disease: Secondary | ICD-10-CM | POA: Diagnosis not present

## 2019-01-27 DIAGNOSIS — Z941 Heart transplant status: Secondary | ICD-10-CM | POA: Diagnosis not present

## 2019-01-27 DIAGNOSIS — E871 Hypo-osmolality and hyponatremia: Secondary | ICD-10-CM | POA: Diagnosis not present

## 2019-01-27 DIAGNOSIS — I351 Nonrheumatic aortic (valve) insufficiency: Secondary | ICD-10-CM | POA: Diagnosis not present

## 2019-01-27 DIAGNOSIS — N184 Chronic kidney disease, stage 4 (severe): Secondary | ICD-10-CM | POA: Diagnosis not present

## 2019-01-27 DIAGNOSIS — Z79899 Other long term (current) drug therapy: Secondary | ICD-10-CM | POA: Diagnosis not present

## 2019-01-27 DIAGNOSIS — B9789 Other viral agents as the cause of diseases classified elsewhere: Secondary | ICD-10-CM | POA: Diagnosis not present

## 2019-01-27 DIAGNOSIS — Z950 Presence of cardiac pacemaker: Secondary | ICD-10-CM | POA: Diagnosis not present

## 2019-01-27 DIAGNOSIS — E8809 Other disorders of plasma-protein metabolism, not elsewhere classified: Secondary | ICD-10-CM | POA: Diagnosis not present

## 2019-01-27 DIAGNOSIS — Z952 Presence of prosthetic heart valve: Secondary | ICD-10-CM | POA: Diagnosis not present

## 2019-01-27 DIAGNOSIS — B341 Enterovirus infection, unspecified: Secondary | ICD-10-CM | POA: Diagnosis not present

## 2019-01-27 DIAGNOSIS — I129 Hypertensive chronic kidney disease with stage 1 through stage 4 chronic kidney disease, or unspecified chronic kidney disease: Secondary | ICD-10-CM | POA: Diagnosis not present

## 2019-01-27 DIAGNOSIS — N179 Acute kidney failure, unspecified: Secondary | ICD-10-CM | POA: Diagnosis not present

## 2019-01-28 DIAGNOSIS — N184 Chronic kidney disease, stage 4 (severe): Secondary | ICD-10-CM | POA: Diagnosis not present

## 2019-01-28 DIAGNOSIS — I129 Hypertensive chronic kidney disease with stage 1 through stage 4 chronic kidney disease, or unspecified chronic kidney disease: Secondary | ICD-10-CM | POA: Diagnosis not present

## 2019-01-28 DIAGNOSIS — Z941 Heart transplant status: Secondary | ICD-10-CM | POA: Diagnosis not present

## 2019-01-28 DIAGNOSIS — Z950 Presence of cardiac pacemaker: Secondary | ICD-10-CM | POA: Diagnosis not present

## 2019-01-28 DIAGNOSIS — N179 Acute kidney failure, unspecified: Secondary | ICD-10-CM | POA: Diagnosis not present

## 2019-01-28 DIAGNOSIS — D631 Anemia in chronic kidney disease: Secondary | ICD-10-CM | POA: Diagnosis not present

## 2019-01-28 DIAGNOSIS — Z01810 Encounter for preprocedural cardiovascular examination: Secondary | ICD-10-CM | POA: Diagnosis not present

## 2019-01-28 DIAGNOSIS — B341 Enterovirus infection, unspecified: Secondary | ICD-10-CM | POA: Diagnosis not present

## 2019-01-28 DIAGNOSIS — I509 Heart failure, unspecified: Secondary | ICD-10-CM | POA: Diagnosis not present

## 2019-01-28 DIAGNOSIS — I351 Nonrheumatic aortic (valve) insufficiency: Secondary | ICD-10-CM | POA: Diagnosis not present

## 2019-01-28 DIAGNOSIS — B9789 Other viral agents as the cause of diseases classified elsewhere: Secondary | ICD-10-CM | POA: Diagnosis not present

## 2019-01-28 DIAGNOSIS — Z79899 Other long term (current) drug therapy: Secondary | ICD-10-CM | POA: Diagnosis not present

## 2019-01-28 DIAGNOSIS — E871 Hypo-osmolality and hyponatremia: Secondary | ICD-10-CM | POA: Diagnosis not present

## 2019-01-28 DIAGNOSIS — Z952 Presence of prosthetic heart valve: Secondary | ICD-10-CM | POA: Diagnosis not present

## 2019-01-28 DIAGNOSIS — E8809 Other disorders of plasma-protein metabolism, not elsewhere classified: Secondary | ICD-10-CM | POA: Diagnosis not present

## 2019-01-29 DIAGNOSIS — Z9911 Dependence on respirator [ventilator] status: Secondary | ICD-10-CM | POA: Diagnosis not present

## 2019-01-29 DIAGNOSIS — I350 Nonrheumatic aortic (valve) stenosis: Secondary | ICD-10-CM | POA: Diagnosis not present

## 2019-01-29 DIAGNOSIS — I420 Dilated cardiomyopathy: Secondary | ICD-10-CM | POA: Diagnosis not present

## 2019-01-29 DIAGNOSIS — Z79899 Other long term (current) drug therapy: Secondary | ICD-10-CM | POA: Diagnosis not present

## 2019-01-29 DIAGNOSIS — R739 Hyperglycemia, unspecified: Secondary | ICD-10-CM | POA: Diagnosis not present

## 2019-01-29 DIAGNOSIS — E861 Hypovolemia: Secondary | ICD-10-CM | POA: Diagnosis not present

## 2019-01-29 DIAGNOSIS — E872 Acidosis: Secondary | ICD-10-CM | POA: Diagnosis not present

## 2019-01-29 DIAGNOSIS — I358 Other nonrheumatic aortic valve disorders: Secondary | ICD-10-CM | POA: Diagnosis not present

## 2019-01-29 DIAGNOSIS — I428 Other cardiomyopathies: Secondary | ICD-10-CM | POA: Diagnosis not present

## 2019-01-29 DIAGNOSIS — D689 Coagulation defect, unspecified: Secondary | ICD-10-CM | POA: Diagnosis not present

## 2019-01-29 DIAGNOSIS — J9601 Acute respiratory failure with hypoxia: Secondary | ICD-10-CM | POA: Diagnosis not present

## 2019-01-29 DIAGNOSIS — G8918 Other acute postprocedural pain: Secondary | ICD-10-CM | POA: Diagnosis not present

## 2019-01-29 DIAGNOSIS — E8809 Other disorders of plasma-protein metabolism, not elsewhere classified: Secondary | ICD-10-CM | POA: Diagnosis not present

## 2019-01-29 DIAGNOSIS — Z950 Presence of cardiac pacemaker: Secondary | ICD-10-CM | POA: Diagnosis not present

## 2019-01-29 DIAGNOSIS — E875 Hyperkalemia: Secondary | ICD-10-CM | POA: Diagnosis not present

## 2019-01-29 DIAGNOSIS — Z941 Heart transplant status: Secondary | ICD-10-CM | POA: Diagnosis not present

## 2019-01-29 DIAGNOSIS — I351 Nonrheumatic aortic (valve) insufficiency: Secondary | ICD-10-CM | POA: Diagnosis not present

## 2019-01-29 DIAGNOSIS — I129 Hypertensive chronic kidney disease with stage 1 through stage 4 chronic kidney disease, or unspecified chronic kidney disease: Secondary | ICD-10-CM | POA: Diagnosis not present

## 2019-01-29 DIAGNOSIS — D649 Anemia, unspecified: Secondary | ICD-10-CM | POA: Diagnosis not present

## 2019-01-29 DIAGNOSIS — Z952 Presence of prosthetic heart valve: Secondary | ICD-10-CM | POA: Diagnosis not present

## 2019-01-29 DIAGNOSIS — N179 Acute kidney failure, unspecified: Secondary | ICD-10-CM | POA: Diagnosis not present

## 2019-01-29 DIAGNOSIS — I959 Hypotension, unspecified: Secondary | ICD-10-CM | POA: Diagnosis not present

## 2019-01-29 DIAGNOSIS — I38 Endocarditis, valve unspecified: Secondary | ICD-10-CM | POA: Diagnosis not present

## 2019-01-29 DIAGNOSIS — B9789 Other viral agents as the cause of diseases classified elsewhere: Secondary | ICD-10-CM | POA: Diagnosis not present

## 2019-01-29 DIAGNOSIS — J9690 Respiratory failure, unspecified, unspecified whether with hypoxia or hypercapnia: Secondary | ICD-10-CM | POA: Diagnosis not present

## 2019-01-29 DIAGNOSIS — I2510 Atherosclerotic heart disease of native coronary artery without angina pectoris: Secondary | ICD-10-CM | POA: Diagnosis not present

## 2019-01-29 DIAGNOSIS — Z954 Presence of other heart-valve replacement: Secondary | ICD-10-CM | POA: Diagnosis not present

## 2019-01-29 DIAGNOSIS — I498 Other specified cardiac arrhythmias: Secondary | ICD-10-CM | POA: Diagnosis not present

## 2019-01-29 DIAGNOSIS — N184 Chronic kidney disease, stage 4 (severe): Secondary | ICD-10-CM | POA: Diagnosis not present

## 2019-01-30 DIAGNOSIS — I129 Hypertensive chronic kidney disease with stage 1 through stage 4 chronic kidney disease, or unspecified chronic kidney disease: Secondary | ICD-10-CM | POA: Diagnosis not present

## 2019-01-30 DIAGNOSIS — J069 Acute upper respiratory infection, unspecified: Secondary | ICD-10-CM | POA: Diagnosis not present

## 2019-01-30 DIAGNOSIS — Z9889 Other specified postprocedural states: Secondary | ICD-10-CM | POA: Diagnosis not present

## 2019-01-30 DIAGNOSIS — I491 Atrial premature depolarization: Secondary | ICD-10-CM | POA: Diagnosis not present

## 2019-01-30 DIAGNOSIS — I351 Nonrheumatic aortic (valve) insufficiency: Secondary | ICD-10-CM | POA: Diagnosis not present

## 2019-01-30 DIAGNOSIS — N179 Acute kidney failure, unspecified: Secondary | ICD-10-CM | POA: Diagnosis not present

## 2019-01-30 DIAGNOSIS — R9431 Abnormal electrocardiogram [ECG] [EKG]: Secondary | ICD-10-CM | POA: Diagnosis not present

## 2019-01-30 DIAGNOSIS — K668 Other specified disorders of peritoneum: Secondary | ICD-10-CM | POA: Diagnosis not present

## 2019-01-30 DIAGNOSIS — I358 Other nonrheumatic aortic valve disorders: Secondary | ICD-10-CM | POA: Diagnosis not present

## 2019-01-30 DIAGNOSIS — R140 Abdominal distension (gaseous): Secondary | ICD-10-CM | POA: Diagnosis not present

## 2019-01-30 DIAGNOSIS — Z941 Heart transplant status: Secondary | ICD-10-CM | POA: Diagnosis not present

## 2019-01-30 DIAGNOSIS — J9811 Atelectasis: Secondary | ICD-10-CM | POA: Diagnosis not present

## 2019-01-30 DIAGNOSIS — E8809 Other disorders of plasma-protein metabolism, not elsewhere classified: Secondary | ICD-10-CM | POA: Diagnosis not present

## 2019-01-30 DIAGNOSIS — D649 Anemia, unspecified: Secondary | ICD-10-CM | POA: Diagnosis not present

## 2019-01-30 DIAGNOSIS — Z9981 Dependence on supplemental oxygen: Secondary | ICD-10-CM | POA: Diagnosis not present

## 2019-01-30 DIAGNOSIS — D62 Acute posthemorrhagic anemia: Secondary | ICD-10-CM | POA: Diagnosis not present

## 2019-01-30 DIAGNOSIS — I119 Hypertensive heart disease without heart failure: Secondary | ICD-10-CM | POA: Diagnosis not present

## 2019-01-30 DIAGNOSIS — Z952 Presence of prosthetic heart valve: Secondary | ICD-10-CM | POA: Diagnosis not present

## 2019-01-30 DIAGNOSIS — N184 Chronic kidney disease, stage 4 (severe): Secondary | ICD-10-CM | POA: Diagnosis not present

## 2019-01-30 DIAGNOSIS — J90 Pleural effusion, not elsewhere classified: Secondary | ICD-10-CM | POA: Diagnosis not present

## 2019-01-30 DIAGNOSIS — R109 Unspecified abdominal pain: Secondary | ICD-10-CM | POA: Diagnosis not present

## 2019-01-30 DIAGNOSIS — E875 Hyperkalemia: Secondary | ICD-10-CM | POA: Diagnosis not present

## 2019-01-30 DIAGNOSIS — I38 Endocarditis, valve unspecified: Secondary | ICD-10-CM | POA: Diagnosis not present

## 2019-01-30 DIAGNOSIS — I455 Other specified heart block: Secondary | ICD-10-CM | POA: Diagnosis not present

## 2019-01-30 DIAGNOSIS — I442 Atrioventricular block, complete: Secondary | ICD-10-CM | POA: Diagnosis not present

## 2019-01-30 DIAGNOSIS — I517 Cardiomegaly: Secondary | ICD-10-CM | POA: Diagnosis not present

## 2019-01-30 DIAGNOSIS — E872 Acidosis: Secondary | ICD-10-CM | POA: Diagnosis not present

## 2019-01-30 DIAGNOSIS — Z950 Presence of cardiac pacemaker: Secondary | ICD-10-CM | POA: Diagnosis not present

## 2019-01-30 DIAGNOSIS — B9719 Other enterovirus as the cause of diseases classified elsewhere: Secondary | ICD-10-CM | POA: Diagnosis not present

## 2019-01-30 DIAGNOSIS — N19 Unspecified kidney failure: Secondary | ICD-10-CM | POA: Diagnosis not present

## 2019-01-31 DIAGNOSIS — N184 Chronic kidney disease, stage 4 (severe): Secondary | ICD-10-CM | POA: Diagnosis not present

## 2019-01-31 DIAGNOSIS — Z950 Presence of cardiac pacemaker: Secondary | ICD-10-CM | POA: Diagnosis not present

## 2019-01-31 DIAGNOSIS — E8809 Other disorders of plasma-protein metabolism, not elsewhere classified: Secondary | ICD-10-CM | POA: Diagnosis not present

## 2019-01-31 DIAGNOSIS — Z954 Presence of other heart-valve replacement: Secondary | ICD-10-CM | POA: Diagnosis not present

## 2019-01-31 DIAGNOSIS — D899 Disorder involving the immune mechanism, unspecified: Secondary | ICD-10-CM | POA: Diagnosis not present

## 2019-01-31 DIAGNOSIS — Z79899 Other long term (current) drug therapy: Secondary | ICD-10-CM | POA: Diagnosis not present

## 2019-01-31 DIAGNOSIS — D649 Anemia, unspecified: Secondary | ICD-10-CM | POA: Diagnosis not present

## 2019-01-31 DIAGNOSIS — I38 Endocarditis, valve unspecified: Secondary | ICD-10-CM | POA: Diagnosis not present

## 2019-01-31 DIAGNOSIS — B341 Enterovirus infection, unspecified: Secondary | ICD-10-CM | POA: Diagnosis not present

## 2019-01-31 DIAGNOSIS — Z952 Presence of prosthetic heart valve: Secondary | ICD-10-CM | POA: Diagnosis not present

## 2019-01-31 DIAGNOSIS — E875 Hyperkalemia: Secondary | ICD-10-CM | POA: Diagnosis not present

## 2019-01-31 DIAGNOSIS — B9789 Other viral agents as the cause of diseases classified elsewhere: Secondary | ICD-10-CM | POA: Diagnosis not present

## 2019-01-31 DIAGNOSIS — I428 Other cardiomyopathies: Secondary | ICD-10-CM | POA: Diagnosis not present

## 2019-01-31 DIAGNOSIS — I129 Hypertensive chronic kidney disease with stage 1 through stage 4 chronic kidney disease, or unspecified chronic kidney disease: Secondary | ICD-10-CM | POA: Diagnosis not present

## 2019-01-31 DIAGNOSIS — I358 Other nonrheumatic aortic valve disorders: Secondary | ICD-10-CM | POA: Diagnosis not present

## 2019-01-31 DIAGNOSIS — I351 Nonrheumatic aortic (valve) insufficiency: Secondary | ICD-10-CM | POA: Diagnosis not present

## 2019-01-31 DIAGNOSIS — I442 Atrioventricular block, complete: Secondary | ICD-10-CM | POA: Diagnosis not present

## 2019-01-31 DIAGNOSIS — R42 Dizziness and giddiness: Secondary | ICD-10-CM | POA: Diagnosis not present

## 2019-01-31 DIAGNOSIS — N19 Unspecified kidney failure: Secondary | ICD-10-CM | POA: Diagnosis not present

## 2019-01-31 DIAGNOSIS — R002 Palpitations: Secondary | ICD-10-CM | POA: Diagnosis not present

## 2019-01-31 DIAGNOSIS — J90 Pleural effusion, not elsewhere classified: Secondary | ICD-10-CM | POA: Diagnosis not present

## 2019-01-31 DIAGNOSIS — I350 Nonrheumatic aortic (valve) stenosis: Secondary | ICD-10-CM | POA: Diagnosis not present

## 2019-01-31 DIAGNOSIS — N179 Acute kidney failure, unspecified: Secondary | ICD-10-CM | POA: Diagnosis not present

## 2019-01-31 DIAGNOSIS — I10 Essential (primary) hypertension: Secondary | ICD-10-CM | POA: Diagnosis not present

## 2019-01-31 DIAGNOSIS — R000 Tachycardia, unspecified: Secondary | ICD-10-CM | POA: Diagnosis not present

## 2019-01-31 DIAGNOSIS — T826XXA Infection and inflammatory reaction due to cardiac valve prosthesis, initial encounter: Secondary | ICD-10-CM | POA: Diagnosis not present

## 2019-01-31 DIAGNOSIS — R109 Unspecified abdominal pain: Secondary | ICD-10-CM | POA: Diagnosis not present

## 2019-01-31 DIAGNOSIS — Z941 Heart transplant status: Secondary | ICD-10-CM | POA: Diagnosis not present

## 2019-01-31 DIAGNOSIS — B348 Other viral infections of unspecified site: Secondary | ICD-10-CM | POA: Diagnosis not present

## 2019-01-31 DIAGNOSIS — E871 Hypo-osmolality and hyponatremia: Secondary | ICD-10-CM | POA: Diagnosis not present

## 2019-02-01 DIAGNOSIS — I358 Other nonrheumatic aortic valve disorders: Secondary | ICD-10-CM | POA: Diagnosis not present

## 2019-02-01 DIAGNOSIS — Z952 Presence of prosthetic heart valve: Secondary | ICD-10-CM | POA: Diagnosis not present

## 2019-02-01 DIAGNOSIS — I10 Essential (primary) hypertension: Secondary | ICD-10-CM | POA: Diagnosis not present

## 2019-02-01 DIAGNOSIS — J069 Acute upper respiratory infection, unspecified: Secondary | ICD-10-CM | POA: Diagnosis not present

## 2019-02-01 DIAGNOSIS — I38 Endocarditis, valve unspecified: Secondary | ICD-10-CM | POA: Diagnosis not present

## 2019-02-01 DIAGNOSIS — E8809 Other disorders of plasma-protein metabolism, not elsewhere classified: Secondary | ICD-10-CM | POA: Diagnosis not present

## 2019-02-01 DIAGNOSIS — N179 Acute kidney failure, unspecified: Secondary | ICD-10-CM | POA: Diagnosis not present

## 2019-02-01 DIAGNOSIS — N184 Chronic kidney disease, stage 4 (severe): Secondary | ICD-10-CM | POA: Diagnosis not present

## 2019-02-01 DIAGNOSIS — Z941 Heart transplant status: Secondary | ICD-10-CM | POA: Diagnosis not present

## 2019-02-01 DIAGNOSIS — R001 Bradycardia, unspecified: Secondary | ICD-10-CM | POA: Diagnosis not present

## 2019-02-01 DIAGNOSIS — I129 Hypertensive chronic kidney disease with stage 1 through stage 4 chronic kidney disease, or unspecified chronic kidney disease: Secondary | ICD-10-CM | POA: Diagnosis not present

## 2019-02-01 DIAGNOSIS — I330 Acute and subacute infective endocarditis: Secondary | ICD-10-CM | POA: Diagnosis not present

## 2019-02-01 DIAGNOSIS — Z79899 Other long term (current) drug therapy: Secondary | ICD-10-CM | POA: Diagnosis not present

## 2019-02-01 DIAGNOSIS — B9789 Other viral agents as the cause of diseases classified elsewhere: Secondary | ICD-10-CM | POA: Diagnosis not present

## 2019-02-01 DIAGNOSIS — I442 Atrioventricular block, complete: Secondary | ICD-10-CM | POA: Diagnosis not present

## 2019-02-01 DIAGNOSIS — D649 Anemia, unspecified: Secondary | ICD-10-CM | POA: Diagnosis not present

## 2019-02-01 DIAGNOSIS — E875 Hyperkalemia: Secondary | ICD-10-CM | POA: Diagnosis not present

## 2019-02-01 DIAGNOSIS — Z950 Presence of cardiac pacemaker: Secondary | ICD-10-CM | POA: Diagnosis not present

## 2019-02-01 DIAGNOSIS — B9719 Other enterovirus as the cause of diseases classified elsewhere: Secondary | ICD-10-CM | POA: Diagnosis not present

## 2019-02-02 DIAGNOSIS — I350 Nonrheumatic aortic (valve) stenosis: Secondary | ICD-10-CM | POA: Diagnosis not present

## 2019-02-02 DIAGNOSIS — I509 Heart failure, unspecified: Secondary | ICD-10-CM | POA: Diagnosis not present

## 2019-02-02 DIAGNOSIS — I4519 Other right bundle-branch block: Secondary | ICD-10-CM | POA: Diagnosis not present

## 2019-02-02 DIAGNOSIS — K30 Functional dyspepsia: Secondary | ICD-10-CM | POA: Diagnosis not present

## 2019-02-02 DIAGNOSIS — N189 Chronic kidney disease, unspecified: Secondary | ICD-10-CM | POA: Diagnosis not present

## 2019-02-02 DIAGNOSIS — R002 Palpitations: Secondary | ICD-10-CM | POA: Diagnosis not present

## 2019-02-02 DIAGNOSIS — Z79899 Other long term (current) drug therapy: Secondary | ICD-10-CM | POA: Diagnosis not present

## 2019-02-02 DIAGNOSIS — I38 Endocarditis, valve unspecified: Secondary | ICD-10-CM | POA: Diagnosis not present

## 2019-02-02 DIAGNOSIS — Z950 Presence of cardiac pacemaker: Secondary | ICD-10-CM | POA: Diagnosis not present

## 2019-02-02 DIAGNOSIS — E878 Other disorders of electrolyte and fluid balance, not elsewhere classified: Secondary | ICD-10-CM | POA: Diagnosis not present

## 2019-02-02 DIAGNOSIS — J90 Pleural effusion, not elsewhere classified: Secondary | ICD-10-CM | POA: Diagnosis not present

## 2019-02-02 DIAGNOSIS — Z006 Encounter for examination for normal comparison and control in clinical research program: Secondary | ICD-10-CM | POA: Diagnosis not present

## 2019-02-02 DIAGNOSIS — I130 Hypertensive heart and chronic kidney disease with heart failure and stage 1 through stage 4 chronic kidney disease, or unspecified chronic kidney disease: Secondary | ICD-10-CM | POA: Diagnosis not present

## 2019-02-02 DIAGNOSIS — R001 Bradycardia, unspecified: Secondary | ICD-10-CM | POA: Diagnosis not present

## 2019-02-02 DIAGNOSIS — I517 Cardiomegaly: Secondary | ICD-10-CM | POA: Diagnosis not present

## 2019-02-02 DIAGNOSIS — R42 Dizziness and giddiness: Secondary | ICD-10-CM | POA: Diagnosis not present

## 2019-02-02 DIAGNOSIS — E8809 Other disorders of plasma-protein metabolism, not elsewhere classified: Secondary | ICD-10-CM | POA: Diagnosis not present

## 2019-02-02 DIAGNOSIS — T826XXA Infection and inflammatory reaction due to cardiac valve prosthesis, initial encounter: Secondary | ICD-10-CM | POA: Diagnosis not present

## 2019-02-02 DIAGNOSIS — E872 Acidosis: Secondary | ICD-10-CM | POA: Diagnosis not present

## 2019-02-02 DIAGNOSIS — Z952 Presence of prosthetic heart valve: Secondary | ICD-10-CM | POA: Diagnosis not present

## 2019-02-02 DIAGNOSIS — Z941 Heart transplant status: Secondary | ICD-10-CM | POA: Diagnosis not present

## 2019-02-02 DIAGNOSIS — N179 Acute kidney failure, unspecified: Secondary | ICD-10-CM | POA: Diagnosis not present

## 2019-02-02 DIAGNOSIS — D649 Anemia, unspecified: Secondary | ICD-10-CM | POA: Diagnosis not present

## 2019-02-02 DIAGNOSIS — I129 Hypertensive chronic kidney disease with stage 1 through stage 4 chronic kidney disease, or unspecified chronic kidney disease: Secondary | ICD-10-CM | POA: Diagnosis not present

## 2019-02-02 DIAGNOSIS — R000 Tachycardia, unspecified: Secondary | ICD-10-CM | POA: Diagnosis not present

## 2019-02-02 DIAGNOSIS — I358 Other nonrheumatic aortic valve disorders: Secondary | ICD-10-CM | POA: Diagnosis not present

## 2019-02-02 DIAGNOSIS — B348 Other viral infections of unspecified site: Secondary | ICD-10-CM | POA: Diagnosis not present

## 2019-02-02 DIAGNOSIS — D899 Disorder involving the immune mechanism, unspecified: Secondary | ICD-10-CM | POA: Diagnosis not present

## 2019-02-02 DIAGNOSIS — B341 Enterovirus infection, unspecified: Secondary | ICD-10-CM | POA: Diagnosis not present

## 2019-02-02 DIAGNOSIS — J810 Acute pulmonary edema: Secondary | ICD-10-CM | POA: Diagnosis not present

## 2019-02-02 DIAGNOSIS — I442 Atrioventricular block, complete: Secondary | ICD-10-CM | POA: Diagnosis not present

## 2019-02-02 DIAGNOSIS — N184 Chronic kidney disease, stage 4 (severe): Secondary | ICD-10-CM | POA: Diagnosis not present

## 2019-02-03 DIAGNOSIS — N184 Chronic kidney disease, stage 4 (severe): Secondary | ICD-10-CM | POA: Diagnosis not present

## 2019-02-03 DIAGNOSIS — E8770 Fluid overload, unspecified: Secondary | ICD-10-CM | POA: Diagnosis not present

## 2019-02-03 DIAGNOSIS — I350 Nonrheumatic aortic (valve) stenosis: Secondary | ICD-10-CM | POA: Diagnosis not present

## 2019-02-03 DIAGNOSIS — I10 Essential (primary) hypertension: Secondary | ICD-10-CM | POA: Diagnosis not present

## 2019-02-03 DIAGNOSIS — I129 Hypertensive chronic kidney disease with stage 1 through stage 4 chronic kidney disease, or unspecified chronic kidney disease: Secondary | ICD-10-CM | POA: Diagnosis not present

## 2019-02-03 DIAGNOSIS — D649 Anemia, unspecified: Secondary | ICD-10-CM | POA: Diagnosis not present

## 2019-02-03 DIAGNOSIS — E872 Acidosis: Secondary | ICD-10-CM | POA: Diagnosis not present

## 2019-02-03 DIAGNOSIS — I358 Other nonrheumatic aortic valve disorders: Secondary | ICD-10-CM | POA: Diagnosis not present

## 2019-02-03 DIAGNOSIS — N179 Acute kidney failure, unspecified: Secondary | ICD-10-CM | POA: Diagnosis not present

## 2019-02-03 DIAGNOSIS — E878 Other disorders of electrolyte and fluid balance, not elsewhere classified: Secondary | ICD-10-CM | POA: Diagnosis not present

## 2019-02-03 DIAGNOSIS — Z952 Presence of prosthetic heart valve: Secondary | ICD-10-CM | POA: Diagnosis not present

## 2019-02-03 DIAGNOSIS — Z941 Heart transplant status: Secondary | ICD-10-CM | POA: Diagnosis not present

## 2019-02-03 DIAGNOSIS — E8809 Other disorders of plasma-protein metabolism, not elsewhere classified: Secondary | ICD-10-CM | POA: Diagnosis not present

## 2019-02-03 DIAGNOSIS — R109 Unspecified abdominal pain: Secondary | ICD-10-CM | POA: Diagnosis not present

## 2019-02-04 DIAGNOSIS — D649 Anemia, unspecified: Secondary | ICD-10-CM | POA: Diagnosis not present

## 2019-02-04 DIAGNOSIS — I351 Nonrheumatic aortic (valve) insufficiency: Secondary | ICD-10-CM | POA: Diagnosis not present

## 2019-02-04 DIAGNOSIS — Z9889 Other specified postprocedural states: Secondary | ICD-10-CM | POA: Diagnosis not present

## 2019-02-04 DIAGNOSIS — Z951 Presence of aortocoronary bypass graft: Secondary | ICD-10-CM | POA: Diagnosis not present

## 2019-02-04 DIAGNOSIS — I350 Nonrheumatic aortic (valve) stenosis: Secondary | ICD-10-CM | POA: Diagnosis not present

## 2019-02-04 DIAGNOSIS — R918 Other nonspecific abnormal finding of lung field: Secondary | ICD-10-CM | POA: Diagnosis not present

## 2019-02-04 DIAGNOSIS — E8809 Other disorders of plasma-protein metabolism, not elsewhere classified: Secondary | ICD-10-CM | POA: Diagnosis not present

## 2019-02-04 DIAGNOSIS — I358 Other nonrheumatic aortic valve disorders: Secondary | ICD-10-CM | POA: Diagnosis not present

## 2019-02-04 DIAGNOSIS — D696 Thrombocytopenia, unspecified: Secondary | ICD-10-CM | POA: Diagnosis not present

## 2019-02-04 DIAGNOSIS — R109 Unspecified abdominal pain: Secondary | ICD-10-CM | POA: Diagnosis not present

## 2019-02-04 DIAGNOSIS — J9811 Atelectasis: Secondary | ICD-10-CM | POA: Diagnosis not present

## 2019-02-04 DIAGNOSIS — G8918 Other acute postprocedural pain: Secondary | ICD-10-CM | POA: Diagnosis not present

## 2019-02-04 DIAGNOSIS — N184 Chronic kidney disease, stage 4 (severe): Secondary | ICD-10-CM | POA: Diagnosis not present

## 2019-02-04 DIAGNOSIS — N179 Acute kidney failure, unspecified: Secondary | ICD-10-CM | POA: Diagnosis not present

## 2019-02-04 DIAGNOSIS — J90 Pleural effusion, not elsewhere classified: Secondary | ICD-10-CM | POA: Diagnosis not present

## 2019-02-04 DIAGNOSIS — E872 Acidosis: Secondary | ICD-10-CM | POA: Diagnosis not present

## 2019-02-04 DIAGNOSIS — I129 Hypertensive chronic kidney disease with stage 1 through stage 4 chronic kidney disease, or unspecified chronic kidney disease: Secondary | ICD-10-CM | POA: Diagnosis not present

## 2019-02-04 DIAGNOSIS — Z941 Heart transplant status: Secondary | ICD-10-CM | POA: Diagnosis not present

## 2019-02-04 DIAGNOSIS — Z952 Presence of prosthetic heart valve: Secondary | ICD-10-CM | POA: Diagnosis not present

## 2019-02-04 DIAGNOSIS — B348 Other viral infections of unspecified site: Secondary | ICD-10-CM | POA: Diagnosis not present

## 2019-02-04 DIAGNOSIS — D62 Acute posthemorrhagic anemia: Secondary | ICD-10-CM | POA: Diagnosis not present

## 2019-02-04 DIAGNOSIS — E8770 Fluid overload, unspecified: Secondary | ICD-10-CM | POA: Diagnosis not present

## 2019-02-04 DIAGNOSIS — Z79899 Other long term (current) drug therapy: Secondary | ICD-10-CM | POA: Diagnosis not present

## 2019-02-04 DIAGNOSIS — J811 Chronic pulmonary edema: Secondary | ICD-10-CM | POA: Diagnosis not present

## 2019-02-04 DIAGNOSIS — I2510 Atherosclerotic heart disease of native coronary artery without angina pectoris: Secondary | ICD-10-CM | POA: Diagnosis not present

## 2019-02-05 DIAGNOSIS — I2510 Atherosclerotic heart disease of native coronary artery without angina pectoris: Secondary | ICD-10-CM | POA: Diagnosis not present

## 2019-02-05 DIAGNOSIS — D649 Anemia, unspecified: Secondary | ICD-10-CM | POA: Diagnosis not present

## 2019-02-05 DIAGNOSIS — E872 Acidosis: Secondary | ICD-10-CM | POA: Diagnosis not present

## 2019-02-05 DIAGNOSIS — Z951 Presence of aortocoronary bypass graft: Secondary | ICD-10-CM | POA: Diagnosis not present

## 2019-02-05 DIAGNOSIS — E8809 Other disorders of plasma-protein metabolism, not elsewhere classified: Secondary | ICD-10-CM | POA: Diagnosis not present

## 2019-02-05 DIAGNOSIS — Z7982 Long term (current) use of aspirin: Secondary | ICD-10-CM | POA: Diagnosis not present

## 2019-02-05 DIAGNOSIS — Z941 Heart transplant status: Secondary | ICD-10-CM | POA: Diagnosis not present

## 2019-02-05 DIAGNOSIS — I129 Hypertensive chronic kidney disease with stage 1 through stage 4 chronic kidney disease, or unspecified chronic kidney disease: Secondary | ICD-10-CM | POA: Diagnosis not present

## 2019-02-05 DIAGNOSIS — D62 Acute posthemorrhagic anemia: Secondary | ICD-10-CM | POA: Diagnosis not present

## 2019-02-05 DIAGNOSIS — Z79899 Other long term (current) drug therapy: Secondary | ICD-10-CM | POA: Diagnosis not present

## 2019-02-05 DIAGNOSIS — N184 Chronic kidney disease, stage 4 (severe): Secondary | ICD-10-CM | POA: Diagnosis not present

## 2019-02-05 DIAGNOSIS — Z952 Presence of prosthetic heart valve: Secondary | ICD-10-CM | POA: Diagnosis not present

## 2019-02-05 DIAGNOSIS — G8918 Other acute postprocedural pain: Secondary | ICD-10-CM | POA: Diagnosis not present

## 2019-02-05 DIAGNOSIS — D696 Thrombocytopenia, unspecified: Secondary | ICD-10-CM | POA: Diagnosis not present

## 2019-02-05 DIAGNOSIS — Z950 Presence of cardiac pacemaker: Secondary | ICD-10-CM | POA: Diagnosis not present

## 2019-02-05 DIAGNOSIS — N990 Postprocedural (acute) (chronic) kidney failure: Secondary | ICD-10-CM | POA: Diagnosis not present

## 2019-02-05 DIAGNOSIS — I442 Atrioventricular block, complete: Secondary | ICD-10-CM | POA: Diagnosis not present

## 2019-02-05 DIAGNOSIS — I38 Endocarditis, valve unspecified: Secondary | ICD-10-CM | POA: Diagnosis not present

## 2019-02-05 DIAGNOSIS — I350 Nonrheumatic aortic (valve) stenosis: Secondary | ICD-10-CM | POA: Diagnosis not present

## 2019-02-05 DIAGNOSIS — Z4821 Encounter for aftercare following heart transplant: Secondary | ICD-10-CM | POA: Diagnosis not present

## 2019-02-05 DIAGNOSIS — K5900 Constipation, unspecified: Secondary | ICD-10-CM | POA: Diagnosis not present

## 2019-02-05 DIAGNOSIS — N179 Acute kidney failure, unspecified: Secondary | ICD-10-CM | POA: Diagnosis not present

## 2019-02-05 DIAGNOSIS — I358 Other nonrheumatic aortic valve disorders: Secondary | ICD-10-CM | POA: Diagnosis not present

## 2019-02-05 DIAGNOSIS — I428 Other cardiomyopathies: Secondary | ICD-10-CM | POA: Diagnosis not present

## 2019-02-06 DIAGNOSIS — Z950 Presence of cardiac pacemaker: Secondary | ICD-10-CM | POA: Diagnosis not present

## 2019-02-06 DIAGNOSIS — Z4821 Encounter for aftercare following heart transplant: Secondary | ICD-10-CM | POA: Diagnosis not present

## 2019-02-06 DIAGNOSIS — N184 Chronic kidney disease, stage 4 (severe): Secondary | ICD-10-CM | POA: Diagnosis not present

## 2019-02-06 DIAGNOSIS — I350 Nonrheumatic aortic (valve) stenosis: Secondary | ICD-10-CM | POA: Diagnosis not present

## 2019-02-06 DIAGNOSIS — I442 Atrioventricular block, complete: Secondary | ICD-10-CM | POA: Diagnosis not present

## 2019-02-06 DIAGNOSIS — E8770 Fluid overload, unspecified: Secondary | ICD-10-CM | POA: Diagnosis not present

## 2019-02-06 DIAGNOSIS — Z952 Presence of prosthetic heart valve: Secondary | ICD-10-CM | POA: Diagnosis not present

## 2019-02-06 DIAGNOSIS — N179 Acute kidney failure, unspecified: Secondary | ICD-10-CM | POA: Diagnosis not present

## 2019-02-06 DIAGNOSIS — Z941 Heart transplant status: Secondary | ICD-10-CM | POA: Diagnosis not present

## 2019-02-06 DIAGNOSIS — E872 Acidosis: Secondary | ICD-10-CM | POA: Diagnosis not present

## 2019-02-06 DIAGNOSIS — D649 Anemia, unspecified: Secondary | ICD-10-CM | POA: Diagnosis not present

## 2019-02-06 DIAGNOSIS — Z7982 Long term (current) use of aspirin: Secondary | ICD-10-CM | POA: Diagnosis not present

## 2019-02-06 DIAGNOSIS — Z789 Other specified health status: Secondary | ICD-10-CM | POA: Diagnosis not present

## 2019-02-06 DIAGNOSIS — I129 Hypertensive chronic kidney disease with stage 1 through stage 4 chronic kidney disease, or unspecified chronic kidney disease: Secondary | ICD-10-CM | POA: Diagnosis not present

## 2019-02-06 DIAGNOSIS — E8809 Other disorders of plasma-protein metabolism, not elsewhere classified: Secondary | ICD-10-CM | POA: Diagnosis not present

## 2019-02-06 DIAGNOSIS — N990 Postprocedural (acute) (chronic) kidney failure: Secondary | ICD-10-CM | POA: Diagnosis not present

## 2019-02-06 DIAGNOSIS — I358 Other nonrheumatic aortic valve disorders: Secondary | ICD-10-CM | POA: Diagnosis not present

## 2019-02-07 DIAGNOSIS — I129 Hypertensive chronic kidney disease with stage 1 through stage 4 chronic kidney disease, or unspecified chronic kidney disease: Secondary | ICD-10-CM | POA: Diagnosis not present

## 2019-02-07 DIAGNOSIS — Z952 Presence of prosthetic heart valve: Secondary | ICD-10-CM | POA: Diagnosis not present

## 2019-02-07 DIAGNOSIS — E8770 Fluid overload, unspecified: Secondary | ICD-10-CM | POA: Diagnosis not present

## 2019-02-07 DIAGNOSIS — N184 Chronic kidney disease, stage 4 (severe): Secondary | ICD-10-CM | POA: Diagnosis not present

## 2019-02-07 DIAGNOSIS — I442 Atrioventricular block, complete: Secondary | ICD-10-CM | POA: Diagnosis not present

## 2019-02-07 DIAGNOSIS — R600 Localized edema: Secondary | ICD-10-CM | POA: Diagnosis not present

## 2019-02-07 DIAGNOSIS — N179 Acute kidney failure, unspecified: Secondary | ICD-10-CM | POA: Diagnosis not present

## 2019-02-07 DIAGNOSIS — I350 Nonrheumatic aortic (valve) stenosis: Secondary | ICD-10-CM | POA: Diagnosis not present

## 2019-02-07 DIAGNOSIS — Z941 Heart transplant status: Secondary | ICD-10-CM | POA: Diagnosis not present

## 2019-02-07 DIAGNOSIS — N990 Postprocedural (acute) (chronic) kidney failure: Secondary | ICD-10-CM | POA: Diagnosis not present

## 2019-02-07 DIAGNOSIS — D649 Anemia, unspecified: Secondary | ICD-10-CM | POA: Diagnosis not present

## 2019-02-07 DIAGNOSIS — Z950 Presence of cardiac pacemaker: Secondary | ICD-10-CM | POA: Diagnosis not present

## 2019-02-07 DIAGNOSIS — I358 Other nonrheumatic aortic valve disorders: Secondary | ICD-10-CM | POA: Diagnosis not present

## 2019-02-07 DIAGNOSIS — B349 Viral infection, unspecified: Secondary | ICD-10-CM | POA: Diagnosis not present

## 2019-02-07 DIAGNOSIS — Z4821 Encounter for aftercare following heart transplant: Secondary | ICD-10-CM | POA: Diagnosis not present

## 2019-02-07 DIAGNOSIS — E8809 Other disorders of plasma-protein metabolism, not elsewhere classified: Secondary | ICD-10-CM | POA: Diagnosis not present

## 2019-02-08 DIAGNOSIS — N990 Postprocedural (acute) (chronic) kidney failure: Secondary | ICD-10-CM | POA: Diagnosis not present

## 2019-02-08 DIAGNOSIS — I358 Other nonrheumatic aortic valve disorders: Secondary | ICD-10-CM | POA: Diagnosis not present

## 2019-02-08 DIAGNOSIS — B349 Viral infection, unspecified: Secondary | ICD-10-CM | POA: Diagnosis not present

## 2019-02-08 DIAGNOSIS — N184 Chronic kidney disease, stage 4 (severe): Secondary | ICD-10-CM | POA: Diagnosis not present

## 2019-02-08 DIAGNOSIS — I350 Nonrheumatic aortic (valve) stenosis: Secondary | ICD-10-CM | POA: Diagnosis not present

## 2019-02-08 DIAGNOSIS — Z4821 Encounter for aftercare following heart transplant: Secondary | ICD-10-CM | POA: Diagnosis not present

## 2019-02-08 DIAGNOSIS — I129 Hypertensive chronic kidney disease with stage 1 through stage 4 chronic kidney disease, or unspecified chronic kidney disease: Secondary | ICD-10-CM | POA: Diagnosis not present

## 2019-02-08 DIAGNOSIS — I442 Atrioventricular block, complete: Secondary | ICD-10-CM | POA: Diagnosis not present

## 2019-02-08 DIAGNOSIS — N179 Acute kidney failure, unspecified: Secondary | ICD-10-CM | POA: Diagnosis not present

## 2019-02-08 DIAGNOSIS — Z952 Presence of prosthetic heart valve: Secondary | ICD-10-CM | POA: Diagnosis not present

## 2019-02-08 DIAGNOSIS — Z950 Presence of cardiac pacemaker: Secondary | ICD-10-CM | POA: Diagnosis not present

## 2019-02-09 DIAGNOSIS — B259 Cytomegaloviral disease, unspecified: Secondary | ICD-10-CM | POA: Diagnosis not present

## 2019-02-09 DIAGNOSIS — Z992 Dependence on renal dialysis: Secondary | ICD-10-CM | POA: Diagnosis not present

## 2019-02-09 DIAGNOSIS — D638 Anemia in other chronic diseases classified elsewhere: Secondary | ICD-10-CM | POA: Diagnosis not present

## 2019-02-09 DIAGNOSIS — N184 Chronic kidney disease, stage 4 (severe): Secondary | ICD-10-CM | POA: Diagnosis not present

## 2019-02-09 DIAGNOSIS — N186 End stage renal disease: Secondary | ICD-10-CM | POA: Diagnosis not present

## 2019-02-09 DIAGNOSIS — Z952 Presence of prosthetic heart valve: Secondary | ICD-10-CM | POA: Diagnosis not present

## 2019-02-09 DIAGNOSIS — Z941 Heart transplant status: Secondary | ICD-10-CM | POA: Diagnosis not present

## 2019-02-09 DIAGNOSIS — E8809 Other disorders of plasma-protein metabolism, not elsewhere classified: Secondary | ICD-10-CM | POA: Diagnosis not present

## 2019-02-09 DIAGNOSIS — N179 Acute kidney failure, unspecified: Secondary | ICD-10-CM | POA: Diagnosis not present

## 2019-02-09 DIAGNOSIS — I129 Hypertensive chronic kidney disease with stage 1 through stage 4 chronic kidney disease, or unspecified chronic kidney disease: Secondary | ICD-10-CM | POA: Diagnosis not present

## 2019-02-09 DIAGNOSIS — I358 Other nonrheumatic aortic valve disorders: Secondary | ICD-10-CM | POA: Diagnosis not present

## 2019-02-09 DIAGNOSIS — E871 Hypo-osmolality and hyponatremia: Secondary | ICD-10-CM | POA: Diagnosis not present

## 2019-02-09 DIAGNOSIS — R1110 Vomiting, unspecified: Secondary | ICD-10-CM | POA: Diagnosis not present

## 2019-02-09 DIAGNOSIS — R079 Chest pain, unspecified: Secondary | ICD-10-CM | POA: Diagnosis not present

## 2019-02-09 DIAGNOSIS — D649 Anemia, unspecified: Secondary | ICD-10-CM | POA: Diagnosis not present

## 2019-02-09 DIAGNOSIS — R140 Abdominal distension (gaseous): Secondary | ICD-10-CM | POA: Diagnosis not present

## 2019-02-09 DIAGNOSIS — E8770 Fluid overload, unspecified: Secondary | ICD-10-CM | POA: Diagnosis not present

## 2019-02-09 DIAGNOSIS — D62 Acute posthemorrhagic anemia: Secondary | ICD-10-CM | POA: Diagnosis not present

## 2019-02-09 DIAGNOSIS — Z7982 Long term (current) use of aspirin: Secondary | ICD-10-CM | POA: Diagnosis not present

## 2019-02-10 DIAGNOSIS — E8809 Other disorders of plasma-protein metabolism, not elsewhere classified: Secondary | ICD-10-CM | POA: Diagnosis not present

## 2019-02-10 DIAGNOSIS — D62 Acute posthemorrhagic anemia: Secondary | ICD-10-CM | POA: Diagnosis not present

## 2019-02-10 DIAGNOSIS — E8770 Fluid overload, unspecified: Secondary | ICD-10-CM | POA: Diagnosis not present

## 2019-02-10 DIAGNOSIS — I358 Other nonrheumatic aortic valve disorders: Secondary | ICD-10-CM | POA: Diagnosis not present

## 2019-02-10 DIAGNOSIS — I517 Cardiomegaly: Secondary | ICD-10-CM | POA: Diagnosis not present

## 2019-02-10 DIAGNOSIS — R109 Unspecified abdominal pain: Secondary | ICD-10-CM | POA: Diagnosis not present

## 2019-02-10 DIAGNOSIS — N184 Chronic kidney disease, stage 4 (severe): Secondary | ICD-10-CM | POA: Diagnosis not present

## 2019-02-10 DIAGNOSIS — I444 Left anterior fascicular block: Secondary | ICD-10-CM | POA: Diagnosis not present

## 2019-02-10 DIAGNOSIS — D649 Anemia, unspecified: Secondary | ICD-10-CM | POA: Diagnosis not present

## 2019-02-10 DIAGNOSIS — I129 Hypertensive chronic kidney disease with stage 1 through stage 4 chronic kidney disease, or unspecified chronic kidney disease: Secondary | ICD-10-CM | POA: Diagnosis not present

## 2019-02-10 DIAGNOSIS — R140 Abdominal distension (gaseous): Secondary | ICD-10-CM | POA: Diagnosis not present

## 2019-02-10 DIAGNOSIS — Z952 Presence of prosthetic heart valve: Secondary | ICD-10-CM | POA: Diagnosis not present

## 2019-02-10 DIAGNOSIS — D638 Anemia in other chronic diseases classified elsewhere: Secondary | ICD-10-CM | POA: Diagnosis not present

## 2019-02-10 DIAGNOSIS — R000 Tachycardia, unspecified: Secondary | ICD-10-CM | POA: Diagnosis not present

## 2019-02-10 DIAGNOSIS — R935 Abnormal findings on diagnostic imaging of other abdominal regions, including retroperitoneum: Secondary | ICD-10-CM | POA: Diagnosis not present

## 2019-02-10 DIAGNOSIS — I452 Bifascicular block: Secondary | ICD-10-CM | POA: Diagnosis not present

## 2019-02-10 DIAGNOSIS — Z7982 Long term (current) use of aspirin: Secondary | ICD-10-CM | POA: Diagnosis not present

## 2019-02-10 DIAGNOSIS — N186 End stage renal disease: Secondary | ICD-10-CM | POA: Diagnosis not present

## 2019-02-10 DIAGNOSIS — I4519 Other right bundle-branch block: Secondary | ICD-10-CM | POA: Diagnosis not present

## 2019-02-10 DIAGNOSIS — N179 Acute kidney failure, unspecified: Secondary | ICD-10-CM | POA: Diagnosis not present

## 2019-02-10 DIAGNOSIS — B259 Cytomegaloviral disease, unspecified: Secondary | ICD-10-CM | POA: Diagnosis not present

## 2019-02-10 DIAGNOSIS — Z992 Dependence on renal dialysis: Secondary | ICD-10-CM | POA: Diagnosis not present

## 2019-02-10 DIAGNOSIS — R1110 Vomiting, unspecified: Secondary | ICD-10-CM | POA: Diagnosis not present

## 2019-02-10 DIAGNOSIS — Z941 Heart transplant status: Secondary | ICD-10-CM | POA: Diagnosis not present

## 2019-02-11 DIAGNOSIS — D62 Acute posthemorrhagic anemia: Secondary | ICD-10-CM | POA: Diagnosis not present

## 2019-02-11 DIAGNOSIS — N186 End stage renal disease: Secondary | ICD-10-CM | POA: Diagnosis not present

## 2019-02-11 DIAGNOSIS — Z7982 Long term (current) use of aspirin: Secondary | ICD-10-CM | POA: Diagnosis not present

## 2019-02-11 DIAGNOSIS — B259 Cytomegaloviral disease, unspecified: Secondary | ICD-10-CM | POA: Diagnosis not present

## 2019-02-11 DIAGNOSIS — I129 Hypertensive chronic kidney disease with stage 1 through stage 4 chronic kidney disease, or unspecified chronic kidney disease: Secondary | ICD-10-CM | POA: Diagnosis not present

## 2019-02-11 DIAGNOSIS — E8770 Fluid overload, unspecified: Secondary | ICD-10-CM | POA: Diagnosis not present

## 2019-02-11 DIAGNOSIS — D735 Infarction of spleen: Secondary | ICD-10-CM | POA: Diagnosis not present

## 2019-02-11 DIAGNOSIS — D649 Anemia, unspecified: Secondary | ICD-10-CM | POA: Diagnosis not present

## 2019-02-11 DIAGNOSIS — D638 Anemia in other chronic diseases classified elsewhere: Secondary | ICD-10-CM | POA: Diagnosis not present

## 2019-02-11 DIAGNOSIS — Z941 Heart transplant status: Secondary | ICD-10-CM | POA: Diagnosis not present

## 2019-02-11 DIAGNOSIS — I358 Other nonrheumatic aortic valve disorders: Secondary | ICD-10-CM | POA: Diagnosis not present

## 2019-02-11 DIAGNOSIS — N179 Acute kidney failure, unspecified: Secondary | ICD-10-CM | POA: Diagnosis not present

## 2019-02-11 DIAGNOSIS — R1110 Vomiting, unspecified: Secondary | ICD-10-CM | POA: Diagnosis not present

## 2019-02-11 DIAGNOSIS — N184 Chronic kidney disease, stage 4 (severe): Secondary | ICD-10-CM | POA: Diagnosis not present

## 2019-02-11 DIAGNOSIS — Z952 Presence of prosthetic heart valve: Secondary | ICD-10-CM | POA: Diagnosis not present

## 2019-02-11 DIAGNOSIS — E8809 Other disorders of plasma-protein metabolism, not elsewhere classified: Secondary | ICD-10-CM | POA: Diagnosis not present

## 2019-02-11 DIAGNOSIS — Z992 Dependence on renal dialysis: Secondary | ICD-10-CM | POA: Diagnosis not present

## 2019-02-12 DIAGNOSIS — N184 Chronic kidney disease, stage 4 (severe): Secondary | ICD-10-CM | POA: Diagnosis not present

## 2019-02-12 DIAGNOSIS — B259 Cytomegaloviral disease, unspecified: Secondary | ICD-10-CM | POA: Diagnosis not present

## 2019-02-12 DIAGNOSIS — I358 Other nonrheumatic aortic valve disorders: Secondary | ICD-10-CM | POA: Diagnosis not present

## 2019-02-12 DIAGNOSIS — N186 End stage renal disease: Secondary | ICD-10-CM | POA: Diagnosis not present

## 2019-02-12 DIAGNOSIS — N179 Acute kidney failure, unspecified: Secondary | ICD-10-CM | POA: Diagnosis not present

## 2019-02-12 DIAGNOSIS — E8809 Other disorders of plasma-protein metabolism, not elsewhere classified: Secondary | ICD-10-CM | POA: Diagnosis not present

## 2019-02-12 DIAGNOSIS — D62 Acute posthemorrhagic anemia: Secondary | ICD-10-CM | POA: Diagnosis not present

## 2019-02-12 DIAGNOSIS — I129 Hypertensive chronic kidney disease with stage 1 through stage 4 chronic kidney disease, or unspecified chronic kidney disease: Secondary | ICD-10-CM | POA: Diagnosis not present

## 2019-02-12 DIAGNOSIS — T50995A Adverse effect of other drugs, medicaments and biological substances, initial encounter: Secondary | ICD-10-CM | POA: Diagnosis not present

## 2019-02-12 DIAGNOSIS — Z941 Heart transplant status: Secondary | ICD-10-CM | POA: Diagnosis not present

## 2019-02-12 DIAGNOSIS — I428 Other cardiomyopathies: Secondary | ICD-10-CM | POA: Diagnosis not present

## 2019-02-12 DIAGNOSIS — D638 Anemia in other chronic diseases classified elsewhere: Secondary | ICD-10-CM | POA: Diagnosis not present

## 2019-02-12 DIAGNOSIS — I339 Acute and subacute endocarditis, unspecified: Secondary | ICD-10-CM | POA: Diagnosis not present

## 2019-02-12 DIAGNOSIS — Z950 Presence of cardiac pacemaker: Secondary | ICD-10-CM | POA: Diagnosis not present

## 2019-02-12 DIAGNOSIS — Z992 Dependence on renal dialysis: Secondary | ICD-10-CM | POA: Diagnosis not present

## 2019-02-12 DIAGNOSIS — Z7982 Long term (current) use of aspirin: Secondary | ICD-10-CM | POA: Diagnosis not present

## 2019-02-12 DIAGNOSIS — M62549 Muscle wasting and atrophy, not elsewhere classified, unspecified hand: Secondary | ICD-10-CM | POA: Diagnosis not present

## 2019-02-12 DIAGNOSIS — Z952 Presence of prosthetic heart valve: Secondary | ICD-10-CM | POA: Diagnosis not present

## 2019-02-12 DIAGNOSIS — E8770 Fluid overload, unspecified: Secondary | ICD-10-CM | POA: Diagnosis not present

## 2019-02-12 DIAGNOSIS — D649 Anemia, unspecified: Secondary | ICD-10-CM | POA: Diagnosis not present

## 2019-02-13 DIAGNOSIS — I358 Other nonrheumatic aortic valve disorders: Secondary | ICD-10-CM | POA: Diagnosis not present

## 2019-02-13 DIAGNOSIS — D638 Anemia in other chronic diseases classified elsewhere: Secondary | ICD-10-CM | POA: Diagnosis not present

## 2019-02-13 DIAGNOSIS — Z941 Heart transplant status: Secondary | ICD-10-CM | POA: Diagnosis not present

## 2019-02-13 DIAGNOSIS — I129 Hypertensive chronic kidney disease with stage 1 through stage 4 chronic kidney disease, or unspecified chronic kidney disease: Secondary | ICD-10-CM | POA: Diagnosis not present

## 2019-02-13 DIAGNOSIS — I517 Cardiomegaly: Secondary | ICD-10-CM | POA: Diagnosis not present

## 2019-02-13 DIAGNOSIS — M62549 Muscle wasting and atrophy, not elsewhere classified, unspecified hand: Secondary | ICD-10-CM | POA: Diagnosis not present

## 2019-02-13 DIAGNOSIS — N184 Chronic kidney disease, stage 4 (severe): Secondary | ICD-10-CM | POA: Diagnosis not present

## 2019-02-13 DIAGNOSIS — I509 Heart failure, unspecified: Secondary | ICD-10-CM | POA: Diagnosis not present

## 2019-02-13 DIAGNOSIS — E8809 Other disorders of plasma-protein metabolism, not elsewhere classified: Secondary | ICD-10-CM | POA: Diagnosis not present

## 2019-02-13 DIAGNOSIS — D62 Acute posthemorrhagic anemia: Secondary | ICD-10-CM | POA: Diagnosis not present

## 2019-02-13 DIAGNOSIS — I4519 Other right bundle-branch block: Secondary | ICD-10-CM | POA: Diagnosis not present

## 2019-02-13 DIAGNOSIS — Z7982 Long term (current) use of aspirin: Secondary | ICD-10-CM | POA: Diagnosis not present

## 2019-02-13 DIAGNOSIS — N179 Acute kidney failure, unspecified: Secondary | ICD-10-CM | POA: Diagnosis not present

## 2019-02-13 DIAGNOSIS — Z952 Presence of prosthetic heart valve: Secondary | ICD-10-CM | POA: Diagnosis not present

## 2019-02-13 DIAGNOSIS — B259 Cytomegaloviral disease, unspecified: Secondary | ICD-10-CM | POA: Diagnosis not present

## 2019-02-14 DIAGNOSIS — Z992 Dependence on renal dialysis: Secondary | ICD-10-CM | POA: Diagnosis not present

## 2019-02-14 DIAGNOSIS — D509 Iron deficiency anemia, unspecified: Secondary | ICD-10-CM | POA: Diagnosis not present

## 2019-02-14 DIAGNOSIS — N186 End stage renal disease: Secondary | ICD-10-CM | POA: Diagnosis not present

## 2019-02-14 DIAGNOSIS — Z23 Encounter for immunization: Secondary | ICD-10-CM | POA: Diagnosis not present

## 2019-02-14 DIAGNOSIS — D631 Anemia in chronic kidney disease: Secondary | ICD-10-CM | POA: Diagnosis not present

## 2019-02-14 DIAGNOSIS — I259 Chronic ischemic heart disease, unspecified: Secondary | ICD-10-CM | POA: Diagnosis not present

## 2019-02-14 DIAGNOSIS — Z79899 Other long term (current) drug therapy: Secondary | ICD-10-CM | POA: Diagnosis not present

## 2019-02-14 DIAGNOSIS — I38 Endocarditis, valve unspecified: Secondary | ICD-10-CM | POA: Diagnosis not present

## 2019-02-14 DIAGNOSIS — N2581 Secondary hyperparathyroidism of renal origin: Secondary | ICD-10-CM | POA: Diagnosis not present

## 2019-02-15 DIAGNOSIS — N184 Chronic kidney disease, stage 4 (severe): Secondary | ICD-10-CM | POA: Diagnosis not present

## 2019-02-15 DIAGNOSIS — Z941 Heart transplant status: Secondary | ICD-10-CM | POA: Diagnosis not present

## 2019-02-15 DIAGNOSIS — D631 Anemia in chronic kidney disease: Secondary | ICD-10-CM | POA: Diagnosis not present

## 2019-02-15 DIAGNOSIS — Z952 Presence of prosthetic heart valve: Secondary | ICD-10-CM | POA: Diagnosis not present

## 2019-02-15 DIAGNOSIS — Z950 Presence of cardiac pacemaker: Secondary | ICD-10-CM | POA: Diagnosis not present

## 2019-02-15 DIAGNOSIS — Z48812 Encounter for surgical aftercare following surgery on the circulatory system: Secondary | ICD-10-CM | POA: Diagnosis not present

## 2019-02-15 DIAGNOSIS — I429 Cardiomyopathy, unspecified: Secondary | ICD-10-CM | POA: Diagnosis not present

## 2019-02-15 DIAGNOSIS — I130 Hypertensive heart and chronic kidney disease with heart failure and stage 1 through stage 4 chronic kidney disease, or unspecified chronic kidney disease: Secondary | ICD-10-CM | POA: Diagnosis not present

## 2019-02-15 DIAGNOSIS — Z85048 Personal history of other malignant neoplasm of rectum, rectosigmoid junction, and anus: Secondary | ICD-10-CM | POA: Diagnosis not present

## 2019-02-15 DIAGNOSIS — Z87891 Personal history of nicotine dependence: Secondary | ICD-10-CM | POA: Diagnosis not present

## 2019-02-15 DIAGNOSIS — I509 Heart failure, unspecified: Secondary | ICD-10-CM | POA: Diagnosis not present

## 2019-02-15 DIAGNOSIS — I25811 Atherosclerosis of native coronary artery of transplanted heart without angina pectoris: Secondary | ICD-10-CM | POA: Diagnosis not present

## 2019-02-16 DIAGNOSIS — D631 Anemia in chronic kidney disease: Secondary | ICD-10-CM | POA: Diagnosis not present

## 2019-02-16 DIAGNOSIS — N186 End stage renal disease: Secondary | ICD-10-CM | POA: Diagnosis not present

## 2019-02-16 DIAGNOSIS — Z992 Dependence on renal dialysis: Secondary | ICD-10-CM | POA: Diagnosis not present

## 2019-02-16 DIAGNOSIS — I38 Endocarditis, valve unspecified: Secondary | ICD-10-CM | POA: Diagnosis not present

## 2019-02-16 DIAGNOSIS — Z23 Encounter for immunization: Secondary | ICD-10-CM | POA: Diagnosis not present

## 2019-02-16 DIAGNOSIS — D509 Iron deficiency anemia, unspecified: Secondary | ICD-10-CM | POA: Diagnosis not present

## 2019-02-19 DIAGNOSIS — N186 End stage renal disease: Secondary | ICD-10-CM | POA: Diagnosis not present

## 2019-02-19 DIAGNOSIS — Z992 Dependence on renal dialysis: Secondary | ICD-10-CM | POA: Diagnosis not present

## 2019-02-19 DIAGNOSIS — Z23 Encounter for immunization: Secondary | ICD-10-CM | POA: Diagnosis not present

## 2019-02-19 DIAGNOSIS — D631 Anemia in chronic kidney disease: Secondary | ICD-10-CM | POA: Diagnosis not present

## 2019-02-19 DIAGNOSIS — D509 Iron deficiency anemia, unspecified: Secondary | ICD-10-CM | POA: Diagnosis not present

## 2019-02-19 DIAGNOSIS — I38 Endocarditis, valve unspecified: Secondary | ICD-10-CM | POA: Diagnosis not present

## 2019-02-20 DIAGNOSIS — D631 Anemia in chronic kidney disease: Secondary | ICD-10-CM | POA: Diagnosis not present

## 2019-02-20 DIAGNOSIS — I509 Heart failure, unspecified: Secondary | ICD-10-CM | POA: Diagnosis not present

## 2019-02-20 DIAGNOSIS — I130 Hypertensive heart and chronic kidney disease with heart failure and stage 1 through stage 4 chronic kidney disease, or unspecified chronic kidney disease: Secondary | ICD-10-CM | POA: Diagnosis not present

## 2019-02-20 DIAGNOSIS — I25811 Atherosclerosis of native coronary artery of transplanted heart without angina pectoris: Secondary | ICD-10-CM | POA: Diagnosis not present

## 2019-02-20 DIAGNOSIS — Z48812 Encounter for surgical aftercare following surgery on the circulatory system: Secondary | ICD-10-CM | POA: Diagnosis not present

## 2019-02-20 DIAGNOSIS — N184 Chronic kidney disease, stage 4 (severe): Secondary | ICD-10-CM | POA: Diagnosis not present

## 2019-02-21 DIAGNOSIS — D509 Iron deficiency anemia, unspecified: Secondary | ICD-10-CM | POA: Diagnosis not present

## 2019-02-21 DIAGNOSIS — Z23 Encounter for immunization: Secondary | ICD-10-CM | POA: Diagnosis not present

## 2019-02-21 DIAGNOSIS — Z992 Dependence on renal dialysis: Secondary | ICD-10-CM | POA: Diagnosis not present

## 2019-02-21 DIAGNOSIS — N186 End stage renal disease: Secondary | ICD-10-CM | POA: Diagnosis not present

## 2019-02-21 DIAGNOSIS — I38 Endocarditis, valve unspecified: Secondary | ICD-10-CM | POA: Diagnosis not present

## 2019-02-21 DIAGNOSIS — D631 Anemia in chronic kidney disease: Secondary | ICD-10-CM | POA: Diagnosis not present

## 2019-02-22 DIAGNOSIS — Z48812 Encounter for surgical aftercare following surgery on the circulatory system: Secondary | ICD-10-CM | POA: Diagnosis not present

## 2019-02-22 DIAGNOSIS — Z941 Heart transplant status: Secondary | ICD-10-CM | POA: Diagnosis not present

## 2019-02-22 DIAGNOSIS — Z952 Presence of prosthetic heart valve: Secondary | ICD-10-CM | POA: Diagnosis not present

## 2019-02-22 DIAGNOSIS — I130 Hypertensive heart and chronic kidney disease with heart failure and stage 1 through stage 4 chronic kidney disease, or unspecified chronic kidney disease: Secondary | ICD-10-CM | POA: Diagnosis not present

## 2019-02-22 DIAGNOSIS — I358 Other nonrheumatic aortic valve disorders: Secondary | ICD-10-CM | POA: Diagnosis not present

## 2019-02-22 DIAGNOSIS — D631 Anemia in chronic kidney disease: Secondary | ICD-10-CM | POA: Diagnosis not present

## 2019-02-22 DIAGNOSIS — I509 Heart failure, unspecified: Secondary | ICD-10-CM | POA: Diagnosis not present

## 2019-02-22 DIAGNOSIS — I25811 Atherosclerosis of native coronary artery of transplanted heart without angina pectoris: Secondary | ICD-10-CM | POA: Diagnosis not present

## 2019-02-22 DIAGNOSIS — N184 Chronic kidney disease, stage 4 (severe): Secondary | ICD-10-CM | POA: Diagnosis not present

## 2019-02-22 DIAGNOSIS — B259 Cytomegaloviral disease, unspecified: Secondary | ICD-10-CM | POA: Diagnosis not present

## 2019-02-23 DIAGNOSIS — N186 End stage renal disease: Secondary | ICD-10-CM | POA: Diagnosis not present

## 2019-02-23 DIAGNOSIS — D509 Iron deficiency anemia, unspecified: Secondary | ICD-10-CM | POA: Diagnosis not present

## 2019-02-23 DIAGNOSIS — D631 Anemia in chronic kidney disease: Secondary | ICD-10-CM | POA: Diagnosis not present

## 2019-02-23 DIAGNOSIS — Z23 Encounter for immunization: Secondary | ICD-10-CM | POA: Diagnosis not present

## 2019-02-23 DIAGNOSIS — I38 Endocarditis, valve unspecified: Secondary | ICD-10-CM | POA: Diagnosis not present

## 2019-02-23 DIAGNOSIS — Z992 Dependence on renal dialysis: Secondary | ICD-10-CM | POA: Diagnosis not present

## 2019-02-26 DIAGNOSIS — D509 Iron deficiency anemia, unspecified: Secondary | ICD-10-CM | POA: Diagnosis not present

## 2019-02-26 DIAGNOSIS — N186 End stage renal disease: Secondary | ICD-10-CM | POA: Diagnosis not present

## 2019-02-26 DIAGNOSIS — D631 Anemia in chronic kidney disease: Secondary | ICD-10-CM | POA: Diagnosis not present

## 2019-02-26 DIAGNOSIS — Z23 Encounter for immunization: Secondary | ICD-10-CM | POA: Diagnosis not present

## 2019-02-26 DIAGNOSIS — I38 Endocarditis, valve unspecified: Secondary | ICD-10-CM | POA: Diagnosis not present

## 2019-02-26 DIAGNOSIS — Z992 Dependence on renal dialysis: Secondary | ICD-10-CM | POA: Diagnosis not present

## 2019-02-27 DIAGNOSIS — D631 Anemia in chronic kidney disease: Secondary | ICD-10-CM | POA: Diagnosis not present

## 2019-02-27 DIAGNOSIS — I509 Heart failure, unspecified: Secondary | ICD-10-CM | POA: Diagnosis not present

## 2019-02-27 DIAGNOSIS — Z48812 Encounter for surgical aftercare following surgery on the circulatory system: Secondary | ICD-10-CM | POA: Diagnosis not present

## 2019-02-27 DIAGNOSIS — I130 Hypertensive heart and chronic kidney disease with heart failure and stage 1 through stage 4 chronic kidney disease, or unspecified chronic kidney disease: Secondary | ICD-10-CM | POA: Diagnosis not present

## 2019-02-27 DIAGNOSIS — I25811 Atherosclerosis of native coronary artery of transplanted heart without angina pectoris: Secondary | ICD-10-CM | POA: Diagnosis not present

## 2019-02-27 DIAGNOSIS — N184 Chronic kidney disease, stage 4 (severe): Secondary | ICD-10-CM | POA: Diagnosis not present

## 2019-02-28 DIAGNOSIS — I38 Endocarditis, valve unspecified: Secondary | ICD-10-CM | POA: Diagnosis not present

## 2019-02-28 DIAGNOSIS — Z992 Dependence on renal dialysis: Secondary | ICD-10-CM | POA: Diagnosis not present

## 2019-02-28 DIAGNOSIS — Z23 Encounter for immunization: Secondary | ICD-10-CM | POA: Diagnosis not present

## 2019-02-28 DIAGNOSIS — D509 Iron deficiency anemia, unspecified: Secondary | ICD-10-CM | POA: Diagnosis not present

## 2019-02-28 DIAGNOSIS — N186 End stage renal disease: Secondary | ICD-10-CM | POA: Diagnosis not present

## 2019-02-28 DIAGNOSIS — D631 Anemia in chronic kidney disease: Secondary | ICD-10-CM | POA: Diagnosis not present

## 2019-03-01 DIAGNOSIS — I25811 Atherosclerosis of native coronary artery of transplanted heart without angina pectoris: Secondary | ICD-10-CM | POA: Diagnosis not present

## 2019-03-01 DIAGNOSIS — I130 Hypertensive heart and chronic kidney disease with heart failure and stage 1 through stage 4 chronic kidney disease, or unspecified chronic kidney disease: Secondary | ICD-10-CM | POA: Diagnosis not present

## 2019-03-01 DIAGNOSIS — D631 Anemia in chronic kidney disease: Secondary | ICD-10-CM | POA: Diagnosis not present

## 2019-03-01 DIAGNOSIS — Z48812 Encounter for surgical aftercare following surgery on the circulatory system: Secondary | ICD-10-CM | POA: Diagnosis not present

## 2019-03-01 DIAGNOSIS — N184 Chronic kidney disease, stage 4 (severe): Secondary | ICD-10-CM | POA: Diagnosis not present

## 2019-03-01 DIAGNOSIS — I509 Heart failure, unspecified: Secondary | ICD-10-CM | POA: Diagnosis not present

## 2019-03-02 DIAGNOSIS — D509 Iron deficiency anemia, unspecified: Secondary | ICD-10-CM | POA: Diagnosis not present

## 2019-03-02 DIAGNOSIS — Z23 Encounter for immunization: Secondary | ICD-10-CM | POA: Diagnosis not present

## 2019-03-02 DIAGNOSIS — I38 Endocarditis, valve unspecified: Secondary | ICD-10-CM | POA: Diagnosis not present

## 2019-03-02 DIAGNOSIS — N186 End stage renal disease: Secondary | ICD-10-CM | POA: Diagnosis not present

## 2019-03-02 DIAGNOSIS — Z992 Dependence on renal dialysis: Secondary | ICD-10-CM | POA: Diagnosis not present

## 2019-03-02 DIAGNOSIS — D631 Anemia in chronic kidney disease: Secondary | ICD-10-CM | POA: Diagnosis not present

## 2019-03-05 DIAGNOSIS — I38 Endocarditis, valve unspecified: Secondary | ICD-10-CM | POA: Diagnosis not present

## 2019-03-05 DIAGNOSIS — D509 Iron deficiency anemia, unspecified: Secondary | ICD-10-CM | POA: Diagnosis not present

## 2019-03-05 DIAGNOSIS — Z992 Dependence on renal dialysis: Secondary | ICD-10-CM | POA: Diagnosis not present

## 2019-03-05 DIAGNOSIS — N186 End stage renal disease: Secondary | ICD-10-CM | POA: Diagnosis not present

## 2019-03-05 DIAGNOSIS — D631 Anemia in chronic kidney disease: Secondary | ICD-10-CM | POA: Diagnosis not present

## 2019-03-05 DIAGNOSIS — Z23 Encounter for immunization: Secondary | ICD-10-CM | POA: Diagnosis not present

## 2019-03-06 DIAGNOSIS — I25811 Atherosclerosis of native coronary artery of transplanted heart without angina pectoris: Secondary | ICD-10-CM | POA: Diagnosis not present

## 2019-03-06 DIAGNOSIS — N184 Chronic kidney disease, stage 4 (severe): Secondary | ICD-10-CM | POA: Diagnosis not present

## 2019-03-06 DIAGNOSIS — I509 Heart failure, unspecified: Secondary | ICD-10-CM | POA: Diagnosis not present

## 2019-03-06 DIAGNOSIS — Z48812 Encounter for surgical aftercare following surgery on the circulatory system: Secondary | ICD-10-CM | POA: Diagnosis not present

## 2019-03-06 DIAGNOSIS — D631 Anemia in chronic kidney disease: Secondary | ICD-10-CM | POA: Diagnosis not present

## 2019-03-06 DIAGNOSIS — Z952 Presence of prosthetic heart valve: Secondary | ICD-10-CM | POA: Diagnosis not present

## 2019-03-06 DIAGNOSIS — I130 Hypertensive heart and chronic kidney disease with heart failure and stage 1 through stage 4 chronic kidney disease, or unspecified chronic kidney disease: Secondary | ICD-10-CM | POA: Diagnosis not present

## 2019-03-07 DIAGNOSIS — I38 Endocarditis, valve unspecified: Secondary | ICD-10-CM | POA: Diagnosis not present

## 2019-03-07 DIAGNOSIS — D631 Anemia in chronic kidney disease: Secondary | ICD-10-CM | POA: Diagnosis not present

## 2019-03-07 DIAGNOSIS — D509 Iron deficiency anemia, unspecified: Secondary | ICD-10-CM | POA: Diagnosis not present

## 2019-03-07 DIAGNOSIS — N186 End stage renal disease: Secondary | ICD-10-CM | POA: Diagnosis not present

## 2019-03-07 DIAGNOSIS — Z992 Dependence on renal dialysis: Secondary | ICD-10-CM | POA: Diagnosis not present

## 2019-03-07 DIAGNOSIS — Z23 Encounter for immunization: Secondary | ICD-10-CM | POA: Diagnosis not present

## 2019-03-08 DIAGNOSIS — D631 Anemia in chronic kidney disease: Secondary | ICD-10-CM | POA: Diagnosis not present

## 2019-03-08 DIAGNOSIS — Z48812 Encounter for surgical aftercare following surgery on the circulatory system: Secondary | ICD-10-CM | POA: Diagnosis not present

## 2019-03-08 DIAGNOSIS — Z941 Heart transplant status: Secondary | ICD-10-CM | POA: Diagnosis not present

## 2019-03-08 DIAGNOSIS — Z992 Dependence on renal dialysis: Secondary | ICD-10-CM | POA: Diagnosis not present

## 2019-03-08 DIAGNOSIS — I25811 Atherosclerosis of native coronary artery of transplanted heart without angina pectoris: Secondary | ICD-10-CM | POA: Diagnosis not present

## 2019-03-08 DIAGNOSIS — N184 Chronic kidney disease, stage 4 (severe): Secondary | ICD-10-CM | POA: Diagnosis not present

## 2019-03-08 DIAGNOSIS — N186 End stage renal disease: Secondary | ICD-10-CM | POA: Diagnosis not present

## 2019-03-08 DIAGNOSIS — I509 Heart failure, unspecified: Secondary | ICD-10-CM | POA: Diagnosis not present

## 2019-03-08 DIAGNOSIS — I130 Hypertensive heart and chronic kidney disease with heart failure and stage 1 through stage 4 chronic kidney disease, or unspecified chronic kidney disease: Secondary | ICD-10-CM | POA: Diagnosis not present

## 2019-03-08 DIAGNOSIS — Z79899 Other long term (current) drug therapy: Secondary | ICD-10-CM | POA: Diagnosis not present

## 2019-03-09 DIAGNOSIS — N186 End stage renal disease: Secondary | ICD-10-CM | POA: Diagnosis not present

## 2019-03-09 DIAGNOSIS — I38 Endocarditis, valve unspecified: Secondary | ICD-10-CM | POA: Diagnosis not present

## 2019-03-09 DIAGNOSIS — Z23 Encounter for immunization: Secondary | ICD-10-CM | POA: Diagnosis not present

## 2019-03-09 DIAGNOSIS — Z992 Dependence on renal dialysis: Secondary | ICD-10-CM | POA: Diagnosis not present

## 2019-03-09 DIAGNOSIS — D631 Anemia in chronic kidney disease: Secondary | ICD-10-CM | POA: Diagnosis not present

## 2019-03-09 DIAGNOSIS — D509 Iron deficiency anemia, unspecified: Secondary | ICD-10-CM | POA: Diagnosis not present

## 2019-03-12 DIAGNOSIS — D631 Anemia in chronic kidney disease: Secondary | ICD-10-CM | POA: Diagnosis not present

## 2019-03-12 DIAGNOSIS — I38 Endocarditis, valve unspecified: Secondary | ICD-10-CM | POA: Diagnosis not present

## 2019-03-12 DIAGNOSIS — N186 End stage renal disease: Secondary | ICD-10-CM | POA: Diagnosis not present

## 2019-03-12 DIAGNOSIS — D509 Iron deficiency anemia, unspecified: Secondary | ICD-10-CM | POA: Diagnosis not present

## 2019-03-12 DIAGNOSIS — Z23 Encounter for immunization: Secondary | ICD-10-CM | POA: Diagnosis not present

## 2019-03-12 DIAGNOSIS — Z992 Dependence on renal dialysis: Secondary | ICD-10-CM | POA: Diagnosis not present

## 2019-03-13 DIAGNOSIS — R531 Weakness: Secondary | ICD-10-CM | POA: Diagnosis not present

## 2019-03-13 DIAGNOSIS — Z6821 Body mass index (BMI) 21.0-21.9, adult: Secondary | ICD-10-CM | POA: Diagnosis not present

## 2019-03-13 DIAGNOSIS — I10 Essential (primary) hypertension: Secondary | ICD-10-CM | POA: Diagnosis not present

## 2019-03-13 DIAGNOSIS — K219 Gastro-esophageal reflux disease without esophagitis: Secondary | ICD-10-CM | POA: Diagnosis not present

## 2019-03-13 DIAGNOSIS — D582 Other hemoglobinopathies: Secondary | ICD-10-CM | POA: Diagnosis not present

## 2019-03-13 DIAGNOSIS — Z299 Encounter for prophylactic measures, unspecified: Secondary | ICD-10-CM | POA: Diagnosis not present

## 2019-03-13 DIAGNOSIS — Z87891 Personal history of nicotine dependence: Secondary | ICD-10-CM | POA: Diagnosis not present

## 2019-03-13 DIAGNOSIS — L7682 Other postprocedural complications of skin and subcutaneous tissue: Secondary | ICD-10-CM | POA: Diagnosis not present

## 2019-03-14 DIAGNOSIS — Z23 Encounter for immunization: Secondary | ICD-10-CM | POA: Diagnosis not present

## 2019-03-14 DIAGNOSIS — Z992 Dependence on renal dialysis: Secondary | ICD-10-CM | POA: Diagnosis not present

## 2019-03-14 DIAGNOSIS — D509 Iron deficiency anemia, unspecified: Secondary | ICD-10-CM | POA: Diagnosis not present

## 2019-03-14 DIAGNOSIS — D631 Anemia in chronic kidney disease: Secondary | ICD-10-CM | POA: Diagnosis not present

## 2019-03-14 DIAGNOSIS — N186 End stage renal disease: Secondary | ICD-10-CM | POA: Diagnosis not present

## 2019-03-14 DIAGNOSIS — I38 Endocarditis, valve unspecified: Secondary | ICD-10-CM | POA: Diagnosis not present

## 2019-03-15 DIAGNOSIS — I509 Heart failure, unspecified: Secondary | ICD-10-CM | POA: Diagnosis not present

## 2019-03-15 DIAGNOSIS — I130 Hypertensive heart and chronic kidney disease with heart failure and stage 1 through stage 4 chronic kidney disease, or unspecified chronic kidney disease: Secondary | ICD-10-CM | POA: Diagnosis not present

## 2019-03-15 DIAGNOSIS — D631 Anemia in chronic kidney disease: Secondary | ICD-10-CM | POA: Diagnosis not present

## 2019-03-15 DIAGNOSIS — Z48812 Encounter for surgical aftercare following surgery on the circulatory system: Secondary | ICD-10-CM | POA: Diagnosis not present

## 2019-03-15 DIAGNOSIS — I25811 Atherosclerosis of native coronary artery of transplanted heart without angina pectoris: Secondary | ICD-10-CM | POA: Diagnosis not present

## 2019-03-15 DIAGNOSIS — N184 Chronic kidney disease, stage 4 (severe): Secondary | ICD-10-CM | POA: Diagnosis not present

## 2019-03-16 DIAGNOSIS — Z992 Dependence on renal dialysis: Secondary | ICD-10-CM | POA: Diagnosis not present

## 2019-03-16 DIAGNOSIS — N186 End stage renal disease: Secondary | ICD-10-CM | POA: Diagnosis not present

## 2019-03-16 DIAGNOSIS — D631 Anemia in chronic kidney disease: Secondary | ICD-10-CM | POA: Diagnosis not present

## 2019-03-16 DIAGNOSIS — Z23 Encounter for immunization: Secondary | ICD-10-CM | POA: Diagnosis not present

## 2019-03-16 DIAGNOSIS — D509 Iron deficiency anemia, unspecified: Secondary | ICD-10-CM | POA: Diagnosis not present

## 2019-03-16 DIAGNOSIS — I38 Endocarditis, valve unspecified: Secondary | ICD-10-CM | POA: Diagnosis not present

## 2019-03-17 DIAGNOSIS — Z85048 Personal history of other malignant neoplasm of rectum, rectosigmoid junction, and anus: Secondary | ICD-10-CM | POA: Diagnosis not present

## 2019-03-17 DIAGNOSIS — N184 Chronic kidney disease, stage 4 (severe): Secondary | ICD-10-CM | POA: Diagnosis not present

## 2019-03-17 DIAGNOSIS — I429 Cardiomyopathy, unspecified: Secondary | ICD-10-CM | POA: Diagnosis not present

## 2019-03-17 DIAGNOSIS — I25811 Atherosclerosis of native coronary artery of transplanted heart without angina pectoris: Secondary | ICD-10-CM | POA: Diagnosis not present

## 2019-03-17 DIAGNOSIS — D631 Anemia in chronic kidney disease: Secondary | ICD-10-CM | POA: Diagnosis not present

## 2019-03-17 DIAGNOSIS — I130 Hypertensive heart and chronic kidney disease with heart failure and stage 1 through stage 4 chronic kidney disease, or unspecified chronic kidney disease: Secondary | ICD-10-CM | POA: Diagnosis not present

## 2019-03-17 DIAGNOSIS — Z48812 Encounter for surgical aftercare following surgery on the circulatory system: Secondary | ICD-10-CM | POA: Diagnosis not present

## 2019-03-17 DIAGNOSIS — Z952 Presence of prosthetic heart valve: Secondary | ICD-10-CM | POA: Diagnosis not present

## 2019-03-17 DIAGNOSIS — I509 Heart failure, unspecified: Secondary | ICD-10-CM | POA: Diagnosis not present

## 2019-03-17 DIAGNOSIS — Z941 Heart transplant status: Secondary | ICD-10-CM | POA: Diagnosis not present

## 2019-03-17 DIAGNOSIS — Z950 Presence of cardiac pacemaker: Secondary | ICD-10-CM | POA: Diagnosis not present

## 2019-03-17 DIAGNOSIS — Z87891 Personal history of nicotine dependence: Secondary | ICD-10-CM | POA: Diagnosis not present

## 2019-03-19 DIAGNOSIS — Z23 Encounter for immunization: Secondary | ICD-10-CM | POA: Diagnosis not present

## 2019-03-19 DIAGNOSIS — Z992 Dependence on renal dialysis: Secondary | ICD-10-CM | POA: Diagnosis not present

## 2019-03-19 DIAGNOSIS — D631 Anemia in chronic kidney disease: Secondary | ICD-10-CM | POA: Diagnosis not present

## 2019-03-19 DIAGNOSIS — I38 Endocarditis, valve unspecified: Secondary | ICD-10-CM | POA: Diagnosis not present

## 2019-03-19 DIAGNOSIS — N186 End stage renal disease: Secondary | ICD-10-CM | POA: Diagnosis not present

## 2019-03-19 DIAGNOSIS — D509 Iron deficiency anemia, unspecified: Secondary | ICD-10-CM | POA: Diagnosis not present

## 2019-03-20 DIAGNOSIS — K56609 Unspecified intestinal obstruction, unspecified as to partial versus complete obstruction: Secondary | ICD-10-CM | POA: Insufficient documentation

## 2019-03-20 DIAGNOSIS — Z8249 Family history of ischemic heart disease and other diseases of the circulatory system: Secondary | ICD-10-CM | POA: Diagnosis not present

## 2019-03-20 DIAGNOSIS — I472 Ventricular tachycardia: Secondary | ICD-10-CM | POA: Diagnosis not present

## 2019-03-20 DIAGNOSIS — E7800 Pure hypercholesterolemia, unspecified: Secondary | ICD-10-CM | POA: Diagnosis not present

## 2019-03-20 DIAGNOSIS — R109 Unspecified abdominal pain: Secondary | ICD-10-CM | POA: Diagnosis not present

## 2019-03-20 DIAGNOSIS — L299 Pruritus, unspecified: Secondary | ICD-10-CM | POA: Diagnosis not present

## 2019-03-20 DIAGNOSIS — K219 Gastro-esophageal reflux disease without esophagitis: Secondary | ICD-10-CM | POA: Diagnosis not present

## 2019-03-20 DIAGNOSIS — E8770 Fluid overload, unspecified: Secondary | ICD-10-CM | POA: Diagnosis not present

## 2019-03-20 DIAGNOSIS — K56699 Other intestinal obstruction unspecified as to partial versus complete obstruction: Secondary | ICD-10-CM | POA: Diagnosis not present

## 2019-03-20 DIAGNOSIS — I700 Atherosclerosis of aorta: Secondary | ICD-10-CM | POA: Diagnosis not present

## 2019-03-20 DIAGNOSIS — Z992 Dependence on renal dialysis: Secondary | ICD-10-CM | POA: Insufficient documentation

## 2019-03-20 DIAGNOSIS — Z941 Heart transplant status: Secondary | ICD-10-CM | POA: Diagnosis not present

## 2019-03-20 DIAGNOSIS — D72819 Decreased white blood cell count, unspecified: Secondary | ICD-10-CM | POA: Diagnosis present

## 2019-03-20 DIAGNOSIS — Z833 Family history of diabetes mellitus: Secondary | ICD-10-CM | POA: Diagnosis not present

## 2019-03-20 DIAGNOSIS — R5383 Other fatigue: Secondary | ICD-10-CM | POA: Diagnosis not present

## 2019-03-20 DIAGNOSIS — K56691 Other complete intestinal obstruction: Secondary | ICD-10-CM | POA: Diagnosis not present

## 2019-03-20 DIAGNOSIS — E43 Unspecified severe protein-calorie malnutrition: Secondary | ICD-10-CM | POA: Diagnosis not present

## 2019-03-20 DIAGNOSIS — I120 Hypertensive chronic kidney disease with stage 5 chronic kidney disease or end stage renal disease: Secondary | ICD-10-CM | POA: Diagnosis not present

## 2019-03-20 DIAGNOSIS — R000 Tachycardia, unspecified: Secondary | ICD-10-CM | POA: Diagnosis not present

## 2019-03-20 DIAGNOSIS — R600 Localized edema: Secondary | ICD-10-CM | POA: Diagnosis not present

## 2019-03-20 DIAGNOSIS — Z4659 Encounter for fitting and adjustment of other gastrointestinal appliance and device: Secondary | ICD-10-CM | POA: Diagnosis not present

## 2019-03-20 DIAGNOSIS — D631 Anemia in chronic kidney disease: Secondary | ICD-10-CM | POA: Diagnosis not present

## 2019-03-20 DIAGNOSIS — R1030 Lower abdominal pain, unspecified: Secondary | ICD-10-CM | POA: Diagnosis not present

## 2019-03-20 DIAGNOSIS — N186 End stage renal disease: Secondary | ICD-10-CM | POA: Insufficient documentation

## 2019-03-20 DIAGNOSIS — I959 Hypotension, unspecified: Secondary | ICD-10-CM | POA: Diagnosis not present

## 2019-03-20 DIAGNOSIS — R05 Cough: Secondary | ICD-10-CM | POA: Diagnosis not present

## 2019-03-20 DIAGNOSIS — Z79899 Other long term (current) drug therapy: Secondary | ICD-10-CM | POA: Diagnosis not present

## 2019-03-20 DIAGNOSIS — Z87898 Personal history of other specified conditions: Secondary | ICD-10-CM | POA: Diagnosis not present

## 2019-03-20 DIAGNOSIS — Z87891 Personal history of nicotine dependence: Secondary | ICD-10-CM | POA: Diagnosis not present

## 2019-03-20 DIAGNOSIS — R52 Pain, unspecified: Secondary | ICD-10-CM | POA: Diagnosis not present

## 2019-03-20 DIAGNOSIS — Z9889 Other specified postprocedural states: Secondary | ICD-10-CM | POA: Diagnosis not present

## 2019-03-20 DIAGNOSIS — Z7982 Long term (current) use of aspirin: Secondary | ICD-10-CM | POA: Diagnosis not present

## 2019-03-20 DIAGNOSIS — R6883 Chills (without fever): Secondary | ICD-10-CM | POA: Diagnosis not present

## 2019-03-20 DIAGNOSIS — R112 Nausea with vomiting, unspecified: Secondary | ICD-10-CM | POA: Diagnosis not present

## 2019-03-20 DIAGNOSIS — R1084 Generalized abdominal pain: Secondary | ICD-10-CM | POA: Diagnosis not present

## 2019-03-20 DIAGNOSIS — K660 Peritoneal adhesions (postprocedural) (postinfection): Secondary | ICD-10-CM | POA: Diagnosis not present

## 2019-03-20 DIAGNOSIS — Z85048 Personal history of other malignant neoplasm of rectum, rectosigmoid junction, and anus: Secondary | ICD-10-CM | POA: Diagnosis not present

## 2019-03-20 DIAGNOSIS — Z809 Family history of malignant neoplasm, unspecified: Secondary | ICD-10-CM | POA: Diagnosis not present

## 2019-03-20 DIAGNOSIS — R197 Diarrhea, unspecified: Secondary | ICD-10-CM | POA: Diagnosis not present

## 2019-03-20 DIAGNOSIS — K5652 Intestinal adhesions [bands] with complete obstruction: Secondary | ICD-10-CM | POA: Diagnosis not present

## 2019-03-20 DIAGNOSIS — I25811 Atherosclerosis of native coronary artery of transplanted heart without angina pectoris: Secondary | ICD-10-CM | POA: Diagnosis not present

## 2019-03-28 DIAGNOSIS — Z992 Dependence on renal dialysis: Secondary | ICD-10-CM | POA: Diagnosis not present

## 2019-03-28 DIAGNOSIS — I38 Endocarditis, valve unspecified: Secondary | ICD-10-CM | POA: Diagnosis not present

## 2019-03-28 DIAGNOSIS — Z23 Encounter for immunization: Secondary | ICD-10-CM | POA: Diagnosis not present

## 2019-03-28 DIAGNOSIS — D631 Anemia in chronic kidney disease: Secondary | ICD-10-CM | POA: Diagnosis not present

## 2019-03-28 DIAGNOSIS — N186 End stage renal disease: Secondary | ICD-10-CM | POA: Diagnosis not present

## 2019-03-28 DIAGNOSIS — D509 Iron deficiency anemia, unspecified: Secondary | ICD-10-CM | POA: Diagnosis not present

## 2019-03-29 DIAGNOSIS — I509 Heart failure, unspecified: Secondary | ICD-10-CM | POA: Diagnosis not present

## 2019-03-29 DIAGNOSIS — N184 Chronic kidney disease, stage 4 (severe): Secondary | ICD-10-CM | POA: Diagnosis not present

## 2019-03-29 DIAGNOSIS — I25811 Atherosclerosis of native coronary artery of transplanted heart without angina pectoris: Secondary | ICD-10-CM | POA: Diagnosis not present

## 2019-03-29 DIAGNOSIS — D631 Anemia in chronic kidney disease: Secondary | ICD-10-CM | POA: Diagnosis not present

## 2019-03-29 DIAGNOSIS — I130 Hypertensive heart and chronic kidney disease with heart failure and stage 1 through stage 4 chronic kidney disease, or unspecified chronic kidney disease: Secondary | ICD-10-CM | POA: Diagnosis not present

## 2019-03-29 DIAGNOSIS — Z48812 Encounter for surgical aftercare following surgery on the circulatory system: Secondary | ICD-10-CM | POA: Diagnosis not present

## 2019-03-30 DIAGNOSIS — Z23 Encounter for immunization: Secondary | ICD-10-CM | POA: Diagnosis not present

## 2019-03-30 DIAGNOSIS — D631 Anemia in chronic kidney disease: Secondary | ICD-10-CM | POA: Diagnosis not present

## 2019-03-30 DIAGNOSIS — D509 Iron deficiency anemia, unspecified: Secondary | ICD-10-CM | POA: Diagnosis not present

## 2019-03-30 DIAGNOSIS — N186 End stage renal disease: Secondary | ICD-10-CM | POA: Diagnosis not present

## 2019-03-30 DIAGNOSIS — I38 Endocarditis, valve unspecified: Secondary | ICD-10-CM | POA: Diagnosis not present

## 2019-03-30 DIAGNOSIS — Z992 Dependence on renal dialysis: Secondary | ICD-10-CM | POA: Diagnosis not present

## 2019-04-01 DIAGNOSIS — I130 Hypertensive heart and chronic kidney disease with heart failure and stage 1 through stage 4 chronic kidney disease, or unspecified chronic kidney disease: Secondary | ICD-10-CM | POA: Diagnosis not present

## 2019-04-01 DIAGNOSIS — Z48812 Encounter for surgical aftercare following surgery on the circulatory system: Secondary | ICD-10-CM | POA: Diagnosis not present

## 2019-04-01 DIAGNOSIS — N184 Chronic kidney disease, stage 4 (severe): Secondary | ICD-10-CM | POA: Diagnosis not present

## 2019-04-01 DIAGNOSIS — I509 Heart failure, unspecified: Secondary | ICD-10-CM | POA: Diagnosis not present

## 2019-04-01 DIAGNOSIS — D631 Anemia in chronic kidney disease: Secondary | ICD-10-CM | POA: Diagnosis not present

## 2019-04-01 DIAGNOSIS — I25811 Atherosclerosis of native coronary artery of transplanted heart without angina pectoris: Secondary | ICD-10-CM | POA: Diagnosis not present

## 2019-04-02 DIAGNOSIS — I38 Endocarditis, valve unspecified: Secondary | ICD-10-CM | POA: Diagnosis not present

## 2019-04-02 DIAGNOSIS — D509 Iron deficiency anemia, unspecified: Secondary | ICD-10-CM | POA: Diagnosis not present

## 2019-04-02 DIAGNOSIS — Z992 Dependence on renal dialysis: Secondary | ICD-10-CM | POA: Diagnosis not present

## 2019-04-02 DIAGNOSIS — D631 Anemia in chronic kidney disease: Secondary | ICD-10-CM | POA: Diagnosis not present

## 2019-04-02 DIAGNOSIS — Z23 Encounter for immunization: Secondary | ICD-10-CM | POA: Diagnosis not present

## 2019-04-02 DIAGNOSIS — N186 End stage renal disease: Secondary | ICD-10-CM | POA: Diagnosis not present

## 2019-04-03 DIAGNOSIS — I130 Hypertensive heart and chronic kidney disease with heart failure and stage 1 through stage 4 chronic kidney disease, or unspecified chronic kidney disease: Secondary | ICD-10-CM | POA: Diagnosis not present

## 2019-04-03 DIAGNOSIS — I509 Heart failure, unspecified: Secondary | ICD-10-CM | POA: Diagnosis not present

## 2019-04-03 DIAGNOSIS — N184 Chronic kidney disease, stage 4 (severe): Secondary | ICD-10-CM | POA: Diagnosis not present

## 2019-04-03 DIAGNOSIS — I25811 Atherosclerosis of native coronary artery of transplanted heart without angina pectoris: Secondary | ICD-10-CM | POA: Diagnosis not present

## 2019-04-03 DIAGNOSIS — N186 End stage renal disease: Secondary | ICD-10-CM | POA: Diagnosis not present

## 2019-04-03 DIAGNOSIS — D631 Anemia in chronic kidney disease: Secondary | ICD-10-CM | POA: Diagnosis not present

## 2019-04-03 DIAGNOSIS — Z85048 Personal history of other malignant neoplasm of rectum, rectosigmoid junction, and anus: Secondary | ICD-10-CM | POA: Diagnosis not present

## 2019-04-03 DIAGNOSIS — Z48812 Encounter for surgical aftercare following surgery on the circulatory system: Secondary | ICD-10-CM | POA: Diagnosis not present

## 2019-04-04 DIAGNOSIS — N186 End stage renal disease: Secondary | ICD-10-CM | POA: Diagnosis not present

## 2019-04-04 DIAGNOSIS — D631 Anemia in chronic kidney disease: Secondary | ICD-10-CM | POA: Diagnosis not present

## 2019-04-04 DIAGNOSIS — D509 Iron deficiency anemia, unspecified: Secondary | ICD-10-CM | POA: Diagnosis not present

## 2019-04-04 DIAGNOSIS — I38 Endocarditis, valve unspecified: Secondary | ICD-10-CM | POA: Diagnosis not present

## 2019-04-04 DIAGNOSIS — Z23 Encounter for immunization: Secondary | ICD-10-CM | POA: Diagnosis not present

## 2019-04-04 DIAGNOSIS — Z992 Dependence on renal dialysis: Secondary | ICD-10-CM | POA: Diagnosis not present

## 2019-04-06 DIAGNOSIS — Z23 Encounter for immunization: Secondary | ICD-10-CM | POA: Diagnosis not present

## 2019-04-06 DIAGNOSIS — I38 Endocarditis, valve unspecified: Secondary | ICD-10-CM | POA: Diagnosis not present

## 2019-04-06 DIAGNOSIS — Z992 Dependence on renal dialysis: Secondary | ICD-10-CM | POA: Diagnosis not present

## 2019-04-06 DIAGNOSIS — N186 End stage renal disease: Secondary | ICD-10-CM | POA: Diagnosis not present

## 2019-04-06 DIAGNOSIS — D509 Iron deficiency anemia, unspecified: Secondary | ICD-10-CM | POA: Diagnosis not present

## 2019-04-06 DIAGNOSIS — D631 Anemia in chronic kidney disease: Secondary | ICD-10-CM | POA: Diagnosis not present

## 2019-04-08 DIAGNOSIS — Z992 Dependence on renal dialysis: Secondary | ICD-10-CM | POA: Diagnosis not present

## 2019-04-08 DIAGNOSIS — N186 End stage renal disease: Secondary | ICD-10-CM | POA: Diagnosis not present

## 2019-04-09 DIAGNOSIS — Z23 Encounter for immunization: Secondary | ICD-10-CM | POA: Diagnosis not present

## 2019-04-09 DIAGNOSIS — N186 End stage renal disease: Secondary | ICD-10-CM | POA: Diagnosis not present

## 2019-04-09 DIAGNOSIS — D631 Anemia in chronic kidney disease: Secondary | ICD-10-CM | POA: Diagnosis not present

## 2019-04-09 DIAGNOSIS — D509 Iron deficiency anemia, unspecified: Secondary | ICD-10-CM | POA: Diagnosis not present

## 2019-04-09 DIAGNOSIS — N250 Renal osteodystrophy: Secondary | ICD-10-CM | POA: Diagnosis not present

## 2019-04-09 DIAGNOSIS — Z992 Dependence on renal dialysis: Secondary | ICD-10-CM | POA: Diagnosis not present

## 2019-04-10 DIAGNOSIS — Z299 Encounter for prophylactic measures, unspecified: Secondary | ICD-10-CM | POA: Diagnosis not present

## 2019-04-10 DIAGNOSIS — R1084 Generalized abdominal pain: Secondary | ICD-10-CM | POA: Diagnosis not present

## 2019-04-10 DIAGNOSIS — L7682 Other postprocedural complications of skin and subcutaneous tissue: Secondary | ICD-10-CM | POA: Diagnosis not present

## 2019-04-10 DIAGNOSIS — K56600 Partial intestinal obstruction, unspecified as to cause: Secondary | ICD-10-CM | POA: Diagnosis not present

## 2019-04-10 DIAGNOSIS — I358 Other nonrheumatic aortic valve disorders: Secondary | ICD-10-CM | POA: Diagnosis not present

## 2019-04-10 DIAGNOSIS — Z79899 Other long term (current) drug therapy: Secondary | ICD-10-CM | POA: Diagnosis not present

## 2019-04-10 DIAGNOSIS — Z6821 Body mass index (BMI) 21.0-21.9, adult: Secondary | ICD-10-CM | POA: Diagnosis not present

## 2019-04-10 DIAGNOSIS — Z950 Presence of cardiac pacemaker: Secondary | ICD-10-CM | POA: Diagnosis not present

## 2019-04-10 DIAGNOSIS — Z941 Heart transplant status: Secondary | ICD-10-CM | POA: Diagnosis not present

## 2019-04-10 DIAGNOSIS — R102 Pelvic and perineal pain: Secondary | ICD-10-CM | POA: Diagnosis not present

## 2019-04-11 DIAGNOSIS — N186 End stage renal disease: Secondary | ICD-10-CM | POA: Diagnosis not present

## 2019-04-11 DIAGNOSIS — Z992 Dependence on renal dialysis: Secondary | ICD-10-CM | POA: Diagnosis not present

## 2019-04-11 DIAGNOSIS — Z23 Encounter for immunization: Secondary | ICD-10-CM | POA: Diagnosis not present

## 2019-04-11 DIAGNOSIS — D509 Iron deficiency anemia, unspecified: Secondary | ICD-10-CM | POA: Diagnosis not present

## 2019-04-11 DIAGNOSIS — N250 Renal osteodystrophy: Secondary | ICD-10-CM | POA: Diagnosis not present

## 2019-04-11 DIAGNOSIS — D631 Anemia in chronic kidney disease: Secondary | ICD-10-CM | POA: Diagnosis not present

## 2019-04-12 DIAGNOSIS — Z48812 Encounter for surgical aftercare following surgery on the circulatory system: Secondary | ICD-10-CM | POA: Diagnosis not present

## 2019-04-12 DIAGNOSIS — I25811 Atherosclerosis of native coronary artery of transplanted heart without angina pectoris: Secondary | ICD-10-CM | POA: Diagnosis not present

## 2019-04-12 DIAGNOSIS — N184 Chronic kidney disease, stage 4 (severe): Secondary | ICD-10-CM | POA: Diagnosis not present

## 2019-04-12 DIAGNOSIS — I509 Heart failure, unspecified: Secondary | ICD-10-CM | POA: Diagnosis not present

## 2019-04-12 DIAGNOSIS — Z941 Heart transplant status: Secondary | ICD-10-CM | POA: Diagnosis not present

## 2019-04-12 DIAGNOSIS — I130 Hypertensive heart and chronic kidney disease with heart failure and stage 1 through stage 4 chronic kidney disease, or unspecified chronic kidney disease: Secondary | ICD-10-CM | POA: Diagnosis not present

## 2019-04-12 DIAGNOSIS — D631 Anemia in chronic kidney disease: Secondary | ICD-10-CM | POA: Diagnosis not present

## 2019-04-13 DIAGNOSIS — N250 Renal osteodystrophy: Secondary | ICD-10-CM | POA: Diagnosis not present

## 2019-04-13 DIAGNOSIS — D631 Anemia in chronic kidney disease: Secondary | ICD-10-CM | POA: Diagnosis not present

## 2019-04-13 DIAGNOSIS — Z992 Dependence on renal dialysis: Secondary | ICD-10-CM | POA: Diagnosis not present

## 2019-04-13 DIAGNOSIS — D509 Iron deficiency anemia, unspecified: Secondary | ICD-10-CM | POA: Diagnosis not present

## 2019-04-13 DIAGNOSIS — Z23 Encounter for immunization: Secondary | ICD-10-CM | POA: Diagnosis not present

## 2019-04-13 DIAGNOSIS — N186 End stage renal disease: Secondary | ICD-10-CM | POA: Diagnosis not present

## 2019-04-16 DIAGNOSIS — Z85048 Personal history of other malignant neoplasm of rectum, rectosigmoid junction, and anus: Secondary | ICD-10-CM | POA: Diagnosis not present

## 2019-04-16 DIAGNOSIS — Z941 Heart transplant status: Secondary | ICD-10-CM | POA: Diagnosis not present

## 2019-04-16 DIAGNOSIS — D631 Anemia in chronic kidney disease: Secondary | ICD-10-CM | POA: Diagnosis not present

## 2019-04-16 DIAGNOSIS — I429 Cardiomyopathy, unspecified: Secondary | ICD-10-CM | POA: Diagnosis not present

## 2019-04-16 DIAGNOSIS — Z23 Encounter for immunization: Secondary | ICD-10-CM | POA: Diagnosis not present

## 2019-04-16 DIAGNOSIS — Z952 Presence of prosthetic heart valve: Secondary | ICD-10-CM | POA: Diagnosis not present

## 2019-04-16 DIAGNOSIS — N250 Renal osteodystrophy: Secondary | ICD-10-CM | POA: Diagnosis not present

## 2019-04-16 DIAGNOSIS — Z87891 Personal history of nicotine dependence: Secondary | ICD-10-CM | POA: Diagnosis not present

## 2019-04-16 DIAGNOSIS — I132 Hypertensive heart and chronic kidney disease with heart failure and with stage 5 chronic kidney disease, or end stage renal disease: Secondary | ICD-10-CM | POA: Diagnosis not present

## 2019-04-16 DIAGNOSIS — I25811 Atherosclerosis of native coronary artery of transplanted heart without angina pectoris: Secondary | ICD-10-CM | POA: Diagnosis not present

## 2019-04-16 DIAGNOSIS — I509 Heart failure, unspecified: Secondary | ICD-10-CM | POA: Diagnosis not present

## 2019-04-16 DIAGNOSIS — Z992 Dependence on renal dialysis: Secondary | ICD-10-CM | POA: Diagnosis not present

## 2019-04-16 DIAGNOSIS — Z48815 Encounter for surgical aftercare following surgery on the digestive system: Secondary | ICD-10-CM | POA: Diagnosis not present

## 2019-04-16 DIAGNOSIS — Z950 Presence of cardiac pacemaker: Secondary | ICD-10-CM | POA: Diagnosis not present

## 2019-04-16 DIAGNOSIS — D509 Iron deficiency anemia, unspecified: Secondary | ICD-10-CM | POA: Diagnosis not present

## 2019-04-16 DIAGNOSIS — N186 End stage renal disease: Secondary | ICD-10-CM | POA: Diagnosis not present

## 2019-04-18 DIAGNOSIS — N250 Renal osteodystrophy: Secondary | ICD-10-CM | POA: Diagnosis not present

## 2019-04-18 DIAGNOSIS — N186 End stage renal disease: Secondary | ICD-10-CM | POA: Diagnosis not present

## 2019-04-18 DIAGNOSIS — Z23 Encounter for immunization: Secondary | ICD-10-CM | POA: Diagnosis not present

## 2019-04-18 DIAGNOSIS — D509 Iron deficiency anemia, unspecified: Secondary | ICD-10-CM | POA: Diagnosis not present

## 2019-04-18 DIAGNOSIS — D631 Anemia in chronic kidney disease: Secondary | ICD-10-CM | POA: Diagnosis not present

## 2019-04-18 DIAGNOSIS — Z992 Dependence on renal dialysis: Secondary | ICD-10-CM | POA: Diagnosis not present

## 2019-04-19 ENCOUNTER — Telehealth (HOSPITAL_COMMUNITY): Payer: Self-pay | Admitting: *Deleted

## 2019-04-19 DIAGNOSIS — I25811 Atherosclerosis of native coronary artery of transplanted heart without angina pectoris: Secondary | ICD-10-CM | POA: Diagnosis not present

## 2019-04-19 DIAGNOSIS — R61 Generalized hyperhidrosis: Secondary | ICD-10-CM | POA: Diagnosis not present

## 2019-04-19 DIAGNOSIS — R102 Pelvic and perineal pain: Secondary | ICD-10-CM | POA: Diagnosis not present

## 2019-04-19 DIAGNOSIS — Z941 Heart transplant status: Secondary | ICD-10-CM | POA: Diagnosis not present

## 2019-04-19 DIAGNOSIS — Z79899 Other long term (current) drug therapy: Secondary | ICD-10-CM | POA: Diagnosis not present

## 2019-04-19 DIAGNOSIS — D631 Anemia in chronic kidney disease: Secondary | ICD-10-CM | POA: Diagnosis not present

## 2019-04-19 DIAGNOSIS — R110 Nausea: Secondary | ICD-10-CM | POA: Diagnosis not present

## 2019-04-19 DIAGNOSIS — Z87891 Personal history of nicotine dependence: Secondary | ICD-10-CM | POA: Diagnosis not present

## 2019-04-19 DIAGNOSIS — Z48815 Encounter for surgical aftercare following surgery on the digestive system: Secondary | ICD-10-CM | POA: Diagnosis not present

## 2019-04-19 DIAGNOSIS — N186 End stage renal disease: Secondary | ICD-10-CM | POA: Diagnosis not present

## 2019-04-19 DIAGNOSIS — I120 Hypertensive chronic kidney disease with stage 5 chronic kidney disease or end stage renal disease: Secondary | ICD-10-CM | POA: Diagnosis not present

## 2019-04-19 DIAGNOSIS — I509 Heart failure, unspecified: Secondary | ICD-10-CM | POA: Diagnosis not present

## 2019-04-19 DIAGNOSIS — Z992 Dependence on renal dialysis: Secondary | ICD-10-CM | POA: Diagnosis not present

## 2019-04-19 DIAGNOSIS — Z952 Presence of prosthetic heart valve: Secondary | ICD-10-CM | POA: Diagnosis not present

## 2019-04-19 DIAGNOSIS — I132 Hypertensive heart and chronic kidney disease with heart failure and with stage 5 chronic kidney disease, or end stage renal disease: Secondary | ICD-10-CM | POA: Diagnosis not present

## 2019-04-19 DIAGNOSIS — Z950 Presence of cardiac pacemaker: Secondary | ICD-10-CM | POA: Diagnosis not present

## 2019-04-19 DIAGNOSIS — R198 Other specified symptoms and signs involving the digestive system and abdomen: Secondary | ICD-10-CM | POA: Diagnosis not present

## 2019-04-19 DIAGNOSIS — R232 Flushing: Secondary | ICD-10-CM | POA: Diagnosis not present

## 2019-04-19 NOTE — Telephone Encounter (Signed)
Spoke with patient. He has decided not to participate in this virtual platform at this time. He is on dialysis 3 x week and is not feeling well right now. He would much rather wait until program re-opens and get started then.

## 2019-04-20 DIAGNOSIS — Z992 Dependence on renal dialysis: Secondary | ICD-10-CM | POA: Diagnosis not present

## 2019-04-20 DIAGNOSIS — N250 Renal osteodystrophy: Secondary | ICD-10-CM | POA: Diagnosis not present

## 2019-04-20 DIAGNOSIS — Z23 Encounter for immunization: Secondary | ICD-10-CM | POA: Diagnosis not present

## 2019-04-20 DIAGNOSIS — D509 Iron deficiency anemia, unspecified: Secondary | ICD-10-CM | POA: Diagnosis not present

## 2019-04-20 DIAGNOSIS — D631 Anemia in chronic kidney disease: Secondary | ICD-10-CM | POA: Diagnosis not present

## 2019-04-20 DIAGNOSIS — N186 End stage renal disease: Secondary | ICD-10-CM | POA: Diagnosis not present

## 2019-04-21 DIAGNOSIS — E8770 Fluid overload, unspecified: Secondary | ICD-10-CM | POA: Diagnosis not present

## 2019-04-21 DIAGNOSIS — R110 Nausea: Secondary | ICD-10-CM | POA: Diagnosis not present

## 2019-04-21 DIAGNOSIS — Z992 Dependence on renal dialysis: Secondary | ICD-10-CM | POA: Diagnosis not present

## 2019-04-21 DIAGNOSIS — N186 End stage renal disease: Secondary | ICD-10-CM | POA: Diagnosis not present

## 2019-04-23 DIAGNOSIS — N250 Renal osteodystrophy: Secondary | ICD-10-CM | POA: Diagnosis not present

## 2019-04-23 DIAGNOSIS — Z992 Dependence on renal dialysis: Secondary | ICD-10-CM | POA: Diagnosis not present

## 2019-04-23 DIAGNOSIS — N186 End stage renal disease: Secondary | ICD-10-CM | POA: Diagnosis not present

## 2019-04-23 DIAGNOSIS — D509 Iron deficiency anemia, unspecified: Secondary | ICD-10-CM | POA: Diagnosis not present

## 2019-04-23 DIAGNOSIS — D631 Anemia in chronic kidney disease: Secondary | ICD-10-CM | POA: Diagnosis not present

## 2019-04-23 DIAGNOSIS — Z23 Encounter for immunization: Secondary | ICD-10-CM | POA: Diagnosis not present

## 2019-04-24 DIAGNOSIS — Z79899 Other long term (current) drug therapy: Secondary | ICD-10-CM | POA: Diagnosis not present

## 2019-04-24 DIAGNOSIS — D631 Anemia in chronic kidney disease: Secondary | ICD-10-CM | POA: Diagnosis not present

## 2019-04-24 DIAGNOSIS — Z941 Heart transplant status: Secondary | ICD-10-CM | POA: Diagnosis not present

## 2019-04-24 DIAGNOSIS — I351 Nonrheumatic aortic (valve) insufficiency: Secondary | ICD-10-CM | POA: Diagnosis not present

## 2019-04-24 DIAGNOSIS — N184 Chronic kidney disease, stage 4 (severe): Secondary | ICD-10-CM | POA: Diagnosis not present

## 2019-04-25 DIAGNOSIS — Z992 Dependence on renal dialysis: Secondary | ICD-10-CM | POA: Diagnosis not present

## 2019-04-25 DIAGNOSIS — N250 Renal osteodystrophy: Secondary | ICD-10-CM | POA: Diagnosis not present

## 2019-04-25 DIAGNOSIS — D631 Anemia in chronic kidney disease: Secondary | ICD-10-CM | POA: Diagnosis not present

## 2019-04-25 DIAGNOSIS — N186 End stage renal disease: Secondary | ICD-10-CM | POA: Diagnosis not present

## 2019-04-25 DIAGNOSIS — D509 Iron deficiency anemia, unspecified: Secondary | ICD-10-CM | POA: Diagnosis not present

## 2019-04-25 DIAGNOSIS — Z23 Encounter for immunization: Secondary | ICD-10-CM | POA: Diagnosis not present

## 2019-04-26 DIAGNOSIS — I25811 Atherosclerosis of native coronary artery of transplanted heart without angina pectoris: Secondary | ICD-10-CM | POA: Diagnosis not present

## 2019-04-26 DIAGNOSIS — N186 End stage renal disease: Secondary | ICD-10-CM | POA: Diagnosis not present

## 2019-04-26 DIAGNOSIS — Z48815 Encounter for surgical aftercare following surgery on the digestive system: Secondary | ICD-10-CM | POA: Diagnosis not present

## 2019-04-26 DIAGNOSIS — I132 Hypertensive heart and chronic kidney disease with heart failure and with stage 5 chronic kidney disease, or end stage renal disease: Secondary | ICD-10-CM | POA: Diagnosis not present

## 2019-04-26 DIAGNOSIS — D631 Anemia in chronic kidney disease: Secondary | ICD-10-CM | POA: Diagnosis not present

## 2019-04-26 DIAGNOSIS — I509 Heart failure, unspecified: Secondary | ICD-10-CM | POA: Diagnosis not present

## 2019-04-27 DIAGNOSIS — D509 Iron deficiency anemia, unspecified: Secondary | ICD-10-CM | POA: Diagnosis not present

## 2019-04-27 DIAGNOSIS — D631 Anemia in chronic kidney disease: Secondary | ICD-10-CM | POA: Diagnosis not present

## 2019-04-27 DIAGNOSIS — N186 End stage renal disease: Secondary | ICD-10-CM | POA: Diagnosis not present

## 2019-04-27 DIAGNOSIS — Z23 Encounter for immunization: Secondary | ICD-10-CM | POA: Diagnosis not present

## 2019-04-27 DIAGNOSIS — Z992 Dependence on renal dialysis: Secondary | ICD-10-CM | POA: Diagnosis not present

## 2019-04-27 DIAGNOSIS — N250 Renal osteodystrophy: Secondary | ICD-10-CM | POA: Diagnosis not present

## 2019-04-30 DIAGNOSIS — D631 Anemia in chronic kidney disease: Secondary | ICD-10-CM | POA: Diagnosis not present

## 2019-04-30 DIAGNOSIS — N250 Renal osteodystrophy: Secondary | ICD-10-CM | POA: Diagnosis not present

## 2019-04-30 DIAGNOSIS — N186 End stage renal disease: Secondary | ICD-10-CM | POA: Diagnosis not present

## 2019-04-30 DIAGNOSIS — Z992 Dependence on renal dialysis: Secondary | ICD-10-CM | POA: Diagnosis not present

## 2019-04-30 DIAGNOSIS — D509 Iron deficiency anemia, unspecified: Secondary | ICD-10-CM | POA: Diagnosis not present

## 2019-04-30 DIAGNOSIS — Z23 Encounter for immunization: Secondary | ICD-10-CM | POA: Diagnosis not present

## 2019-05-01 DIAGNOSIS — I10 Essential (primary) hypertension: Secondary | ICD-10-CM | POA: Diagnosis not present

## 2019-05-01 DIAGNOSIS — E7800 Pure hypercholesterolemia, unspecified: Secondary | ICD-10-CM | POA: Diagnosis not present

## 2019-05-02 DIAGNOSIS — Z23 Encounter for immunization: Secondary | ICD-10-CM | POA: Diagnosis not present

## 2019-05-02 DIAGNOSIS — N250 Renal osteodystrophy: Secondary | ICD-10-CM | POA: Diagnosis not present

## 2019-05-02 DIAGNOSIS — D509 Iron deficiency anemia, unspecified: Secondary | ICD-10-CM | POA: Diagnosis not present

## 2019-05-02 DIAGNOSIS — N186 End stage renal disease: Secondary | ICD-10-CM | POA: Diagnosis not present

## 2019-05-02 DIAGNOSIS — Z992 Dependence on renal dialysis: Secondary | ICD-10-CM | POA: Diagnosis not present

## 2019-05-02 DIAGNOSIS — D631 Anemia in chronic kidney disease: Secondary | ICD-10-CM | POA: Diagnosis not present

## 2019-05-03 DIAGNOSIS — I509 Heart failure, unspecified: Secondary | ICD-10-CM | POA: Diagnosis not present

## 2019-05-03 DIAGNOSIS — Z48815 Encounter for surgical aftercare following surgery on the digestive system: Secondary | ICD-10-CM | POA: Diagnosis not present

## 2019-05-03 DIAGNOSIS — I25811 Atherosclerosis of native coronary artery of transplanted heart without angina pectoris: Secondary | ICD-10-CM | POA: Diagnosis not present

## 2019-05-03 DIAGNOSIS — I132 Hypertensive heart and chronic kidney disease with heart failure and with stage 5 chronic kidney disease, or end stage renal disease: Secondary | ICD-10-CM | POA: Diagnosis not present

## 2019-05-03 DIAGNOSIS — N186 End stage renal disease: Secondary | ICD-10-CM | POA: Diagnosis not present

## 2019-05-03 DIAGNOSIS — D631 Anemia in chronic kidney disease: Secondary | ICD-10-CM | POA: Diagnosis not present

## 2019-05-04 DIAGNOSIS — D631 Anemia in chronic kidney disease: Secondary | ICD-10-CM | POA: Diagnosis not present

## 2019-05-04 DIAGNOSIS — D509 Iron deficiency anemia, unspecified: Secondary | ICD-10-CM | POA: Diagnosis not present

## 2019-05-04 DIAGNOSIS — N186 End stage renal disease: Secondary | ICD-10-CM | POA: Diagnosis not present

## 2019-05-04 DIAGNOSIS — Z23 Encounter for immunization: Secondary | ICD-10-CM | POA: Diagnosis not present

## 2019-05-04 DIAGNOSIS — N250 Renal osteodystrophy: Secondary | ICD-10-CM | POA: Diagnosis not present

## 2019-05-04 DIAGNOSIS — Z992 Dependence on renal dialysis: Secondary | ICD-10-CM | POA: Diagnosis not present

## 2019-05-07 DIAGNOSIS — N186 End stage renal disease: Secondary | ICD-10-CM | POA: Diagnosis not present

## 2019-05-07 DIAGNOSIS — Z23 Encounter for immunization: Secondary | ICD-10-CM | POA: Diagnosis not present

## 2019-05-07 DIAGNOSIS — Z992 Dependence on renal dialysis: Secondary | ICD-10-CM | POA: Diagnosis not present

## 2019-05-07 DIAGNOSIS — N250 Renal osteodystrophy: Secondary | ICD-10-CM | POA: Diagnosis not present

## 2019-05-07 DIAGNOSIS — D509 Iron deficiency anemia, unspecified: Secondary | ICD-10-CM | POA: Diagnosis not present

## 2019-05-07 DIAGNOSIS — D631 Anemia in chronic kidney disease: Secondary | ICD-10-CM | POA: Diagnosis not present

## 2019-05-08 DIAGNOSIS — N186 End stage renal disease: Secondary | ICD-10-CM | POA: Diagnosis not present

## 2019-05-08 DIAGNOSIS — Z992 Dependence on renal dialysis: Secondary | ICD-10-CM | POA: Diagnosis not present

## 2019-05-09 DIAGNOSIS — D509 Iron deficiency anemia, unspecified: Secondary | ICD-10-CM | POA: Diagnosis not present

## 2019-05-09 DIAGNOSIS — Z23 Encounter for immunization: Secondary | ICD-10-CM | POA: Diagnosis not present

## 2019-05-09 DIAGNOSIS — N250 Renal osteodystrophy: Secondary | ICD-10-CM | POA: Diagnosis not present

## 2019-05-09 DIAGNOSIS — D631 Anemia in chronic kidney disease: Secondary | ICD-10-CM | POA: Diagnosis not present

## 2019-05-09 DIAGNOSIS — Z992 Dependence on renal dialysis: Secondary | ICD-10-CM | POA: Diagnosis not present

## 2019-05-09 DIAGNOSIS — N186 End stage renal disease: Secondary | ICD-10-CM | POA: Diagnosis not present

## 2019-05-10 DIAGNOSIS — I509 Heart failure, unspecified: Secondary | ICD-10-CM | POA: Diagnosis not present

## 2019-05-10 DIAGNOSIS — D631 Anemia in chronic kidney disease: Secondary | ICD-10-CM | POA: Diagnosis not present

## 2019-05-10 DIAGNOSIS — Z48815 Encounter for surgical aftercare following surgery on the digestive system: Secondary | ICD-10-CM | POA: Diagnosis not present

## 2019-05-10 DIAGNOSIS — I25811 Atherosclerosis of native coronary artery of transplanted heart without angina pectoris: Secondary | ICD-10-CM | POA: Diagnosis not present

## 2019-05-10 DIAGNOSIS — I132 Hypertensive heart and chronic kidney disease with heart failure and with stage 5 chronic kidney disease, or end stage renal disease: Secondary | ICD-10-CM | POA: Diagnosis not present

## 2019-05-10 DIAGNOSIS — N186 End stage renal disease: Secondary | ICD-10-CM | POA: Diagnosis not present

## 2019-05-11 DIAGNOSIS — Z992 Dependence on renal dialysis: Secondary | ICD-10-CM | POA: Diagnosis not present

## 2019-05-11 DIAGNOSIS — D509 Iron deficiency anemia, unspecified: Secondary | ICD-10-CM | POA: Diagnosis not present

## 2019-05-11 DIAGNOSIS — Z23 Encounter for immunization: Secondary | ICD-10-CM | POA: Diagnosis not present

## 2019-05-11 DIAGNOSIS — N250 Renal osteodystrophy: Secondary | ICD-10-CM | POA: Diagnosis not present

## 2019-05-11 DIAGNOSIS — D631 Anemia in chronic kidney disease: Secondary | ICD-10-CM | POA: Diagnosis not present

## 2019-05-11 DIAGNOSIS — N186 End stage renal disease: Secondary | ICD-10-CM | POA: Diagnosis not present

## 2019-05-14 DIAGNOSIS — N186 End stage renal disease: Secondary | ICD-10-CM | POA: Diagnosis not present

## 2019-05-14 DIAGNOSIS — Z992 Dependence on renal dialysis: Secondary | ICD-10-CM | POA: Diagnosis not present

## 2019-05-14 DIAGNOSIS — D631 Anemia in chronic kidney disease: Secondary | ICD-10-CM | POA: Diagnosis not present

## 2019-05-14 DIAGNOSIS — N250 Renal osteodystrophy: Secondary | ICD-10-CM | POA: Diagnosis not present

## 2019-05-14 DIAGNOSIS — Z23 Encounter for immunization: Secondary | ICD-10-CM | POA: Diagnosis not present

## 2019-05-14 DIAGNOSIS — D509 Iron deficiency anemia, unspecified: Secondary | ICD-10-CM | POA: Diagnosis not present

## 2019-05-15 DIAGNOSIS — K56600 Partial intestinal obstruction, unspecified as to cause: Secondary | ICD-10-CM | POA: Diagnosis not present

## 2019-05-15 DIAGNOSIS — Z6820 Body mass index (BMI) 20.0-20.9, adult: Secondary | ICD-10-CM | POA: Diagnosis not present

## 2019-05-15 DIAGNOSIS — Z941 Heart transplant status: Secondary | ICD-10-CM | POA: Diagnosis not present

## 2019-05-15 DIAGNOSIS — I10 Essential (primary) hypertension: Secondary | ICD-10-CM | POA: Diagnosis not present

## 2019-05-15 DIAGNOSIS — K219 Gastro-esophageal reflux disease without esophagitis: Secondary | ICD-10-CM | POA: Diagnosis not present

## 2019-05-15 DIAGNOSIS — Z299 Encounter for prophylactic measures, unspecified: Secondary | ICD-10-CM | POA: Diagnosis not present

## 2019-05-16 DIAGNOSIS — I429 Cardiomyopathy, unspecified: Secondary | ICD-10-CM | POA: Diagnosis not present

## 2019-05-16 DIAGNOSIS — Z85048 Personal history of other malignant neoplasm of rectum, rectosigmoid junction, and anus: Secondary | ICD-10-CM | POA: Diagnosis not present

## 2019-05-16 DIAGNOSIS — Z87891 Personal history of nicotine dependence: Secondary | ICD-10-CM | POA: Diagnosis not present

## 2019-05-16 DIAGNOSIS — Z992 Dependence on renal dialysis: Secondary | ICD-10-CM | POA: Diagnosis not present

## 2019-05-16 DIAGNOSIS — N250 Renal osteodystrophy: Secondary | ICD-10-CM | POA: Diagnosis not present

## 2019-05-16 DIAGNOSIS — I509 Heart failure, unspecified: Secondary | ICD-10-CM | POA: Diagnosis not present

## 2019-05-16 DIAGNOSIS — D509 Iron deficiency anemia, unspecified: Secondary | ICD-10-CM | POA: Diagnosis not present

## 2019-05-16 DIAGNOSIS — I132 Hypertensive heart and chronic kidney disease with heart failure and with stage 5 chronic kidney disease, or end stage renal disease: Secondary | ICD-10-CM | POA: Diagnosis not present

## 2019-05-16 DIAGNOSIS — Z950 Presence of cardiac pacemaker: Secondary | ICD-10-CM | POA: Diagnosis not present

## 2019-05-16 DIAGNOSIS — N186 End stage renal disease: Secondary | ICD-10-CM | POA: Diagnosis not present

## 2019-05-16 DIAGNOSIS — Z48815 Encounter for surgical aftercare following surgery on the digestive system: Secondary | ICD-10-CM | POA: Diagnosis not present

## 2019-05-16 DIAGNOSIS — Z23 Encounter for immunization: Secondary | ICD-10-CM | POA: Diagnosis not present

## 2019-05-16 DIAGNOSIS — Z941 Heart transplant status: Secondary | ICD-10-CM | POA: Diagnosis not present

## 2019-05-16 DIAGNOSIS — Z952 Presence of prosthetic heart valve: Secondary | ICD-10-CM | POA: Diagnosis not present

## 2019-05-16 DIAGNOSIS — D631 Anemia in chronic kidney disease: Secondary | ICD-10-CM | POA: Diagnosis not present

## 2019-05-16 DIAGNOSIS — I25811 Atherosclerosis of native coronary artery of transplanted heart without angina pectoris: Secondary | ICD-10-CM | POA: Diagnosis not present

## 2019-05-17 DIAGNOSIS — I25811 Atherosclerosis of native coronary artery of transplanted heart without angina pectoris: Secondary | ICD-10-CM | POA: Diagnosis not present

## 2019-05-17 DIAGNOSIS — Z48815 Encounter for surgical aftercare following surgery on the digestive system: Secondary | ICD-10-CM | POA: Diagnosis not present

## 2019-05-17 DIAGNOSIS — I132 Hypertensive heart and chronic kidney disease with heart failure and with stage 5 chronic kidney disease, or end stage renal disease: Secondary | ICD-10-CM | POA: Diagnosis not present

## 2019-05-17 DIAGNOSIS — N186 End stage renal disease: Secondary | ICD-10-CM | POA: Diagnosis not present

## 2019-05-17 DIAGNOSIS — D631 Anemia in chronic kidney disease: Secondary | ICD-10-CM | POA: Diagnosis not present

## 2019-05-17 DIAGNOSIS — I10 Essential (primary) hypertension: Secondary | ICD-10-CM | POA: Diagnosis not present

## 2019-05-17 DIAGNOSIS — I509 Heart failure, unspecified: Secondary | ICD-10-CM | POA: Diagnosis not present

## 2019-05-17 DIAGNOSIS — E7800 Pure hypercholesterolemia, unspecified: Secondary | ICD-10-CM | POA: Diagnosis not present

## 2019-05-18 DIAGNOSIS — N186 End stage renal disease: Secondary | ICD-10-CM | POA: Diagnosis not present

## 2019-05-18 DIAGNOSIS — D509 Iron deficiency anemia, unspecified: Secondary | ICD-10-CM | POA: Diagnosis not present

## 2019-05-18 DIAGNOSIS — N250 Renal osteodystrophy: Secondary | ICD-10-CM | POA: Diagnosis not present

## 2019-05-18 DIAGNOSIS — Z23 Encounter for immunization: Secondary | ICD-10-CM | POA: Diagnosis not present

## 2019-05-18 DIAGNOSIS — D631 Anemia in chronic kidney disease: Secondary | ICD-10-CM | POA: Diagnosis not present

## 2019-05-18 DIAGNOSIS — Z992 Dependence on renal dialysis: Secondary | ICD-10-CM | POA: Diagnosis not present

## 2019-05-21 DIAGNOSIS — N186 End stage renal disease: Secondary | ICD-10-CM | POA: Diagnosis not present

## 2019-05-21 DIAGNOSIS — Z23 Encounter for immunization: Secondary | ICD-10-CM | POA: Diagnosis not present

## 2019-05-21 DIAGNOSIS — D631 Anemia in chronic kidney disease: Secondary | ICD-10-CM | POA: Diagnosis not present

## 2019-05-21 DIAGNOSIS — D509 Iron deficiency anemia, unspecified: Secondary | ICD-10-CM | POA: Diagnosis not present

## 2019-05-21 DIAGNOSIS — N250 Renal osteodystrophy: Secondary | ICD-10-CM | POA: Diagnosis not present

## 2019-05-21 DIAGNOSIS — Z992 Dependence on renal dialysis: Secondary | ICD-10-CM | POA: Diagnosis not present

## 2019-05-22 DIAGNOSIS — K56609 Unspecified intestinal obstruction, unspecified as to partial versus complete obstruction: Secondary | ICD-10-CM | POA: Diagnosis not present

## 2019-05-22 DIAGNOSIS — R1030 Lower abdominal pain, unspecified: Secondary | ICD-10-CM | POA: Diagnosis not present

## 2019-05-22 DIAGNOSIS — K529 Noninfective gastroenteritis and colitis, unspecified: Secondary | ICD-10-CM | POA: Diagnosis not present

## 2019-05-23 DIAGNOSIS — D631 Anemia in chronic kidney disease: Secondary | ICD-10-CM | POA: Diagnosis not present

## 2019-05-23 DIAGNOSIS — D509 Iron deficiency anemia, unspecified: Secondary | ICD-10-CM | POA: Diagnosis not present

## 2019-05-23 DIAGNOSIS — Z23 Encounter for immunization: Secondary | ICD-10-CM | POA: Diagnosis not present

## 2019-05-23 DIAGNOSIS — N250 Renal osteodystrophy: Secondary | ICD-10-CM | POA: Diagnosis not present

## 2019-05-23 DIAGNOSIS — Z992 Dependence on renal dialysis: Secondary | ICD-10-CM | POA: Diagnosis not present

## 2019-05-23 DIAGNOSIS — N186 End stage renal disease: Secondary | ICD-10-CM | POA: Diagnosis not present

## 2019-05-24 DIAGNOSIS — N186 End stage renal disease: Secondary | ICD-10-CM | POA: Diagnosis not present

## 2019-05-24 DIAGNOSIS — I509 Heart failure, unspecified: Secondary | ICD-10-CM | POA: Diagnosis not present

## 2019-05-24 DIAGNOSIS — D631 Anemia in chronic kidney disease: Secondary | ICD-10-CM | POA: Diagnosis not present

## 2019-05-24 DIAGNOSIS — I132 Hypertensive heart and chronic kidney disease with heart failure and with stage 5 chronic kidney disease, or end stage renal disease: Secondary | ICD-10-CM | POA: Diagnosis not present

## 2019-05-24 DIAGNOSIS — Z48815 Encounter for surgical aftercare following surgery on the digestive system: Secondary | ICD-10-CM | POA: Diagnosis not present

## 2019-05-24 DIAGNOSIS — I25811 Atherosclerosis of native coronary artery of transplanted heart without angina pectoris: Secondary | ICD-10-CM | POA: Diagnosis not present

## 2019-05-25 DIAGNOSIS — D509 Iron deficiency anemia, unspecified: Secondary | ICD-10-CM | POA: Diagnosis not present

## 2019-05-25 DIAGNOSIS — N250 Renal osteodystrophy: Secondary | ICD-10-CM | POA: Diagnosis not present

## 2019-05-25 DIAGNOSIS — Z992 Dependence on renal dialysis: Secondary | ICD-10-CM | POA: Diagnosis not present

## 2019-05-25 DIAGNOSIS — N186 End stage renal disease: Secondary | ICD-10-CM | POA: Diagnosis not present

## 2019-05-25 DIAGNOSIS — Z23 Encounter for immunization: Secondary | ICD-10-CM | POA: Diagnosis not present

## 2019-05-25 DIAGNOSIS — D631 Anemia in chronic kidney disease: Secondary | ICD-10-CM | POA: Diagnosis not present

## 2019-05-28 DIAGNOSIS — N250 Renal osteodystrophy: Secondary | ICD-10-CM | POA: Diagnosis not present

## 2019-05-28 DIAGNOSIS — D631 Anemia in chronic kidney disease: Secondary | ICD-10-CM | POA: Diagnosis not present

## 2019-05-28 DIAGNOSIS — N186 End stage renal disease: Secondary | ICD-10-CM | POA: Diagnosis not present

## 2019-05-28 DIAGNOSIS — Z23 Encounter for immunization: Secondary | ICD-10-CM | POA: Diagnosis not present

## 2019-05-28 DIAGNOSIS — D509 Iron deficiency anemia, unspecified: Secondary | ICD-10-CM | POA: Diagnosis not present

## 2019-05-28 DIAGNOSIS — Z992 Dependence on renal dialysis: Secondary | ICD-10-CM | POA: Diagnosis not present

## 2019-05-29 DIAGNOSIS — Z85048 Personal history of other malignant neoplasm of rectum, rectosigmoid junction, and anus: Secondary | ICD-10-CM | POA: Diagnosis not present

## 2019-05-29 DIAGNOSIS — Z08 Encounter for follow-up examination after completed treatment for malignant neoplasm: Secondary | ICD-10-CM | POA: Diagnosis not present

## 2019-05-29 DIAGNOSIS — K644 Residual hemorrhoidal skin tags: Secondary | ICD-10-CM | POA: Diagnosis not present

## 2019-05-30 DIAGNOSIS — Z992 Dependence on renal dialysis: Secondary | ICD-10-CM | POA: Diagnosis not present

## 2019-05-30 DIAGNOSIS — D509 Iron deficiency anemia, unspecified: Secondary | ICD-10-CM | POA: Diagnosis not present

## 2019-05-30 DIAGNOSIS — Z23 Encounter for immunization: Secondary | ICD-10-CM | POA: Diagnosis not present

## 2019-05-30 DIAGNOSIS — N186 End stage renal disease: Secondary | ICD-10-CM | POA: Diagnosis not present

## 2019-05-30 DIAGNOSIS — N250 Renal osteodystrophy: Secondary | ICD-10-CM | POA: Diagnosis not present

## 2019-05-30 DIAGNOSIS — D631 Anemia in chronic kidney disease: Secondary | ICD-10-CM | POA: Diagnosis not present

## 2019-05-31 DIAGNOSIS — I132 Hypertensive heart and chronic kidney disease with heart failure and with stage 5 chronic kidney disease, or end stage renal disease: Secondary | ICD-10-CM | POA: Diagnosis not present

## 2019-05-31 DIAGNOSIS — D631 Anemia in chronic kidney disease: Secondary | ICD-10-CM | POA: Diagnosis not present

## 2019-05-31 DIAGNOSIS — N186 End stage renal disease: Secondary | ICD-10-CM | POA: Diagnosis not present

## 2019-05-31 DIAGNOSIS — Z48815 Encounter for surgical aftercare following surgery on the digestive system: Secondary | ICD-10-CM | POA: Diagnosis not present

## 2019-05-31 DIAGNOSIS — I25811 Atherosclerosis of native coronary artery of transplanted heart without angina pectoris: Secondary | ICD-10-CM | POA: Diagnosis not present

## 2019-05-31 DIAGNOSIS — I509 Heart failure, unspecified: Secondary | ICD-10-CM | POA: Diagnosis not present

## 2019-06-01 DIAGNOSIS — R1010 Upper abdominal pain, unspecified: Secondary | ICD-10-CM | POA: Diagnosis not present

## 2019-06-01 DIAGNOSIS — D631 Anemia in chronic kidney disease: Secondary | ICD-10-CM | POA: Diagnosis not present

## 2019-06-01 DIAGNOSIS — Z992 Dependence on renal dialysis: Secondary | ICD-10-CM | POA: Diagnosis not present

## 2019-06-01 DIAGNOSIS — D509 Iron deficiency anemia, unspecified: Secondary | ICD-10-CM | POA: Diagnosis not present

## 2019-06-01 DIAGNOSIS — Z23 Encounter for immunization: Secondary | ICD-10-CM | POA: Diagnosis not present

## 2019-06-01 DIAGNOSIS — N250 Renal osteodystrophy: Secondary | ICD-10-CM | POA: Diagnosis not present

## 2019-06-01 DIAGNOSIS — N186 End stage renal disease: Secondary | ICD-10-CM | POA: Diagnosis not present

## 2019-06-04 DIAGNOSIS — Z992 Dependence on renal dialysis: Secondary | ICD-10-CM | POA: Diagnosis not present

## 2019-06-04 DIAGNOSIS — D631 Anemia in chronic kidney disease: Secondary | ICD-10-CM | POA: Diagnosis not present

## 2019-06-04 DIAGNOSIS — Z23 Encounter for immunization: Secondary | ICD-10-CM | POA: Diagnosis not present

## 2019-06-04 DIAGNOSIS — N250 Renal osteodystrophy: Secondary | ICD-10-CM | POA: Diagnosis not present

## 2019-06-04 DIAGNOSIS — N186 End stage renal disease: Secondary | ICD-10-CM | POA: Diagnosis not present

## 2019-06-04 DIAGNOSIS — D509 Iron deficiency anemia, unspecified: Secondary | ICD-10-CM | POA: Diagnosis not present

## 2019-06-06 DIAGNOSIS — Z992 Dependence on renal dialysis: Secondary | ICD-10-CM | POA: Diagnosis not present

## 2019-06-06 DIAGNOSIS — N250 Renal osteodystrophy: Secondary | ICD-10-CM | POA: Diagnosis not present

## 2019-06-06 DIAGNOSIS — D631 Anemia in chronic kidney disease: Secondary | ICD-10-CM | POA: Diagnosis not present

## 2019-06-06 DIAGNOSIS — Z23 Encounter for immunization: Secondary | ICD-10-CM | POA: Diagnosis not present

## 2019-06-06 DIAGNOSIS — N186 End stage renal disease: Secondary | ICD-10-CM | POA: Diagnosis not present

## 2019-06-06 DIAGNOSIS — D509 Iron deficiency anemia, unspecified: Secondary | ICD-10-CM | POA: Diagnosis not present

## 2019-06-07 DIAGNOSIS — D631 Anemia in chronic kidney disease: Secondary | ICD-10-CM | POA: Diagnosis not present

## 2019-06-07 DIAGNOSIS — I509 Heart failure, unspecified: Secondary | ICD-10-CM | POA: Diagnosis not present

## 2019-06-07 DIAGNOSIS — Z48815 Encounter for surgical aftercare following surgery on the digestive system: Secondary | ICD-10-CM | POA: Diagnosis not present

## 2019-06-07 DIAGNOSIS — I25811 Atherosclerosis of native coronary artery of transplanted heart without angina pectoris: Secondary | ICD-10-CM | POA: Diagnosis not present

## 2019-06-07 DIAGNOSIS — N186 End stage renal disease: Secondary | ICD-10-CM | POA: Diagnosis not present

## 2019-06-07 DIAGNOSIS — I132 Hypertensive heart and chronic kidney disease with heart failure and with stage 5 chronic kidney disease, or end stage renal disease: Secondary | ICD-10-CM | POA: Diagnosis not present

## 2019-06-08 DIAGNOSIS — D509 Iron deficiency anemia, unspecified: Secondary | ICD-10-CM | POA: Diagnosis not present

## 2019-06-08 DIAGNOSIS — Z992 Dependence on renal dialysis: Secondary | ICD-10-CM | POA: Diagnosis not present

## 2019-06-08 DIAGNOSIS — Z23 Encounter for immunization: Secondary | ICD-10-CM | POA: Diagnosis not present

## 2019-06-08 DIAGNOSIS — N186 End stage renal disease: Secondary | ICD-10-CM | POA: Diagnosis not present

## 2019-06-08 DIAGNOSIS — D631 Anemia in chronic kidney disease: Secondary | ICD-10-CM | POA: Diagnosis not present

## 2019-06-08 DIAGNOSIS — N250 Renal osteodystrophy: Secondary | ICD-10-CM | POA: Diagnosis not present

## 2019-06-11 DIAGNOSIS — Z992 Dependence on renal dialysis: Secondary | ICD-10-CM | POA: Diagnosis not present

## 2019-06-11 DIAGNOSIS — N186 End stage renal disease: Secondary | ICD-10-CM | POA: Diagnosis not present

## 2019-06-11 DIAGNOSIS — N250 Renal osteodystrophy: Secondary | ICD-10-CM | POA: Diagnosis not present

## 2019-06-11 DIAGNOSIS — R110 Nausea: Secondary | ICD-10-CM | POA: Diagnosis not present

## 2019-06-11 DIAGNOSIS — D631 Anemia in chronic kidney disease: Secondary | ICD-10-CM | POA: Diagnosis not present

## 2019-06-11 DIAGNOSIS — D509 Iron deficiency anemia, unspecified: Secondary | ICD-10-CM | POA: Diagnosis not present

## 2019-06-12 DIAGNOSIS — Z299 Encounter for prophylactic measures, unspecified: Secondary | ICD-10-CM | POA: Diagnosis not present

## 2019-06-12 DIAGNOSIS — N186 End stage renal disease: Secondary | ICD-10-CM | POA: Diagnosis not present

## 2019-06-12 DIAGNOSIS — D631 Anemia in chronic kidney disease: Secondary | ICD-10-CM | POA: Diagnosis not present

## 2019-06-12 DIAGNOSIS — N492 Inflammatory disorders of scrotum: Secondary | ICD-10-CM | POA: Diagnosis not present

## 2019-06-12 DIAGNOSIS — Z941 Heart transplant status: Secondary | ICD-10-CM | POA: Diagnosis not present

## 2019-06-12 DIAGNOSIS — Z681 Body mass index (BMI) 19 or less, adult: Secondary | ICD-10-CM | POA: Diagnosis not present

## 2019-06-12 DIAGNOSIS — I509 Heart failure, unspecified: Secondary | ICD-10-CM | POA: Diagnosis not present

## 2019-06-12 DIAGNOSIS — I10 Essential (primary) hypertension: Secondary | ICD-10-CM | POA: Diagnosis not present

## 2019-06-12 DIAGNOSIS — K56600 Partial intestinal obstruction, unspecified as to cause: Secondary | ICD-10-CM | POA: Diagnosis not present

## 2019-06-12 DIAGNOSIS — Z48815 Encounter for surgical aftercare following surgery on the digestive system: Secondary | ICD-10-CM | POA: Diagnosis not present

## 2019-06-12 DIAGNOSIS — I25811 Atherosclerosis of native coronary artery of transplanted heart without angina pectoris: Secondary | ICD-10-CM | POA: Diagnosis not present

## 2019-06-12 DIAGNOSIS — I132 Hypertensive heart and chronic kidney disease with heart failure and with stage 5 chronic kidney disease, or end stage renal disease: Secondary | ICD-10-CM | POA: Diagnosis not present

## 2019-06-13 DIAGNOSIS — I132 Hypertensive heart and chronic kidney disease with heart failure and with stage 5 chronic kidney disease, or end stage renal disease: Secondary | ICD-10-CM | POA: Diagnosis not present

## 2019-06-13 DIAGNOSIS — N186 End stage renal disease: Secondary | ICD-10-CM | POA: Diagnosis not present

## 2019-06-13 DIAGNOSIS — I509 Heart failure, unspecified: Secondary | ICD-10-CM | POA: Diagnosis not present

## 2019-06-14 DIAGNOSIS — N186 End stage renal disease: Secondary | ICD-10-CM | POA: Diagnosis not present

## 2019-06-14 DIAGNOSIS — D509 Iron deficiency anemia, unspecified: Secondary | ICD-10-CM | POA: Diagnosis not present

## 2019-06-14 DIAGNOSIS — E7800 Pure hypercholesterolemia, unspecified: Secondary | ICD-10-CM | POA: Diagnosis not present

## 2019-06-14 DIAGNOSIS — I10 Essential (primary) hypertension: Secondary | ICD-10-CM | POA: Diagnosis not present

## 2019-06-14 DIAGNOSIS — D631 Anemia in chronic kidney disease: Secondary | ICD-10-CM | POA: Diagnosis not present

## 2019-06-14 DIAGNOSIS — R110 Nausea: Secondary | ICD-10-CM | POA: Diagnosis not present

## 2019-06-14 DIAGNOSIS — Z992 Dependence on renal dialysis: Secondary | ICD-10-CM | POA: Diagnosis not present

## 2019-06-14 DIAGNOSIS — N250 Renal osteodystrophy: Secondary | ICD-10-CM | POA: Diagnosis not present

## 2019-06-16 DIAGNOSIS — N186 End stage renal disease: Secondary | ICD-10-CM | POA: Diagnosis not present

## 2019-06-16 DIAGNOSIS — D509 Iron deficiency anemia, unspecified: Secondary | ICD-10-CM | POA: Diagnosis not present

## 2019-06-16 DIAGNOSIS — N250 Renal osteodystrophy: Secondary | ICD-10-CM | POA: Diagnosis not present

## 2019-06-16 DIAGNOSIS — R110 Nausea: Secondary | ICD-10-CM | POA: Diagnosis not present

## 2019-06-16 DIAGNOSIS — D631 Anemia in chronic kidney disease: Secondary | ICD-10-CM | POA: Diagnosis not present

## 2019-06-16 DIAGNOSIS — Z992 Dependence on renal dialysis: Secondary | ICD-10-CM | POA: Diagnosis not present

## 2019-06-19 DIAGNOSIS — N250 Renal osteodystrophy: Secondary | ICD-10-CM | POA: Diagnosis not present

## 2019-06-19 DIAGNOSIS — D631 Anemia in chronic kidney disease: Secondary | ICD-10-CM | POA: Diagnosis not present

## 2019-06-19 DIAGNOSIS — Z992 Dependence on renal dialysis: Secondary | ICD-10-CM | POA: Diagnosis not present

## 2019-06-19 DIAGNOSIS — R110 Nausea: Secondary | ICD-10-CM | POA: Diagnosis not present

## 2019-06-19 DIAGNOSIS — D509 Iron deficiency anemia, unspecified: Secondary | ICD-10-CM | POA: Diagnosis not present

## 2019-06-19 DIAGNOSIS — N186 End stage renal disease: Secondary | ICD-10-CM | POA: Diagnosis not present

## 2019-06-21 DIAGNOSIS — D631 Anemia in chronic kidney disease: Secondary | ICD-10-CM | POA: Diagnosis not present

## 2019-06-21 DIAGNOSIS — Z992 Dependence on renal dialysis: Secondary | ICD-10-CM | POA: Diagnosis not present

## 2019-06-21 DIAGNOSIS — R110 Nausea: Secondary | ICD-10-CM | POA: Diagnosis not present

## 2019-06-21 DIAGNOSIS — N186 End stage renal disease: Secondary | ICD-10-CM | POA: Diagnosis not present

## 2019-06-21 DIAGNOSIS — N250 Renal osteodystrophy: Secondary | ICD-10-CM | POA: Diagnosis not present

## 2019-06-21 DIAGNOSIS — D509 Iron deficiency anemia, unspecified: Secondary | ICD-10-CM | POA: Diagnosis not present

## 2019-06-23 DIAGNOSIS — Z992 Dependence on renal dialysis: Secondary | ICD-10-CM | POA: Diagnosis not present

## 2019-06-23 DIAGNOSIS — D631 Anemia in chronic kidney disease: Secondary | ICD-10-CM | POA: Diagnosis not present

## 2019-06-23 DIAGNOSIS — N186 End stage renal disease: Secondary | ICD-10-CM | POA: Diagnosis not present

## 2019-06-23 DIAGNOSIS — R110 Nausea: Secondary | ICD-10-CM | POA: Diagnosis not present

## 2019-06-23 DIAGNOSIS — D509 Iron deficiency anemia, unspecified: Secondary | ICD-10-CM | POA: Diagnosis not present

## 2019-06-23 DIAGNOSIS — N250 Renal osteodystrophy: Secondary | ICD-10-CM | POA: Diagnosis not present

## 2019-06-26 DIAGNOSIS — N186 End stage renal disease: Secondary | ICD-10-CM | POA: Diagnosis not present

## 2019-06-26 DIAGNOSIS — Z992 Dependence on renal dialysis: Secondary | ICD-10-CM | POA: Diagnosis not present

## 2019-06-26 DIAGNOSIS — D509 Iron deficiency anemia, unspecified: Secondary | ICD-10-CM | POA: Diagnosis not present

## 2019-06-26 DIAGNOSIS — R110 Nausea: Secondary | ICD-10-CM | POA: Diagnosis not present

## 2019-06-26 DIAGNOSIS — N250 Renal osteodystrophy: Secondary | ICD-10-CM | POA: Diagnosis not present

## 2019-06-26 DIAGNOSIS — D631 Anemia in chronic kidney disease: Secondary | ICD-10-CM | POA: Diagnosis not present

## 2019-06-27 DIAGNOSIS — Z681 Body mass index (BMI) 19 or less, adult: Secondary | ICD-10-CM | POA: Diagnosis not present

## 2019-06-27 DIAGNOSIS — Z7189 Other specified counseling: Secondary | ICD-10-CM | POA: Diagnosis not present

## 2019-06-27 DIAGNOSIS — Z1211 Encounter for screening for malignant neoplasm of colon: Secondary | ICD-10-CM | POA: Diagnosis not present

## 2019-06-27 DIAGNOSIS — Z1339 Encounter for screening examination for other mental health and behavioral disorders: Secondary | ICD-10-CM | POA: Diagnosis not present

## 2019-06-27 DIAGNOSIS — R5383 Other fatigue: Secondary | ICD-10-CM | POA: Diagnosis not present

## 2019-06-27 DIAGNOSIS — E7800 Pure hypercholesterolemia, unspecified: Secondary | ICD-10-CM | POA: Diagnosis not present

## 2019-06-27 DIAGNOSIS — F419 Anxiety disorder, unspecified: Secondary | ICD-10-CM | POA: Diagnosis not present

## 2019-06-27 DIAGNOSIS — Z0000 Encounter for general adult medical examination without abnormal findings: Secondary | ICD-10-CM | POA: Diagnosis not present

## 2019-06-27 DIAGNOSIS — Z299 Encounter for prophylactic measures, unspecified: Secondary | ICD-10-CM | POA: Diagnosis not present

## 2019-06-27 DIAGNOSIS — Z1331 Encounter for screening for depression: Secondary | ICD-10-CM | POA: Diagnosis not present

## 2019-06-27 DIAGNOSIS — Z79899 Other long term (current) drug therapy: Secondary | ICD-10-CM | POA: Diagnosis not present

## 2019-06-28 DIAGNOSIS — D631 Anemia in chronic kidney disease: Secondary | ICD-10-CM | POA: Diagnosis not present

## 2019-06-28 DIAGNOSIS — Z992 Dependence on renal dialysis: Secondary | ICD-10-CM | POA: Diagnosis not present

## 2019-06-28 DIAGNOSIS — N250 Renal osteodystrophy: Secondary | ICD-10-CM | POA: Diagnosis not present

## 2019-06-28 DIAGNOSIS — R110 Nausea: Secondary | ICD-10-CM | POA: Diagnosis not present

## 2019-06-28 DIAGNOSIS — D509 Iron deficiency anemia, unspecified: Secondary | ICD-10-CM | POA: Diagnosis not present

## 2019-06-28 DIAGNOSIS — N186 End stage renal disease: Secondary | ICD-10-CM | POA: Diagnosis not present

## 2019-06-30 DIAGNOSIS — N186 End stage renal disease: Secondary | ICD-10-CM | POA: Diagnosis not present

## 2019-06-30 DIAGNOSIS — R110 Nausea: Secondary | ICD-10-CM | POA: Diagnosis not present

## 2019-06-30 DIAGNOSIS — D509 Iron deficiency anemia, unspecified: Secondary | ICD-10-CM | POA: Diagnosis not present

## 2019-06-30 DIAGNOSIS — Z992 Dependence on renal dialysis: Secondary | ICD-10-CM | POA: Diagnosis not present

## 2019-06-30 DIAGNOSIS — N250 Renal osteodystrophy: Secondary | ICD-10-CM | POA: Diagnosis not present

## 2019-06-30 DIAGNOSIS — D631 Anemia in chronic kidney disease: Secondary | ICD-10-CM | POA: Diagnosis not present

## 2019-07-03 DIAGNOSIS — D509 Iron deficiency anemia, unspecified: Secondary | ICD-10-CM | POA: Diagnosis not present

## 2019-07-03 DIAGNOSIS — Z992 Dependence on renal dialysis: Secondary | ICD-10-CM | POA: Diagnosis not present

## 2019-07-03 DIAGNOSIS — N186 End stage renal disease: Secondary | ICD-10-CM | POA: Diagnosis not present

## 2019-07-03 DIAGNOSIS — R110 Nausea: Secondary | ICD-10-CM | POA: Diagnosis not present

## 2019-07-03 DIAGNOSIS — N250 Renal osteodystrophy: Secondary | ICD-10-CM | POA: Diagnosis not present

## 2019-07-03 DIAGNOSIS — D631 Anemia in chronic kidney disease: Secondary | ICD-10-CM | POA: Diagnosis not present

## 2019-07-05 DIAGNOSIS — I358 Other nonrheumatic aortic valve disorders: Secondary | ICD-10-CM | POA: Diagnosis not present

## 2019-07-05 DIAGNOSIS — Z79899 Other long term (current) drug therapy: Secondary | ICD-10-CM | POA: Diagnosis not present

## 2019-07-05 DIAGNOSIS — Z7982 Long term (current) use of aspirin: Secondary | ICD-10-CM | POA: Diagnosis not present

## 2019-07-05 DIAGNOSIS — F17210 Nicotine dependence, cigarettes, uncomplicated: Secondary | ICD-10-CM | POA: Diagnosis not present

## 2019-07-05 DIAGNOSIS — Z5181 Encounter for therapeutic drug level monitoring: Secondary | ICD-10-CM | POA: Diagnosis not present

## 2019-07-05 DIAGNOSIS — Z87891 Personal history of nicotine dependence: Secondary | ICD-10-CM | POA: Diagnosis not present

## 2019-07-05 DIAGNOSIS — Z950 Presence of cardiac pacemaker: Secondary | ICD-10-CM | POA: Diagnosis not present

## 2019-07-05 DIAGNOSIS — Z941 Heart transplant status: Secondary | ICD-10-CM | POA: Diagnosis not present

## 2019-07-05 DIAGNOSIS — Z992 Dependence on renal dialysis: Secondary | ICD-10-CM | POA: Diagnosis not present

## 2019-07-05 DIAGNOSIS — G8929 Other chronic pain: Secondary | ICD-10-CM | POA: Diagnosis not present

## 2019-07-05 DIAGNOSIS — Z7952 Long term (current) use of systemic steroids: Secondary | ICD-10-CM | POA: Diagnosis not present

## 2019-07-05 DIAGNOSIS — N186 End stage renal disease: Secondary | ICD-10-CM | POA: Diagnosis not present

## 2019-07-05 DIAGNOSIS — R109 Unspecified abdominal pain: Secondary | ICD-10-CM | POA: Diagnosis not present

## 2019-07-05 DIAGNOSIS — I25811 Atherosclerosis of native coronary artery of transplanted heart without angina pectoris: Secondary | ICD-10-CM | POA: Diagnosis not present

## 2019-07-05 DIAGNOSIS — Z953 Presence of xenogenic heart valve: Secondary | ICD-10-CM | POA: Diagnosis not present

## 2019-07-05 DIAGNOSIS — I442 Atrioventricular block, complete: Secondary | ICD-10-CM | POA: Diagnosis not present

## 2019-07-05 DIAGNOSIS — I120 Hypertensive chronic kidney disease with stage 5 chronic kidney disease or end stage renal disease: Secondary | ICD-10-CM | POA: Diagnosis not present

## 2019-07-06 DIAGNOSIS — N186 End stage renal disease: Secondary | ICD-10-CM | POA: Diagnosis not present

## 2019-07-06 DIAGNOSIS — R110 Nausea: Secondary | ICD-10-CM | POA: Diagnosis not present

## 2019-07-06 DIAGNOSIS — N250 Renal osteodystrophy: Secondary | ICD-10-CM | POA: Diagnosis not present

## 2019-07-06 DIAGNOSIS — Z992 Dependence on renal dialysis: Secondary | ICD-10-CM | POA: Diagnosis not present

## 2019-07-06 DIAGNOSIS — D631 Anemia in chronic kidney disease: Secondary | ICD-10-CM | POA: Diagnosis not present

## 2019-07-06 DIAGNOSIS — D509 Iron deficiency anemia, unspecified: Secondary | ICD-10-CM | POA: Diagnosis not present

## 2019-07-07 DIAGNOSIS — I442 Atrioventricular block, complete: Secondary | ICD-10-CM | POA: Insufficient documentation

## 2019-07-07 DIAGNOSIS — N250 Renal osteodystrophy: Secondary | ICD-10-CM | POA: Diagnosis not present

## 2019-07-07 DIAGNOSIS — D509 Iron deficiency anemia, unspecified: Secondary | ICD-10-CM | POA: Diagnosis not present

## 2019-07-07 DIAGNOSIS — R110 Nausea: Secondary | ICD-10-CM | POA: Diagnosis not present

## 2019-07-07 DIAGNOSIS — Z992 Dependence on renal dialysis: Secondary | ICD-10-CM | POA: Diagnosis not present

## 2019-07-07 DIAGNOSIS — Z950 Presence of cardiac pacemaker: Secondary | ICD-10-CM | POA: Insufficient documentation

## 2019-07-07 DIAGNOSIS — D631 Anemia in chronic kidney disease: Secondary | ICD-10-CM | POA: Diagnosis not present

## 2019-07-07 DIAGNOSIS — N186 End stage renal disease: Secondary | ICD-10-CM | POA: Diagnosis not present

## 2019-07-09 DIAGNOSIS — N186 End stage renal disease: Secondary | ICD-10-CM | POA: Diagnosis not present

## 2019-07-09 DIAGNOSIS — Z992 Dependence on renal dialysis: Secondary | ICD-10-CM | POA: Diagnosis not present

## 2019-07-10 DIAGNOSIS — N250 Renal osteodystrophy: Secondary | ICD-10-CM | POA: Diagnosis not present

## 2019-07-10 DIAGNOSIS — D631 Anemia in chronic kidney disease: Secondary | ICD-10-CM | POA: Diagnosis not present

## 2019-07-10 DIAGNOSIS — D509 Iron deficiency anemia, unspecified: Secondary | ICD-10-CM | POA: Diagnosis not present

## 2019-07-10 DIAGNOSIS — R110 Nausea: Secondary | ICD-10-CM | POA: Diagnosis not present

## 2019-07-10 DIAGNOSIS — N186 End stage renal disease: Secondary | ICD-10-CM | POA: Diagnosis not present

## 2019-07-10 DIAGNOSIS — Z992 Dependence on renal dialysis: Secondary | ICD-10-CM | POA: Diagnosis not present

## 2019-07-12 DIAGNOSIS — E7800 Pure hypercholesterolemia, unspecified: Secondary | ICD-10-CM | POA: Diagnosis not present

## 2019-07-12 DIAGNOSIS — D509 Iron deficiency anemia, unspecified: Secondary | ICD-10-CM | POA: Diagnosis not present

## 2019-07-12 DIAGNOSIS — R110 Nausea: Secondary | ICD-10-CM | POA: Diagnosis not present

## 2019-07-12 DIAGNOSIS — I10 Essential (primary) hypertension: Secondary | ICD-10-CM | POA: Diagnosis not present

## 2019-07-12 DIAGNOSIS — N250 Renal osteodystrophy: Secondary | ICD-10-CM | POA: Diagnosis not present

## 2019-07-12 DIAGNOSIS — Z992 Dependence on renal dialysis: Secondary | ICD-10-CM | POA: Diagnosis not present

## 2019-07-12 DIAGNOSIS — D631 Anemia in chronic kidney disease: Secondary | ICD-10-CM | POA: Diagnosis not present

## 2019-07-12 DIAGNOSIS — N186 End stage renal disease: Secondary | ICD-10-CM | POA: Diagnosis not present

## 2019-07-14 DIAGNOSIS — D509 Iron deficiency anemia, unspecified: Secondary | ICD-10-CM | POA: Diagnosis not present

## 2019-07-14 DIAGNOSIS — D631 Anemia in chronic kidney disease: Secondary | ICD-10-CM | POA: Diagnosis not present

## 2019-07-14 DIAGNOSIS — N250 Renal osteodystrophy: Secondary | ICD-10-CM | POA: Diagnosis not present

## 2019-07-14 DIAGNOSIS — Z992 Dependence on renal dialysis: Secondary | ICD-10-CM | POA: Diagnosis not present

## 2019-07-14 DIAGNOSIS — N186 End stage renal disease: Secondary | ICD-10-CM | POA: Diagnosis not present

## 2019-07-14 DIAGNOSIS — R110 Nausea: Secondary | ICD-10-CM | POA: Diagnosis not present

## 2019-07-17 DIAGNOSIS — Z992 Dependence on renal dialysis: Secondary | ICD-10-CM | POA: Diagnosis not present

## 2019-07-17 DIAGNOSIS — N250 Renal osteodystrophy: Secondary | ICD-10-CM | POA: Diagnosis not present

## 2019-07-17 DIAGNOSIS — D509 Iron deficiency anemia, unspecified: Secondary | ICD-10-CM | POA: Diagnosis not present

## 2019-07-17 DIAGNOSIS — R110 Nausea: Secondary | ICD-10-CM | POA: Diagnosis not present

## 2019-07-17 DIAGNOSIS — N186 End stage renal disease: Secondary | ICD-10-CM | POA: Diagnosis not present

## 2019-07-17 DIAGNOSIS — D631 Anemia in chronic kidney disease: Secondary | ICD-10-CM | POA: Diagnosis not present

## 2019-07-19 DIAGNOSIS — D631 Anemia in chronic kidney disease: Secondary | ICD-10-CM | POA: Diagnosis not present

## 2019-07-19 DIAGNOSIS — G629 Polyneuropathy, unspecified: Secondary | ICD-10-CM | POA: Diagnosis not present

## 2019-07-19 DIAGNOSIS — N185 Chronic kidney disease, stage 5: Secondary | ICD-10-CM | POA: Diagnosis not present

## 2019-07-19 DIAGNOSIS — R110 Nausea: Secondary | ICD-10-CM | POA: Diagnosis not present

## 2019-07-19 DIAGNOSIS — N250 Renal osteodystrophy: Secondary | ICD-10-CM | POA: Diagnosis not present

## 2019-07-19 DIAGNOSIS — F17210 Nicotine dependence, cigarettes, uncomplicated: Secondary | ICD-10-CM | POA: Diagnosis not present

## 2019-07-19 DIAGNOSIS — D509 Iron deficiency anemia, unspecified: Secondary | ICD-10-CM | POA: Diagnosis not present

## 2019-07-19 DIAGNOSIS — N186 End stage renal disease: Secondary | ICD-10-CM | POA: Diagnosis not present

## 2019-07-19 DIAGNOSIS — I10 Essential (primary) hypertension: Secondary | ICD-10-CM | POA: Diagnosis not present

## 2019-07-19 DIAGNOSIS — Z299 Encounter for prophylactic measures, unspecified: Secondary | ICD-10-CM | POA: Diagnosis not present

## 2019-07-19 DIAGNOSIS — Z992 Dependence on renal dialysis: Secondary | ICD-10-CM | POA: Diagnosis not present

## 2019-07-21 DIAGNOSIS — N186 End stage renal disease: Secondary | ICD-10-CM | POA: Diagnosis not present

## 2019-07-21 DIAGNOSIS — D509 Iron deficiency anemia, unspecified: Secondary | ICD-10-CM | POA: Diagnosis not present

## 2019-07-21 DIAGNOSIS — R110 Nausea: Secondary | ICD-10-CM | POA: Diagnosis not present

## 2019-07-21 DIAGNOSIS — D631 Anemia in chronic kidney disease: Secondary | ICD-10-CM | POA: Diagnosis not present

## 2019-07-21 DIAGNOSIS — Z992 Dependence on renal dialysis: Secondary | ICD-10-CM | POA: Diagnosis not present

## 2019-07-21 DIAGNOSIS — N250 Renal osteodystrophy: Secondary | ICD-10-CM | POA: Diagnosis not present

## 2019-07-23 DIAGNOSIS — N186 End stage renal disease: Secondary | ICD-10-CM | POA: Diagnosis not present

## 2019-07-23 DIAGNOSIS — Z992 Dependence on renal dialysis: Secondary | ICD-10-CM | POA: Diagnosis not present

## 2019-07-23 DIAGNOSIS — D631 Anemia in chronic kidney disease: Secondary | ICD-10-CM | POA: Diagnosis not present

## 2019-07-24 DIAGNOSIS — N186 End stage renal disease: Secondary | ICD-10-CM | POA: Diagnosis not present

## 2019-07-24 DIAGNOSIS — D509 Iron deficiency anemia, unspecified: Secondary | ICD-10-CM | POA: Diagnosis not present

## 2019-07-24 DIAGNOSIS — N250 Renal osteodystrophy: Secondary | ICD-10-CM | POA: Diagnosis not present

## 2019-07-24 DIAGNOSIS — Z992 Dependence on renal dialysis: Secondary | ICD-10-CM | POA: Diagnosis not present

## 2019-07-24 DIAGNOSIS — R110 Nausea: Secondary | ICD-10-CM | POA: Diagnosis not present

## 2019-07-24 DIAGNOSIS — D631 Anemia in chronic kidney disease: Secondary | ICD-10-CM | POA: Diagnosis not present

## 2019-07-26 DIAGNOSIS — D631 Anemia in chronic kidney disease: Secondary | ICD-10-CM | POA: Diagnosis not present

## 2019-07-26 DIAGNOSIS — R110 Nausea: Secondary | ICD-10-CM | POA: Diagnosis not present

## 2019-07-26 DIAGNOSIS — D509 Iron deficiency anemia, unspecified: Secondary | ICD-10-CM | POA: Diagnosis not present

## 2019-07-26 DIAGNOSIS — Z992 Dependence on renal dialysis: Secondary | ICD-10-CM | POA: Diagnosis not present

## 2019-07-26 DIAGNOSIS — N186 End stage renal disease: Secondary | ICD-10-CM | POA: Diagnosis not present

## 2019-07-26 DIAGNOSIS — N250 Renal osteodystrophy: Secondary | ICD-10-CM | POA: Diagnosis not present

## 2019-07-28 DIAGNOSIS — N186 End stage renal disease: Secondary | ICD-10-CM | POA: Diagnosis not present

## 2019-07-28 DIAGNOSIS — D631 Anemia in chronic kidney disease: Secondary | ICD-10-CM | POA: Diagnosis not present

## 2019-07-28 DIAGNOSIS — Z992 Dependence on renal dialysis: Secondary | ICD-10-CM | POA: Diagnosis not present

## 2019-07-28 DIAGNOSIS — N2581 Secondary hyperparathyroidism of renal origin: Secondary | ICD-10-CM | POA: Diagnosis not present

## 2019-07-31 DIAGNOSIS — Z992 Dependence on renal dialysis: Secondary | ICD-10-CM | POA: Diagnosis not present

## 2019-07-31 DIAGNOSIS — D509 Iron deficiency anemia, unspecified: Secondary | ICD-10-CM | POA: Diagnosis not present

## 2019-07-31 DIAGNOSIS — N250 Renal osteodystrophy: Secondary | ICD-10-CM | POA: Diagnosis not present

## 2019-07-31 DIAGNOSIS — R110 Nausea: Secondary | ICD-10-CM | POA: Diagnosis not present

## 2019-07-31 DIAGNOSIS — N186 End stage renal disease: Secondary | ICD-10-CM | POA: Diagnosis not present

## 2019-07-31 DIAGNOSIS — D631 Anemia in chronic kidney disease: Secondary | ICD-10-CM | POA: Diagnosis not present

## 2019-08-02 DIAGNOSIS — N250 Renal osteodystrophy: Secondary | ICD-10-CM | POA: Diagnosis not present

## 2019-08-02 DIAGNOSIS — R110 Nausea: Secondary | ICD-10-CM | POA: Diagnosis not present

## 2019-08-02 DIAGNOSIS — D509 Iron deficiency anemia, unspecified: Secondary | ICD-10-CM | POA: Diagnosis not present

## 2019-08-02 DIAGNOSIS — Z992 Dependence on renal dialysis: Secondary | ICD-10-CM | POA: Diagnosis not present

## 2019-08-02 DIAGNOSIS — N186 End stage renal disease: Secondary | ICD-10-CM | POA: Diagnosis not present

## 2019-08-02 DIAGNOSIS — D631 Anemia in chronic kidney disease: Secondary | ICD-10-CM | POA: Diagnosis not present

## 2019-08-04 DIAGNOSIS — R110 Nausea: Secondary | ICD-10-CM | POA: Diagnosis not present

## 2019-08-04 DIAGNOSIS — N250 Renal osteodystrophy: Secondary | ICD-10-CM | POA: Diagnosis not present

## 2019-08-04 DIAGNOSIS — N186 End stage renal disease: Secondary | ICD-10-CM | POA: Diagnosis not present

## 2019-08-04 DIAGNOSIS — D509 Iron deficiency anemia, unspecified: Secondary | ICD-10-CM | POA: Diagnosis not present

## 2019-08-04 DIAGNOSIS — Z992 Dependence on renal dialysis: Secondary | ICD-10-CM | POA: Diagnosis not present

## 2019-08-04 DIAGNOSIS — D631 Anemia in chronic kidney disease: Secondary | ICD-10-CM | POA: Diagnosis not present

## 2019-08-07 DIAGNOSIS — N250 Renal osteodystrophy: Secondary | ICD-10-CM | POA: Diagnosis not present

## 2019-08-07 DIAGNOSIS — R110 Nausea: Secondary | ICD-10-CM | POA: Diagnosis not present

## 2019-08-07 DIAGNOSIS — D631 Anemia in chronic kidney disease: Secondary | ICD-10-CM | POA: Diagnosis not present

## 2019-08-07 DIAGNOSIS — N186 End stage renal disease: Secondary | ICD-10-CM | POA: Diagnosis not present

## 2019-08-07 DIAGNOSIS — Z992 Dependence on renal dialysis: Secondary | ICD-10-CM | POA: Diagnosis not present

## 2019-08-07 DIAGNOSIS — D509 Iron deficiency anemia, unspecified: Secondary | ICD-10-CM | POA: Diagnosis not present

## 2019-08-08 DIAGNOSIS — N186 End stage renal disease: Secondary | ICD-10-CM | POA: Diagnosis not present

## 2019-08-08 DIAGNOSIS — Z992 Dependence on renal dialysis: Secondary | ICD-10-CM | POA: Diagnosis not present

## 2019-08-09 DIAGNOSIS — R110 Nausea: Secondary | ICD-10-CM | POA: Diagnosis not present

## 2019-08-09 DIAGNOSIS — N250 Renal osteodystrophy: Secondary | ICD-10-CM | POA: Diagnosis not present

## 2019-08-09 DIAGNOSIS — Z992 Dependence on renal dialysis: Secondary | ICD-10-CM | POA: Diagnosis not present

## 2019-08-09 DIAGNOSIS — N186 End stage renal disease: Secondary | ICD-10-CM | POA: Diagnosis not present

## 2019-08-09 DIAGNOSIS — D631 Anemia in chronic kidney disease: Secondary | ICD-10-CM | POA: Diagnosis not present

## 2019-08-09 DIAGNOSIS — D509 Iron deficiency anemia, unspecified: Secondary | ICD-10-CM | POA: Diagnosis not present

## 2019-08-10 DIAGNOSIS — E7800 Pure hypercholesterolemia, unspecified: Secondary | ICD-10-CM | POA: Diagnosis not present

## 2019-08-10 DIAGNOSIS — I10 Essential (primary) hypertension: Secondary | ICD-10-CM | POA: Diagnosis not present

## 2019-08-11 DIAGNOSIS — N250 Renal osteodystrophy: Secondary | ICD-10-CM | POA: Diagnosis not present

## 2019-08-11 DIAGNOSIS — Z992 Dependence on renal dialysis: Secondary | ICD-10-CM | POA: Diagnosis not present

## 2019-08-11 DIAGNOSIS — N186 End stage renal disease: Secondary | ICD-10-CM | POA: Diagnosis not present

## 2019-08-11 DIAGNOSIS — R110 Nausea: Secondary | ICD-10-CM | POA: Diagnosis not present

## 2019-08-11 DIAGNOSIS — D509 Iron deficiency anemia, unspecified: Secondary | ICD-10-CM | POA: Diagnosis not present

## 2019-08-11 DIAGNOSIS — D631 Anemia in chronic kidney disease: Secondary | ICD-10-CM | POA: Diagnosis not present

## 2019-08-14 DIAGNOSIS — D509 Iron deficiency anemia, unspecified: Secondary | ICD-10-CM | POA: Diagnosis not present

## 2019-08-14 DIAGNOSIS — N186 End stage renal disease: Secondary | ICD-10-CM | POA: Diagnosis not present

## 2019-08-14 DIAGNOSIS — N250 Renal osteodystrophy: Secondary | ICD-10-CM | POA: Diagnosis not present

## 2019-08-14 DIAGNOSIS — R110 Nausea: Secondary | ICD-10-CM | POA: Diagnosis not present

## 2019-08-14 DIAGNOSIS — Z992 Dependence on renal dialysis: Secondary | ICD-10-CM | POA: Diagnosis not present

## 2019-08-14 DIAGNOSIS — D631 Anemia in chronic kidney disease: Secondary | ICD-10-CM | POA: Diagnosis not present

## 2019-08-16 DIAGNOSIS — R110 Nausea: Secondary | ICD-10-CM | POA: Diagnosis not present

## 2019-08-16 DIAGNOSIS — N186 End stage renal disease: Secondary | ICD-10-CM | POA: Diagnosis not present

## 2019-08-16 DIAGNOSIS — Z992 Dependence on renal dialysis: Secondary | ICD-10-CM | POA: Diagnosis not present

## 2019-08-16 DIAGNOSIS — N250 Renal osteodystrophy: Secondary | ICD-10-CM | POA: Diagnosis not present

## 2019-08-16 DIAGNOSIS — D631 Anemia in chronic kidney disease: Secondary | ICD-10-CM | POA: Diagnosis not present

## 2019-08-16 DIAGNOSIS — D509 Iron deficiency anemia, unspecified: Secondary | ICD-10-CM | POA: Diagnosis not present

## 2019-08-20 DIAGNOSIS — R110 Nausea: Secondary | ICD-10-CM | POA: Diagnosis not present

## 2019-08-20 DIAGNOSIS — Z992 Dependence on renal dialysis: Secondary | ICD-10-CM | POA: Diagnosis not present

## 2019-08-20 DIAGNOSIS — N250 Renal osteodystrophy: Secondary | ICD-10-CM | POA: Diagnosis not present

## 2019-08-20 DIAGNOSIS — D631 Anemia in chronic kidney disease: Secondary | ICD-10-CM | POA: Diagnosis not present

## 2019-08-20 DIAGNOSIS — D509 Iron deficiency anemia, unspecified: Secondary | ICD-10-CM | POA: Diagnosis not present

## 2019-08-20 DIAGNOSIS — N186 End stage renal disease: Secondary | ICD-10-CM | POA: Diagnosis not present

## 2019-08-21 DIAGNOSIS — R110 Nausea: Secondary | ICD-10-CM | POA: Diagnosis not present

## 2019-08-21 DIAGNOSIS — N250 Renal osteodystrophy: Secondary | ICD-10-CM | POA: Diagnosis not present

## 2019-08-21 DIAGNOSIS — N186 End stage renal disease: Secondary | ICD-10-CM | POA: Diagnosis not present

## 2019-08-21 DIAGNOSIS — D509 Iron deficiency anemia, unspecified: Secondary | ICD-10-CM | POA: Diagnosis not present

## 2019-08-21 DIAGNOSIS — Z992 Dependence on renal dialysis: Secondary | ICD-10-CM | POA: Diagnosis not present

## 2019-08-21 DIAGNOSIS — D631 Anemia in chronic kidney disease: Secondary | ICD-10-CM | POA: Diagnosis not present

## 2019-08-23 DIAGNOSIS — Z992 Dependence on renal dialysis: Secondary | ICD-10-CM | POA: Diagnosis not present

## 2019-08-23 DIAGNOSIS — D631 Anemia in chronic kidney disease: Secondary | ICD-10-CM | POA: Diagnosis not present

## 2019-08-23 DIAGNOSIS — R110 Nausea: Secondary | ICD-10-CM | POA: Diagnosis not present

## 2019-08-23 DIAGNOSIS — N186 End stage renal disease: Secondary | ICD-10-CM | POA: Diagnosis not present

## 2019-08-23 DIAGNOSIS — N250 Renal osteodystrophy: Secondary | ICD-10-CM | POA: Diagnosis not present

## 2019-08-23 DIAGNOSIS — D509 Iron deficiency anemia, unspecified: Secondary | ICD-10-CM | POA: Diagnosis not present

## 2019-08-25 DIAGNOSIS — N250 Renal osteodystrophy: Secondary | ICD-10-CM | POA: Diagnosis not present

## 2019-08-25 DIAGNOSIS — D509 Iron deficiency anemia, unspecified: Secondary | ICD-10-CM | POA: Diagnosis not present

## 2019-08-25 DIAGNOSIS — R110 Nausea: Secondary | ICD-10-CM | POA: Diagnosis not present

## 2019-08-25 DIAGNOSIS — D631 Anemia in chronic kidney disease: Secondary | ICD-10-CM | POA: Diagnosis not present

## 2019-08-25 DIAGNOSIS — Z992 Dependence on renal dialysis: Secondary | ICD-10-CM | POA: Diagnosis not present

## 2019-08-25 DIAGNOSIS — N186 End stage renal disease: Secondary | ICD-10-CM | POA: Diagnosis not present

## 2019-08-28 DIAGNOSIS — N250 Renal osteodystrophy: Secondary | ICD-10-CM | POA: Diagnosis not present

## 2019-08-28 DIAGNOSIS — Z992 Dependence on renal dialysis: Secondary | ICD-10-CM | POA: Diagnosis not present

## 2019-08-28 DIAGNOSIS — D631 Anemia in chronic kidney disease: Secondary | ICD-10-CM | POA: Diagnosis not present

## 2019-08-28 DIAGNOSIS — R110 Nausea: Secondary | ICD-10-CM | POA: Diagnosis not present

## 2019-08-28 DIAGNOSIS — N186 End stage renal disease: Secondary | ICD-10-CM | POA: Diagnosis not present

## 2019-08-28 DIAGNOSIS — D509 Iron deficiency anemia, unspecified: Secondary | ICD-10-CM | POA: Diagnosis not present

## 2019-08-30 DIAGNOSIS — D631 Anemia in chronic kidney disease: Secondary | ICD-10-CM | POA: Diagnosis not present

## 2019-08-30 DIAGNOSIS — Z992 Dependence on renal dialysis: Secondary | ICD-10-CM | POA: Diagnosis not present

## 2019-08-30 DIAGNOSIS — N186 End stage renal disease: Secondary | ICD-10-CM | POA: Diagnosis not present

## 2019-08-30 DIAGNOSIS — R110 Nausea: Secondary | ICD-10-CM | POA: Diagnosis not present

## 2019-08-30 DIAGNOSIS — N250 Renal osteodystrophy: Secondary | ICD-10-CM | POA: Diagnosis not present

## 2019-08-30 DIAGNOSIS — D509 Iron deficiency anemia, unspecified: Secondary | ICD-10-CM | POA: Diagnosis not present

## 2019-09-01 DIAGNOSIS — Z992 Dependence on renal dialysis: Secondary | ICD-10-CM | POA: Diagnosis not present

## 2019-09-01 DIAGNOSIS — D631 Anemia in chronic kidney disease: Secondary | ICD-10-CM | POA: Diagnosis not present

## 2019-09-01 DIAGNOSIS — N186 End stage renal disease: Secondary | ICD-10-CM | POA: Diagnosis not present

## 2019-09-01 DIAGNOSIS — N250 Renal osteodystrophy: Secondary | ICD-10-CM | POA: Diagnosis not present

## 2019-09-01 DIAGNOSIS — R110 Nausea: Secondary | ICD-10-CM | POA: Diagnosis not present

## 2019-09-01 DIAGNOSIS — D509 Iron deficiency anemia, unspecified: Secondary | ICD-10-CM | POA: Diagnosis not present

## 2019-09-04 DIAGNOSIS — Z992 Dependence on renal dialysis: Secondary | ICD-10-CM | POA: Diagnosis not present

## 2019-09-04 DIAGNOSIS — R110 Nausea: Secondary | ICD-10-CM | POA: Diagnosis not present

## 2019-09-04 DIAGNOSIS — D509 Iron deficiency anemia, unspecified: Secondary | ICD-10-CM | POA: Diagnosis not present

## 2019-09-04 DIAGNOSIS — D631 Anemia in chronic kidney disease: Secondary | ICD-10-CM | POA: Diagnosis not present

## 2019-09-04 DIAGNOSIS — N250 Renal osteodystrophy: Secondary | ICD-10-CM | POA: Diagnosis not present

## 2019-09-04 DIAGNOSIS — N186 End stage renal disease: Secondary | ICD-10-CM | POA: Diagnosis not present

## 2019-09-06 DIAGNOSIS — N250 Renal osteodystrophy: Secondary | ICD-10-CM | POA: Diagnosis not present

## 2019-09-06 DIAGNOSIS — D509 Iron deficiency anemia, unspecified: Secondary | ICD-10-CM | POA: Diagnosis not present

## 2019-09-06 DIAGNOSIS — D631 Anemia in chronic kidney disease: Secondary | ICD-10-CM | POA: Diagnosis not present

## 2019-09-06 DIAGNOSIS — Z992 Dependence on renal dialysis: Secondary | ICD-10-CM | POA: Diagnosis not present

## 2019-09-06 DIAGNOSIS — R110 Nausea: Secondary | ICD-10-CM | POA: Diagnosis not present

## 2019-09-06 DIAGNOSIS — N186 End stage renal disease: Secondary | ICD-10-CM | POA: Diagnosis not present

## 2019-09-08 DIAGNOSIS — Z992 Dependence on renal dialysis: Secondary | ICD-10-CM | POA: Diagnosis not present

## 2019-09-08 DIAGNOSIS — N186 End stage renal disease: Secondary | ICD-10-CM | POA: Diagnosis not present

## 2019-09-08 DIAGNOSIS — N250 Renal osteodystrophy: Secondary | ICD-10-CM | POA: Diagnosis not present

## 2019-09-08 DIAGNOSIS — D509 Iron deficiency anemia, unspecified: Secondary | ICD-10-CM | POA: Diagnosis not present

## 2019-09-08 DIAGNOSIS — R110 Nausea: Secondary | ICD-10-CM | POA: Diagnosis not present

## 2019-09-08 DIAGNOSIS — D631 Anemia in chronic kidney disease: Secondary | ICD-10-CM | POA: Diagnosis not present

## 2019-09-11 DIAGNOSIS — N186 End stage renal disease: Secondary | ICD-10-CM | POA: Diagnosis not present

## 2019-09-11 DIAGNOSIS — D509 Iron deficiency anemia, unspecified: Secondary | ICD-10-CM | POA: Diagnosis not present

## 2019-09-11 DIAGNOSIS — D631 Anemia in chronic kidney disease: Secondary | ICD-10-CM | POA: Diagnosis not present

## 2019-09-11 DIAGNOSIS — N250 Renal osteodystrophy: Secondary | ICD-10-CM | POA: Diagnosis not present

## 2019-09-11 DIAGNOSIS — R110 Nausea: Secondary | ICD-10-CM | POA: Diagnosis not present

## 2019-09-11 DIAGNOSIS — Z992 Dependence on renal dialysis: Secondary | ICD-10-CM | POA: Diagnosis not present

## 2019-09-11 DIAGNOSIS — Z23 Encounter for immunization: Secondary | ICD-10-CM | POA: Diagnosis not present

## 2019-09-13 DIAGNOSIS — N250 Renal osteodystrophy: Secondary | ICD-10-CM | POA: Diagnosis not present

## 2019-09-13 DIAGNOSIS — Z23 Encounter for immunization: Secondary | ICD-10-CM | POA: Diagnosis not present

## 2019-09-13 DIAGNOSIS — R110 Nausea: Secondary | ICD-10-CM | POA: Diagnosis not present

## 2019-09-13 DIAGNOSIS — D509 Iron deficiency anemia, unspecified: Secondary | ICD-10-CM | POA: Diagnosis not present

## 2019-09-13 DIAGNOSIS — N186 End stage renal disease: Secondary | ICD-10-CM | POA: Diagnosis not present

## 2019-09-13 DIAGNOSIS — Z992 Dependence on renal dialysis: Secondary | ICD-10-CM | POA: Diagnosis not present

## 2019-09-15 DIAGNOSIS — Z23 Encounter for immunization: Secondary | ICD-10-CM | POA: Diagnosis not present

## 2019-09-15 DIAGNOSIS — Z992 Dependence on renal dialysis: Secondary | ICD-10-CM | POA: Diagnosis not present

## 2019-09-15 DIAGNOSIS — R110 Nausea: Secondary | ICD-10-CM | POA: Diagnosis not present

## 2019-09-15 DIAGNOSIS — D509 Iron deficiency anemia, unspecified: Secondary | ICD-10-CM | POA: Diagnosis not present

## 2019-09-15 DIAGNOSIS — N250 Renal osteodystrophy: Secondary | ICD-10-CM | POA: Diagnosis not present

## 2019-09-15 DIAGNOSIS — N186 End stage renal disease: Secondary | ICD-10-CM | POA: Diagnosis not present

## 2019-09-18 DIAGNOSIS — D509 Iron deficiency anemia, unspecified: Secondary | ICD-10-CM | POA: Diagnosis not present

## 2019-09-18 DIAGNOSIS — R110 Nausea: Secondary | ICD-10-CM | POA: Diagnosis not present

## 2019-09-18 DIAGNOSIS — N250 Renal osteodystrophy: Secondary | ICD-10-CM | POA: Diagnosis not present

## 2019-09-18 DIAGNOSIS — Z992 Dependence on renal dialysis: Secondary | ICD-10-CM | POA: Diagnosis not present

## 2019-09-18 DIAGNOSIS — N186 End stage renal disease: Secondary | ICD-10-CM | POA: Diagnosis not present

## 2019-09-18 DIAGNOSIS — Z23 Encounter for immunization: Secondary | ICD-10-CM | POA: Diagnosis not present

## 2019-09-20 DIAGNOSIS — N186 End stage renal disease: Secondary | ICD-10-CM | POA: Diagnosis not present

## 2019-09-20 DIAGNOSIS — R110 Nausea: Secondary | ICD-10-CM | POA: Diagnosis not present

## 2019-09-20 DIAGNOSIS — Z23 Encounter for immunization: Secondary | ICD-10-CM | POA: Diagnosis not present

## 2019-09-20 DIAGNOSIS — Z992 Dependence on renal dialysis: Secondary | ICD-10-CM | POA: Diagnosis not present

## 2019-09-20 DIAGNOSIS — D509 Iron deficiency anemia, unspecified: Secondary | ICD-10-CM | POA: Diagnosis not present

## 2019-09-20 DIAGNOSIS — N250 Renal osteodystrophy: Secondary | ICD-10-CM | POA: Diagnosis not present

## 2019-09-22 DIAGNOSIS — D509 Iron deficiency anemia, unspecified: Secondary | ICD-10-CM | POA: Diagnosis not present

## 2019-09-22 DIAGNOSIS — Z992 Dependence on renal dialysis: Secondary | ICD-10-CM | POA: Diagnosis not present

## 2019-09-22 DIAGNOSIS — Z23 Encounter for immunization: Secondary | ICD-10-CM | POA: Diagnosis not present

## 2019-09-22 DIAGNOSIS — R110 Nausea: Secondary | ICD-10-CM | POA: Diagnosis not present

## 2019-09-22 DIAGNOSIS — N250 Renal osteodystrophy: Secondary | ICD-10-CM | POA: Diagnosis not present

## 2019-09-22 DIAGNOSIS — N186 End stage renal disease: Secondary | ICD-10-CM | POA: Diagnosis not present

## 2019-09-25 DIAGNOSIS — N250 Renal osteodystrophy: Secondary | ICD-10-CM | POA: Diagnosis not present

## 2019-09-25 DIAGNOSIS — D509 Iron deficiency anemia, unspecified: Secondary | ICD-10-CM | POA: Diagnosis not present

## 2019-09-25 DIAGNOSIS — R110 Nausea: Secondary | ICD-10-CM | POA: Diagnosis not present

## 2019-09-25 DIAGNOSIS — Z992 Dependence on renal dialysis: Secondary | ICD-10-CM | POA: Diagnosis not present

## 2019-09-25 DIAGNOSIS — N186 End stage renal disease: Secondary | ICD-10-CM | POA: Diagnosis not present

## 2019-09-25 DIAGNOSIS — Z23 Encounter for immunization: Secondary | ICD-10-CM | POA: Diagnosis not present

## 2019-09-28 DIAGNOSIS — D509 Iron deficiency anemia, unspecified: Secondary | ICD-10-CM | POA: Diagnosis not present

## 2019-09-28 DIAGNOSIS — N250 Renal osteodystrophy: Secondary | ICD-10-CM | POA: Diagnosis not present

## 2019-09-28 DIAGNOSIS — Z23 Encounter for immunization: Secondary | ICD-10-CM | POA: Diagnosis not present

## 2019-09-28 DIAGNOSIS — Z992 Dependence on renal dialysis: Secondary | ICD-10-CM | POA: Diagnosis not present

## 2019-09-28 DIAGNOSIS — R110 Nausea: Secondary | ICD-10-CM | POA: Diagnosis not present

## 2019-09-28 DIAGNOSIS — N186 End stage renal disease: Secondary | ICD-10-CM | POA: Diagnosis not present

## 2019-09-29 DIAGNOSIS — D509 Iron deficiency anemia, unspecified: Secondary | ICD-10-CM | POA: Diagnosis not present

## 2019-09-29 DIAGNOSIS — Z992 Dependence on renal dialysis: Secondary | ICD-10-CM | POA: Diagnosis not present

## 2019-09-29 DIAGNOSIS — N186 End stage renal disease: Secondary | ICD-10-CM | POA: Diagnosis not present

## 2019-09-29 DIAGNOSIS — R110 Nausea: Secondary | ICD-10-CM | POA: Diagnosis not present

## 2019-09-29 DIAGNOSIS — Z23 Encounter for immunization: Secondary | ICD-10-CM | POA: Diagnosis not present

## 2019-09-29 DIAGNOSIS — N250 Renal osteodystrophy: Secondary | ICD-10-CM | POA: Diagnosis not present

## 2019-10-02 DIAGNOSIS — D509 Iron deficiency anemia, unspecified: Secondary | ICD-10-CM | POA: Diagnosis not present

## 2019-10-02 DIAGNOSIS — N250 Renal osteodystrophy: Secondary | ICD-10-CM | POA: Diagnosis not present

## 2019-10-02 DIAGNOSIS — N186 End stage renal disease: Secondary | ICD-10-CM | POA: Diagnosis not present

## 2019-10-02 DIAGNOSIS — R110 Nausea: Secondary | ICD-10-CM | POA: Diagnosis not present

## 2019-10-02 DIAGNOSIS — Z23 Encounter for immunization: Secondary | ICD-10-CM | POA: Diagnosis not present

## 2019-10-02 DIAGNOSIS — Z992 Dependence on renal dialysis: Secondary | ICD-10-CM | POA: Diagnosis not present

## 2019-10-05 DIAGNOSIS — N250 Renal osteodystrophy: Secondary | ICD-10-CM | POA: Diagnosis not present

## 2019-10-05 DIAGNOSIS — R110 Nausea: Secondary | ICD-10-CM | POA: Diagnosis not present

## 2019-10-05 DIAGNOSIS — Z23 Encounter for immunization: Secondary | ICD-10-CM | POA: Diagnosis not present

## 2019-10-05 DIAGNOSIS — Z992 Dependence on renal dialysis: Secondary | ICD-10-CM | POA: Diagnosis not present

## 2019-10-05 DIAGNOSIS — N186 End stage renal disease: Secondary | ICD-10-CM | POA: Diagnosis not present

## 2019-10-05 DIAGNOSIS — D509 Iron deficiency anemia, unspecified: Secondary | ICD-10-CM | POA: Diagnosis not present

## 2019-10-06 DIAGNOSIS — Z992 Dependence on renal dialysis: Secondary | ICD-10-CM | POA: Diagnosis not present

## 2019-10-06 DIAGNOSIS — Z23 Encounter for immunization: Secondary | ICD-10-CM | POA: Diagnosis not present

## 2019-10-06 DIAGNOSIS — R110 Nausea: Secondary | ICD-10-CM | POA: Diagnosis not present

## 2019-10-06 DIAGNOSIS — N186 End stage renal disease: Secondary | ICD-10-CM | POA: Diagnosis not present

## 2019-10-06 DIAGNOSIS — D509 Iron deficiency anemia, unspecified: Secondary | ICD-10-CM | POA: Diagnosis not present

## 2019-10-06 DIAGNOSIS — N250 Renal osteodystrophy: Secondary | ICD-10-CM | POA: Diagnosis not present

## 2019-10-08 DIAGNOSIS — N186 End stage renal disease: Secondary | ICD-10-CM | POA: Diagnosis not present

## 2019-10-08 DIAGNOSIS — Z45018 Encounter for adjustment and management of other part of cardiac pacemaker: Secondary | ICD-10-CM | POA: Diagnosis not present

## 2019-10-08 DIAGNOSIS — Z950 Presence of cardiac pacemaker: Secondary | ICD-10-CM | POA: Diagnosis not present

## 2019-10-08 DIAGNOSIS — I442 Atrioventricular block, complete: Secondary | ICD-10-CM | POA: Diagnosis not present

## 2019-10-08 DIAGNOSIS — Z992 Dependence on renal dialysis: Secondary | ICD-10-CM | POA: Diagnosis not present

## 2019-10-12 DIAGNOSIS — I10 Essential (primary) hypertension: Secondary | ICD-10-CM | POA: Diagnosis not present

## 2019-10-12 DIAGNOSIS — E7800 Pure hypercholesterolemia, unspecified: Secondary | ICD-10-CM | POA: Diagnosis not present

## 2019-11-10 DIAGNOSIS — Z992 Dependence on renal dialysis: Secondary | ICD-10-CM | POA: Diagnosis not present

## 2019-11-10 DIAGNOSIS — N250 Renal osteodystrophy: Secondary | ICD-10-CM | POA: Diagnosis not present

## 2019-11-10 DIAGNOSIS — D509 Iron deficiency anemia, unspecified: Secondary | ICD-10-CM | POA: Diagnosis not present

## 2019-11-10 DIAGNOSIS — D631 Anemia in chronic kidney disease: Secondary | ICD-10-CM | POA: Diagnosis not present

## 2019-11-10 DIAGNOSIS — R110 Nausea: Secondary | ICD-10-CM | POA: Diagnosis not present

## 2019-11-10 DIAGNOSIS — N186 End stage renal disease: Secondary | ICD-10-CM | POA: Diagnosis not present

## 2019-11-12 DIAGNOSIS — I10 Essential (primary) hypertension: Secondary | ICD-10-CM | POA: Diagnosis not present

## 2019-11-12 DIAGNOSIS — E7800 Pure hypercholesterolemia, unspecified: Secondary | ICD-10-CM | POA: Diagnosis not present

## 2019-11-12 DIAGNOSIS — Z941 Heart transplant status: Secondary | ICD-10-CM | POA: Diagnosis not present

## 2019-11-12 DIAGNOSIS — T86290 Cardiac allograft vasculopathy: Secondary | ICD-10-CM | POA: Diagnosis not present

## 2019-11-12 DIAGNOSIS — Z4821 Encounter for aftercare following heart transplant: Secondary | ICD-10-CM | POA: Diagnosis not present

## 2019-11-12 DIAGNOSIS — I25811 Atherosclerosis of native coronary artery of transplanted heart without angina pectoris: Secondary | ICD-10-CM | POA: Diagnosis not present

## 2019-11-13 DIAGNOSIS — N186 End stage renal disease: Secondary | ICD-10-CM | POA: Diagnosis not present

## 2019-11-13 DIAGNOSIS — Z992 Dependence on renal dialysis: Secondary | ICD-10-CM | POA: Diagnosis not present

## 2019-11-13 DIAGNOSIS — D631 Anemia in chronic kidney disease: Secondary | ICD-10-CM | POA: Diagnosis not present

## 2019-11-13 DIAGNOSIS — R110 Nausea: Secondary | ICD-10-CM | POA: Diagnosis not present

## 2019-11-13 DIAGNOSIS — N250 Renal osteodystrophy: Secondary | ICD-10-CM | POA: Diagnosis not present

## 2019-11-13 DIAGNOSIS — D509 Iron deficiency anemia, unspecified: Secondary | ICD-10-CM | POA: Diagnosis not present

## 2019-11-14 DIAGNOSIS — L821 Other seborrheic keratosis: Secondary | ICD-10-CM | POA: Diagnosis not present

## 2019-11-14 DIAGNOSIS — D849 Immunodeficiency, unspecified: Secondary | ICD-10-CM | POA: Diagnosis not present

## 2019-11-14 DIAGNOSIS — B078 Other viral warts: Secondary | ICD-10-CM | POA: Diagnosis not present

## 2019-11-14 DIAGNOSIS — A630 Anogenital (venereal) warts: Secondary | ICD-10-CM | POA: Diagnosis not present

## 2019-11-14 DIAGNOSIS — Z85828 Personal history of other malignant neoplasm of skin: Secondary | ICD-10-CM | POA: Diagnosis not present

## 2019-11-14 DIAGNOSIS — Z87891 Personal history of nicotine dependence: Secondary | ICD-10-CM | POA: Diagnosis not present

## 2019-11-14 DIAGNOSIS — Z941 Heart transplant status: Secondary | ICD-10-CM | POA: Diagnosis not present

## 2019-11-14 DIAGNOSIS — D489 Neoplasm of uncertain behavior, unspecified: Secondary | ICD-10-CM | POA: Diagnosis not present

## 2019-11-15 DIAGNOSIS — R110 Nausea: Secondary | ICD-10-CM | POA: Diagnosis not present

## 2019-11-15 DIAGNOSIS — Z992 Dependence on renal dialysis: Secondary | ICD-10-CM | POA: Diagnosis not present

## 2019-11-15 DIAGNOSIS — N250 Renal osteodystrophy: Secondary | ICD-10-CM | POA: Diagnosis not present

## 2019-11-15 DIAGNOSIS — D631 Anemia in chronic kidney disease: Secondary | ICD-10-CM | POA: Diagnosis not present

## 2019-11-15 DIAGNOSIS — D509 Iron deficiency anemia, unspecified: Secondary | ICD-10-CM | POA: Diagnosis not present

## 2019-11-15 DIAGNOSIS — N186 End stage renal disease: Secondary | ICD-10-CM | POA: Diagnosis not present

## 2019-11-17 DIAGNOSIS — R110 Nausea: Secondary | ICD-10-CM | POA: Diagnosis not present

## 2019-11-17 DIAGNOSIS — Z992 Dependence on renal dialysis: Secondary | ICD-10-CM | POA: Diagnosis not present

## 2019-11-17 DIAGNOSIS — N250 Renal osteodystrophy: Secondary | ICD-10-CM | POA: Diagnosis not present

## 2019-11-17 DIAGNOSIS — N186 End stage renal disease: Secondary | ICD-10-CM | POA: Diagnosis not present

## 2019-11-17 DIAGNOSIS — D631 Anemia in chronic kidney disease: Secondary | ICD-10-CM | POA: Diagnosis not present

## 2019-11-17 DIAGNOSIS — D509 Iron deficiency anemia, unspecified: Secondary | ICD-10-CM | POA: Diagnosis not present

## 2019-11-20 DIAGNOSIS — D631 Anemia in chronic kidney disease: Secondary | ICD-10-CM | POA: Diagnosis not present

## 2019-11-20 DIAGNOSIS — N186 End stage renal disease: Secondary | ICD-10-CM | POA: Diagnosis not present

## 2019-11-20 DIAGNOSIS — R110 Nausea: Secondary | ICD-10-CM | POA: Diagnosis not present

## 2019-11-20 DIAGNOSIS — D509 Iron deficiency anemia, unspecified: Secondary | ICD-10-CM | POA: Diagnosis not present

## 2019-11-20 DIAGNOSIS — N250 Renal osteodystrophy: Secondary | ICD-10-CM | POA: Diagnosis not present

## 2019-11-20 DIAGNOSIS — Z992 Dependence on renal dialysis: Secondary | ICD-10-CM | POA: Diagnosis not present

## 2019-11-23 DIAGNOSIS — N250 Renal osteodystrophy: Secondary | ICD-10-CM | POA: Diagnosis not present

## 2019-11-23 DIAGNOSIS — Z20822 Contact with and (suspected) exposure to covid-19: Secondary | ICD-10-CM | POA: Diagnosis not present

## 2019-11-23 DIAGNOSIS — Z992 Dependence on renal dialysis: Secondary | ICD-10-CM | POA: Diagnosis not present

## 2019-11-23 DIAGNOSIS — R519 Headache, unspecified: Secondary | ICD-10-CM | POA: Diagnosis not present

## 2019-11-23 DIAGNOSIS — N186 End stage renal disease: Secondary | ICD-10-CM | POA: Diagnosis not present

## 2019-11-23 DIAGNOSIS — D631 Anemia in chronic kidney disease: Secondary | ICD-10-CM | POA: Diagnosis not present

## 2019-11-23 DIAGNOSIS — R110 Nausea: Secondary | ICD-10-CM | POA: Diagnosis not present

## 2019-11-23 DIAGNOSIS — R112 Nausea with vomiting, unspecified: Secondary | ICD-10-CM | POA: Diagnosis not present

## 2019-11-23 DIAGNOSIS — D509 Iron deficiency anemia, unspecified: Secondary | ICD-10-CM | POA: Diagnosis not present

## 2019-11-27 DIAGNOSIS — D013 Carcinoma in situ of anus and anal canal: Secondary | ICD-10-CM | POA: Diagnosis not present

## 2019-11-27 DIAGNOSIS — Z992 Dependence on renal dialysis: Secondary | ICD-10-CM | POA: Diagnosis not present

## 2019-11-27 DIAGNOSIS — D509 Iron deficiency anemia, unspecified: Secondary | ICD-10-CM | POA: Diagnosis not present

## 2019-11-27 DIAGNOSIS — R110 Nausea: Secondary | ICD-10-CM | POA: Diagnosis not present

## 2019-11-27 DIAGNOSIS — N250 Renal osteodystrophy: Secondary | ICD-10-CM | POA: Diagnosis not present

## 2019-11-27 DIAGNOSIS — D631 Anemia in chronic kidney disease: Secondary | ICD-10-CM | POA: Diagnosis not present

## 2019-11-27 DIAGNOSIS — N186 End stage renal disease: Secondary | ICD-10-CM | POA: Diagnosis not present

## 2019-11-29 DIAGNOSIS — Z992 Dependence on renal dialysis: Secondary | ICD-10-CM | POA: Diagnosis not present

## 2019-11-29 DIAGNOSIS — N186 End stage renal disease: Secondary | ICD-10-CM | POA: Diagnosis not present

## 2019-11-29 DIAGNOSIS — R110 Nausea: Secondary | ICD-10-CM | POA: Diagnosis not present

## 2019-11-29 DIAGNOSIS — D509 Iron deficiency anemia, unspecified: Secondary | ICD-10-CM | POA: Diagnosis not present

## 2019-11-29 DIAGNOSIS — N250 Renal osteodystrophy: Secondary | ICD-10-CM | POA: Diagnosis not present

## 2019-11-29 DIAGNOSIS — D631 Anemia in chronic kidney disease: Secondary | ICD-10-CM | POA: Diagnosis not present

## 2019-12-01 DIAGNOSIS — D631 Anemia in chronic kidney disease: Secondary | ICD-10-CM | POA: Diagnosis not present

## 2019-12-01 DIAGNOSIS — D509 Iron deficiency anemia, unspecified: Secondary | ICD-10-CM | POA: Diagnosis not present

## 2019-12-01 DIAGNOSIS — R110 Nausea: Secondary | ICD-10-CM | POA: Diagnosis not present

## 2019-12-01 DIAGNOSIS — Z992 Dependence on renal dialysis: Secondary | ICD-10-CM | POA: Diagnosis not present

## 2019-12-01 DIAGNOSIS — N186 End stage renal disease: Secondary | ICD-10-CM | POA: Diagnosis not present

## 2019-12-01 DIAGNOSIS — N250 Renal osteodystrophy: Secondary | ICD-10-CM | POA: Diagnosis not present

## 2019-12-04 DIAGNOSIS — R110 Nausea: Secondary | ICD-10-CM | POA: Diagnosis not present

## 2019-12-04 DIAGNOSIS — N186 End stage renal disease: Secondary | ICD-10-CM | POA: Diagnosis not present

## 2019-12-04 DIAGNOSIS — Z992 Dependence on renal dialysis: Secondary | ICD-10-CM | POA: Diagnosis not present

## 2019-12-04 DIAGNOSIS — D631 Anemia in chronic kidney disease: Secondary | ICD-10-CM | POA: Diagnosis not present

## 2019-12-04 DIAGNOSIS — D509 Iron deficiency anemia, unspecified: Secondary | ICD-10-CM | POA: Diagnosis not present

## 2019-12-04 DIAGNOSIS — N250 Renal osteodystrophy: Secondary | ICD-10-CM | POA: Diagnosis not present

## 2019-12-06 DIAGNOSIS — Z992 Dependence on renal dialysis: Secondary | ICD-10-CM | POA: Diagnosis not present

## 2019-12-06 DIAGNOSIS — N250 Renal osteodystrophy: Secondary | ICD-10-CM | POA: Diagnosis not present

## 2019-12-06 DIAGNOSIS — N186 End stage renal disease: Secondary | ICD-10-CM | POA: Diagnosis not present

## 2019-12-06 DIAGNOSIS — D631 Anemia in chronic kidney disease: Secondary | ICD-10-CM | POA: Diagnosis not present

## 2019-12-06 DIAGNOSIS — R110 Nausea: Secondary | ICD-10-CM | POA: Diagnosis not present

## 2019-12-06 DIAGNOSIS — D509 Iron deficiency anemia, unspecified: Secondary | ICD-10-CM | POA: Diagnosis not present

## 2019-12-08 DIAGNOSIS — R110 Nausea: Secondary | ICD-10-CM | POA: Diagnosis not present

## 2019-12-08 DIAGNOSIS — D509 Iron deficiency anemia, unspecified: Secondary | ICD-10-CM | POA: Diagnosis not present

## 2019-12-08 DIAGNOSIS — D631 Anemia in chronic kidney disease: Secondary | ICD-10-CM | POA: Diagnosis not present

## 2019-12-08 DIAGNOSIS — N250 Renal osteodystrophy: Secondary | ICD-10-CM | POA: Diagnosis not present

## 2019-12-08 DIAGNOSIS — N186 End stage renal disease: Secondary | ICD-10-CM | POA: Diagnosis not present

## 2019-12-08 DIAGNOSIS — Z992 Dependence on renal dialysis: Secondary | ICD-10-CM | POA: Diagnosis not present

## 2019-12-09 DIAGNOSIS — N186 End stage renal disease: Secondary | ICD-10-CM | POA: Diagnosis not present

## 2019-12-09 DIAGNOSIS — Z992 Dependence on renal dialysis: Secondary | ICD-10-CM | POA: Diagnosis not present

## 2019-12-11 DIAGNOSIS — Z23 Encounter for immunization: Secondary | ICD-10-CM | POA: Diagnosis not present

## 2019-12-11 DIAGNOSIS — N250 Renal osteodystrophy: Secondary | ICD-10-CM | POA: Diagnosis not present

## 2019-12-11 DIAGNOSIS — Z992 Dependence on renal dialysis: Secondary | ICD-10-CM | POA: Diagnosis not present

## 2019-12-11 DIAGNOSIS — N186 End stage renal disease: Secondary | ICD-10-CM | POA: Diagnosis not present

## 2019-12-11 DIAGNOSIS — D631 Anemia in chronic kidney disease: Secondary | ICD-10-CM | POA: Diagnosis not present

## 2019-12-11 DIAGNOSIS — D509 Iron deficiency anemia, unspecified: Secondary | ICD-10-CM | POA: Diagnosis not present

## 2019-12-11 DIAGNOSIS — R110 Nausea: Secondary | ICD-10-CM | POA: Diagnosis not present

## 2019-12-13 DIAGNOSIS — N186 End stage renal disease: Secondary | ICD-10-CM | POA: Diagnosis not present

## 2019-12-13 DIAGNOSIS — D509 Iron deficiency anemia, unspecified: Secondary | ICD-10-CM | POA: Diagnosis not present

## 2019-12-13 DIAGNOSIS — N250 Renal osteodystrophy: Secondary | ICD-10-CM | POA: Diagnosis not present

## 2019-12-13 DIAGNOSIS — Z992 Dependence on renal dialysis: Secondary | ICD-10-CM | POA: Diagnosis not present

## 2019-12-13 DIAGNOSIS — Z23 Encounter for immunization: Secondary | ICD-10-CM | POA: Diagnosis not present

## 2019-12-13 DIAGNOSIS — R110 Nausea: Secondary | ICD-10-CM | POA: Diagnosis not present

## 2019-12-15 DIAGNOSIS — N250 Renal osteodystrophy: Secondary | ICD-10-CM | POA: Diagnosis not present

## 2019-12-15 DIAGNOSIS — D509 Iron deficiency anemia, unspecified: Secondary | ICD-10-CM | POA: Diagnosis not present

## 2019-12-15 DIAGNOSIS — Z23 Encounter for immunization: Secondary | ICD-10-CM | POA: Diagnosis not present

## 2019-12-15 DIAGNOSIS — R110 Nausea: Secondary | ICD-10-CM | POA: Diagnosis not present

## 2019-12-15 DIAGNOSIS — Z992 Dependence on renal dialysis: Secondary | ICD-10-CM | POA: Diagnosis not present

## 2019-12-15 DIAGNOSIS — N186 End stage renal disease: Secondary | ICD-10-CM | POA: Diagnosis not present

## 2019-12-19 DIAGNOSIS — N186 End stage renal disease: Secondary | ICD-10-CM | POA: Diagnosis not present

## 2019-12-19 DIAGNOSIS — R110 Nausea: Secondary | ICD-10-CM | POA: Diagnosis not present

## 2019-12-19 DIAGNOSIS — N250 Renal osteodystrophy: Secondary | ICD-10-CM | POA: Diagnosis not present

## 2019-12-19 DIAGNOSIS — Z992 Dependence on renal dialysis: Secondary | ICD-10-CM | POA: Diagnosis not present

## 2019-12-19 DIAGNOSIS — Z23 Encounter for immunization: Secondary | ICD-10-CM | POA: Diagnosis not present

## 2019-12-19 DIAGNOSIS — D509 Iron deficiency anemia, unspecified: Secondary | ICD-10-CM | POA: Diagnosis not present

## 2019-12-20 DIAGNOSIS — D509 Iron deficiency anemia, unspecified: Secondary | ICD-10-CM | POA: Diagnosis not present

## 2019-12-20 DIAGNOSIS — Z23 Encounter for immunization: Secondary | ICD-10-CM | POA: Diagnosis not present

## 2019-12-20 DIAGNOSIS — Z992 Dependence on renal dialysis: Secondary | ICD-10-CM | POA: Diagnosis not present

## 2019-12-20 DIAGNOSIS — N186 End stage renal disease: Secondary | ICD-10-CM | POA: Diagnosis not present

## 2019-12-20 DIAGNOSIS — R110 Nausea: Secondary | ICD-10-CM | POA: Diagnosis not present

## 2019-12-20 DIAGNOSIS — N250 Renal osteodystrophy: Secondary | ICD-10-CM | POA: Diagnosis not present

## 2019-12-22 DIAGNOSIS — N186 End stage renal disease: Secondary | ICD-10-CM | POA: Diagnosis not present

## 2019-12-22 DIAGNOSIS — Z992 Dependence on renal dialysis: Secondary | ICD-10-CM | POA: Diagnosis not present

## 2019-12-22 DIAGNOSIS — R110 Nausea: Secondary | ICD-10-CM | POA: Diagnosis not present

## 2019-12-22 DIAGNOSIS — N250 Renal osteodystrophy: Secondary | ICD-10-CM | POA: Diagnosis not present

## 2019-12-22 DIAGNOSIS — Z23 Encounter for immunization: Secondary | ICD-10-CM | POA: Diagnosis not present

## 2019-12-22 DIAGNOSIS — D509 Iron deficiency anemia, unspecified: Secondary | ICD-10-CM | POA: Diagnosis not present

## 2019-12-25 DIAGNOSIS — I10 Essential (primary) hypertension: Secondary | ICD-10-CM | POA: Diagnosis not present

## 2019-12-25 DIAGNOSIS — N186 End stage renal disease: Secondary | ICD-10-CM | POA: Diagnosis not present

## 2019-12-25 DIAGNOSIS — D509 Iron deficiency anemia, unspecified: Secondary | ICD-10-CM | POA: Diagnosis not present

## 2019-12-25 DIAGNOSIS — R110 Nausea: Secondary | ICD-10-CM | POA: Diagnosis not present

## 2019-12-25 DIAGNOSIS — N250 Renal osteodystrophy: Secondary | ICD-10-CM | POA: Diagnosis not present

## 2019-12-25 DIAGNOSIS — Z23 Encounter for immunization: Secondary | ICD-10-CM | POA: Diagnosis not present

## 2019-12-25 DIAGNOSIS — Z992 Dependence on renal dialysis: Secondary | ICD-10-CM | POA: Diagnosis not present

## 2019-12-25 DIAGNOSIS — E7800 Pure hypercholesterolemia, unspecified: Secondary | ICD-10-CM | POA: Diagnosis not present

## 2019-12-28 DIAGNOSIS — N250 Renal osteodystrophy: Secondary | ICD-10-CM | POA: Diagnosis not present

## 2019-12-28 DIAGNOSIS — Z992 Dependence on renal dialysis: Secondary | ICD-10-CM | POA: Diagnosis not present

## 2019-12-28 DIAGNOSIS — Z23 Encounter for immunization: Secondary | ICD-10-CM | POA: Diagnosis not present

## 2019-12-28 DIAGNOSIS — D509 Iron deficiency anemia, unspecified: Secondary | ICD-10-CM | POA: Diagnosis not present

## 2019-12-28 DIAGNOSIS — N186 End stage renal disease: Secondary | ICD-10-CM | POA: Diagnosis not present

## 2019-12-28 DIAGNOSIS — R110 Nausea: Secondary | ICD-10-CM | POA: Diagnosis not present

## 2019-12-29 DIAGNOSIS — D509 Iron deficiency anemia, unspecified: Secondary | ICD-10-CM | POA: Diagnosis not present

## 2019-12-29 DIAGNOSIS — R110 Nausea: Secondary | ICD-10-CM | POA: Diagnosis not present

## 2019-12-29 DIAGNOSIS — N250 Renal osteodystrophy: Secondary | ICD-10-CM | POA: Diagnosis not present

## 2019-12-29 DIAGNOSIS — Z992 Dependence on renal dialysis: Secondary | ICD-10-CM | POA: Diagnosis not present

## 2019-12-29 DIAGNOSIS — Z23 Encounter for immunization: Secondary | ICD-10-CM | POA: Diagnosis not present

## 2019-12-29 DIAGNOSIS — N186 End stage renal disease: Secondary | ICD-10-CM | POA: Diagnosis not present

## 2019-12-31 DIAGNOSIS — Z941 Heart transplant status: Secondary | ICD-10-CM | POA: Diagnosis not present

## 2019-12-31 DIAGNOSIS — N185 Chronic kidney disease, stage 5: Secondary | ICD-10-CM | POA: Diagnosis not present

## 2019-12-31 DIAGNOSIS — I10 Essential (primary) hypertension: Secondary | ICD-10-CM | POA: Diagnosis not present

## 2019-12-31 DIAGNOSIS — Z299 Encounter for prophylactic measures, unspecified: Secondary | ICD-10-CM | POA: Diagnosis not present

## 2019-12-31 DIAGNOSIS — G629 Polyneuropathy, unspecified: Secondary | ICD-10-CM | POA: Diagnosis not present

## 2019-12-31 DIAGNOSIS — E7800 Pure hypercholesterolemia, unspecified: Secondary | ICD-10-CM | POA: Diagnosis not present

## 2019-12-31 DIAGNOSIS — Z6821 Body mass index (BMI) 21.0-21.9, adult: Secondary | ICD-10-CM | POA: Diagnosis not present

## 2019-12-31 DIAGNOSIS — Z2821 Immunization not carried out because of patient refusal: Secondary | ICD-10-CM | POA: Diagnosis not present

## 2020-01-01 DIAGNOSIS — N250 Renal osteodystrophy: Secondary | ICD-10-CM | POA: Diagnosis not present

## 2020-01-01 DIAGNOSIS — D509 Iron deficiency anemia, unspecified: Secondary | ICD-10-CM | POA: Diagnosis not present

## 2020-01-01 DIAGNOSIS — Z992 Dependence on renal dialysis: Secondary | ICD-10-CM | POA: Diagnosis not present

## 2020-01-01 DIAGNOSIS — N186 End stage renal disease: Secondary | ICD-10-CM | POA: Diagnosis not present

## 2020-01-01 DIAGNOSIS — R110 Nausea: Secondary | ICD-10-CM | POA: Diagnosis not present

## 2020-01-01 DIAGNOSIS — Z23 Encounter for immunization: Secondary | ICD-10-CM | POA: Diagnosis not present

## 2020-01-03 DIAGNOSIS — Z23 Encounter for immunization: Secondary | ICD-10-CM | POA: Diagnosis not present

## 2020-01-03 DIAGNOSIS — R110 Nausea: Secondary | ICD-10-CM | POA: Diagnosis not present

## 2020-01-03 DIAGNOSIS — N186 End stage renal disease: Secondary | ICD-10-CM | POA: Diagnosis not present

## 2020-01-03 DIAGNOSIS — Z992 Dependence on renal dialysis: Secondary | ICD-10-CM | POA: Diagnosis not present

## 2020-01-03 DIAGNOSIS — N250 Renal osteodystrophy: Secondary | ICD-10-CM | POA: Diagnosis not present

## 2020-01-03 DIAGNOSIS — D509 Iron deficiency anemia, unspecified: Secondary | ICD-10-CM | POA: Diagnosis not present

## 2020-01-05 DIAGNOSIS — R110 Nausea: Secondary | ICD-10-CM | POA: Diagnosis not present

## 2020-01-05 DIAGNOSIS — D509 Iron deficiency anemia, unspecified: Secondary | ICD-10-CM | POA: Diagnosis not present

## 2020-01-05 DIAGNOSIS — N250 Renal osteodystrophy: Secondary | ICD-10-CM | POA: Diagnosis not present

## 2020-01-05 DIAGNOSIS — Z23 Encounter for immunization: Secondary | ICD-10-CM | POA: Diagnosis not present

## 2020-01-05 DIAGNOSIS — N186 End stage renal disease: Secondary | ICD-10-CM | POA: Diagnosis not present

## 2020-01-05 DIAGNOSIS — Z992 Dependence on renal dialysis: Secondary | ICD-10-CM | POA: Diagnosis not present

## 2020-01-08 DIAGNOSIS — Z992 Dependence on renal dialysis: Secondary | ICD-10-CM | POA: Diagnosis not present

## 2020-01-08 DIAGNOSIS — N186 End stage renal disease: Secondary | ICD-10-CM | POA: Diagnosis not present

## 2020-01-08 DIAGNOSIS — D631 Anemia in chronic kidney disease: Secondary | ICD-10-CM | POA: Diagnosis not present

## 2020-01-08 DIAGNOSIS — D509 Iron deficiency anemia, unspecified: Secondary | ICD-10-CM | POA: Diagnosis not present

## 2020-01-08 DIAGNOSIS — N250 Renal osteodystrophy: Secondary | ICD-10-CM | POA: Diagnosis not present

## 2020-01-08 DIAGNOSIS — R110 Nausea: Secondary | ICD-10-CM | POA: Diagnosis not present

## 2020-01-08 DIAGNOSIS — Z23 Encounter for immunization: Secondary | ICD-10-CM | POA: Diagnosis not present

## 2020-01-10 DIAGNOSIS — Z23 Encounter for immunization: Secondary | ICD-10-CM | POA: Diagnosis not present

## 2020-01-10 DIAGNOSIS — N250 Renal osteodystrophy: Secondary | ICD-10-CM | POA: Diagnosis not present

## 2020-01-10 DIAGNOSIS — N186 End stage renal disease: Secondary | ICD-10-CM | POA: Diagnosis not present

## 2020-01-10 DIAGNOSIS — Z992 Dependence on renal dialysis: Secondary | ICD-10-CM | POA: Diagnosis not present

## 2020-01-10 DIAGNOSIS — D509 Iron deficiency anemia, unspecified: Secondary | ICD-10-CM | POA: Diagnosis not present

## 2020-01-10 DIAGNOSIS — R110 Nausea: Secondary | ICD-10-CM | POA: Diagnosis not present

## 2020-01-14 DIAGNOSIS — Z950 Presence of cardiac pacemaker: Secondary | ICD-10-CM | POA: Diagnosis not present

## 2020-01-14 DIAGNOSIS — Z23 Encounter for immunization: Secondary | ICD-10-CM | POA: Diagnosis not present

## 2020-01-14 DIAGNOSIS — I442 Atrioventricular block, complete: Secondary | ICD-10-CM | POA: Diagnosis not present

## 2020-01-15 DIAGNOSIS — N186 End stage renal disease: Secondary | ICD-10-CM | POA: Diagnosis not present

## 2020-01-15 DIAGNOSIS — N250 Renal osteodystrophy: Secondary | ICD-10-CM | POA: Diagnosis not present

## 2020-01-15 DIAGNOSIS — R110 Nausea: Secondary | ICD-10-CM | POA: Diagnosis not present

## 2020-01-15 DIAGNOSIS — Z992 Dependence on renal dialysis: Secondary | ICD-10-CM | POA: Diagnosis not present

## 2020-01-15 DIAGNOSIS — Z23 Encounter for immunization: Secondary | ICD-10-CM | POA: Diagnosis not present

## 2020-01-15 DIAGNOSIS — D509 Iron deficiency anemia, unspecified: Secondary | ICD-10-CM | POA: Diagnosis not present

## 2020-01-17 DIAGNOSIS — N250 Renal osteodystrophy: Secondary | ICD-10-CM | POA: Diagnosis not present

## 2020-01-17 DIAGNOSIS — Z23 Encounter for immunization: Secondary | ICD-10-CM | POA: Diagnosis not present

## 2020-01-17 DIAGNOSIS — R110 Nausea: Secondary | ICD-10-CM | POA: Diagnosis not present

## 2020-01-17 DIAGNOSIS — N186 End stage renal disease: Secondary | ICD-10-CM | POA: Diagnosis not present

## 2020-01-17 DIAGNOSIS — Z992 Dependence on renal dialysis: Secondary | ICD-10-CM | POA: Diagnosis not present

## 2020-01-17 DIAGNOSIS — D509 Iron deficiency anemia, unspecified: Secondary | ICD-10-CM | POA: Diagnosis not present

## 2020-01-19 DIAGNOSIS — N250 Renal osteodystrophy: Secondary | ICD-10-CM | POA: Diagnosis not present

## 2020-01-19 DIAGNOSIS — Z23 Encounter for immunization: Secondary | ICD-10-CM | POA: Diagnosis not present

## 2020-01-19 DIAGNOSIS — D509 Iron deficiency anemia, unspecified: Secondary | ICD-10-CM | POA: Diagnosis not present

## 2020-01-19 DIAGNOSIS — N186 End stage renal disease: Secondary | ICD-10-CM | POA: Diagnosis not present

## 2020-01-19 DIAGNOSIS — Z992 Dependence on renal dialysis: Secondary | ICD-10-CM | POA: Diagnosis not present

## 2020-01-19 DIAGNOSIS — R110 Nausea: Secondary | ICD-10-CM | POA: Diagnosis not present

## 2020-01-21 DIAGNOSIS — E7800 Pure hypercholesterolemia, unspecified: Secondary | ICD-10-CM | POA: Diagnosis not present

## 2020-01-21 DIAGNOSIS — I10 Essential (primary) hypertension: Secondary | ICD-10-CM | POA: Diagnosis not present

## 2020-01-23 DIAGNOSIS — Z23 Encounter for immunization: Secondary | ICD-10-CM | POA: Diagnosis not present

## 2020-01-23 DIAGNOSIS — R110 Nausea: Secondary | ICD-10-CM | POA: Diagnosis not present

## 2020-01-23 DIAGNOSIS — Z992 Dependence on renal dialysis: Secondary | ICD-10-CM | POA: Diagnosis not present

## 2020-01-23 DIAGNOSIS — N186 End stage renal disease: Secondary | ICD-10-CM | POA: Diagnosis not present

## 2020-01-23 DIAGNOSIS — N250 Renal osteodystrophy: Secondary | ICD-10-CM | POA: Diagnosis not present

## 2020-01-23 DIAGNOSIS — D509 Iron deficiency anemia, unspecified: Secondary | ICD-10-CM | POA: Diagnosis not present

## 2020-01-24 DIAGNOSIS — N250 Renal osteodystrophy: Secondary | ICD-10-CM | POA: Diagnosis not present

## 2020-01-24 DIAGNOSIS — Z23 Encounter for immunization: Secondary | ICD-10-CM | POA: Diagnosis not present

## 2020-01-24 DIAGNOSIS — D509 Iron deficiency anemia, unspecified: Secondary | ICD-10-CM | POA: Diagnosis not present

## 2020-01-24 DIAGNOSIS — Z992 Dependence on renal dialysis: Secondary | ICD-10-CM | POA: Diagnosis not present

## 2020-01-24 DIAGNOSIS — R110 Nausea: Secondary | ICD-10-CM | POA: Diagnosis not present

## 2020-01-24 DIAGNOSIS — N186 End stage renal disease: Secondary | ICD-10-CM | POA: Diagnosis not present

## 2020-01-26 DIAGNOSIS — R110 Nausea: Secondary | ICD-10-CM | POA: Diagnosis not present

## 2020-01-26 DIAGNOSIS — Z992 Dependence on renal dialysis: Secondary | ICD-10-CM | POA: Diagnosis not present

## 2020-01-26 DIAGNOSIS — D509 Iron deficiency anemia, unspecified: Secondary | ICD-10-CM | POA: Diagnosis not present

## 2020-01-26 DIAGNOSIS — Z23 Encounter for immunization: Secondary | ICD-10-CM | POA: Diagnosis not present

## 2020-01-26 DIAGNOSIS — N250 Renal osteodystrophy: Secondary | ICD-10-CM | POA: Diagnosis not present

## 2020-01-26 DIAGNOSIS — N186 End stage renal disease: Secondary | ICD-10-CM | POA: Diagnosis not present

## 2020-01-30 DIAGNOSIS — N186 End stage renal disease: Secondary | ICD-10-CM | POA: Diagnosis not present

## 2020-01-30 DIAGNOSIS — I259 Chronic ischemic heart disease, unspecified: Secondary | ICD-10-CM | POA: Diagnosis not present

## 2020-01-30 DIAGNOSIS — R110 Nausea: Secondary | ICD-10-CM | POA: Diagnosis not present

## 2020-01-30 DIAGNOSIS — N250 Renal osteodystrophy: Secondary | ICD-10-CM | POA: Diagnosis not present

## 2020-01-30 DIAGNOSIS — Z992 Dependence on renal dialysis: Secondary | ICD-10-CM | POA: Diagnosis not present

## 2020-01-30 DIAGNOSIS — Z23 Encounter for immunization: Secondary | ICD-10-CM | POA: Diagnosis not present

## 2020-01-30 DIAGNOSIS — D509 Iron deficiency anemia, unspecified: Secondary | ICD-10-CM | POA: Diagnosis not present

## 2020-01-31 DIAGNOSIS — N186 End stage renal disease: Secondary | ICD-10-CM | POA: Diagnosis not present

## 2020-01-31 DIAGNOSIS — N250 Renal osteodystrophy: Secondary | ICD-10-CM | POA: Diagnosis not present

## 2020-01-31 DIAGNOSIS — R110 Nausea: Secondary | ICD-10-CM | POA: Diagnosis not present

## 2020-01-31 DIAGNOSIS — D509 Iron deficiency anemia, unspecified: Secondary | ICD-10-CM | POA: Diagnosis not present

## 2020-01-31 DIAGNOSIS — Z992 Dependence on renal dialysis: Secondary | ICD-10-CM | POA: Diagnosis not present

## 2020-01-31 DIAGNOSIS — Z23 Encounter for immunization: Secondary | ICD-10-CM | POA: Diagnosis not present

## 2020-02-02 DIAGNOSIS — N250 Renal osteodystrophy: Secondary | ICD-10-CM | POA: Diagnosis not present

## 2020-02-02 DIAGNOSIS — Z992 Dependence on renal dialysis: Secondary | ICD-10-CM | POA: Diagnosis not present

## 2020-02-02 DIAGNOSIS — Z23 Encounter for immunization: Secondary | ICD-10-CM | POA: Diagnosis not present

## 2020-02-02 DIAGNOSIS — R110 Nausea: Secondary | ICD-10-CM | POA: Diagnosis not present

## 2020-02-02 DIAGNOSIS — N186 End stage renal disease: Secondary | ICD-10-CM | POA: Diagnosis not present

## 2020-02-02 DIAGNOSIS — D509 Iron deficiency anemia, unspecified: Secondary | ICD-10-CM | POA: Diagnosis not present

## 2020-02-05 DIAGNOSIS — D509 Iron deficiency anemia, unspecified: Secondary | ICD-10-CM | POA: Diagnosis not present

## 2020-02-05 DIAGNOSIS — N250 Renal osteodystrophy: Secondary | ICD-10-CM | POA: Diagnosis not present

## 2020-02-05 DIAGNOSIS — R110 Nausea: Secondary | ICD-10-CM | POA: Diagnosis not present

## 2020-02-05 DIAGNOSIS — Z992 Dependence on renal dialysis: Secondary | ICD-10-CM | POA: Diagnosis not present

## 2020-02-05 DIAGNOSIS — Z23 Encounter for immunization: Secondary | ICD-10-CM | POA: Diagnosis not present

## 2020-02-05 DIAGNOSIS — N186 End stage renal disease: Secondary | ICD-10-CM | POA: Diagnosis not present

## 2020-02-07 DIAGNOSIS — D631 Anemia in chronic kidney disease: Secondary | ICD-10-CM | POA: Diagnosis not present

## 2020-02-07 DIAGNOSIS — N186 End stage renal disease: Secondary | ICD-10-CM | POA: Diagnosis not present

## 2020-02-07 DIAGNOSIS — D509 Iron deficiency anemia, unspecified: Secondary | ICD-10-CM | POA: Diagnosis not present

## 2020-02-07 DIAGNOSIS — R110 Nausea: Secondary | ICD-10-CM | POA: Diagnosis not present

## 2020-02-07 DIAGNOSIS — Z23 Encounter for immunization: Secondary | ICD-10-CM | POA: Diagnosis not present

## 2020-02-07 DIAGNOSIS — N250 Renal osteodystrophy: Secondary | ICD-10-CM | POA: Diagnosis not present

## 2020-02-07 DIAGNOSIS — Z992 Dependence on renal dialysis: Secondary | ICD-10-CM | POA: Diagnosis not present

## 2020-02-09 DIAGNOSIS — Z992 Dependence on renal dialysis: Secondary | ICD-10-CM | POA: Diagnosis not present

## 2020-02-09 DIAGNOSIS — N186 End stage renal disease: Secondary | ICD-10-CM | POA: Diagnosis not present

## 2020-02-09 DIAGNOSIS — N250 Renal osteodystrophy: Secondary | ICD-10-CM | POA: Diagnosis not present

## 2020-02-09 DIAGNOSIS — Z23 Encounter for immunization: Secondary | ICD-10-CM | POA: Diagnosis not present

## 2020-02-09 DIAGNOSIS — R110 Nausea: Secondary | ICD-10-CM | POA: Diagnosis not present

## 2020-02-09 DIAGNOSIS — D509 Iron deficiency anemia, unspecified: Secondary | ICD-10-CM | POA: Diagnosis not present

## 2020-02-12 DIAGNOSIS — Z992 Dependence on renal dialysis: Secondary | ICD-10-CM | POA: Diagnosis not present

## 2020-02-12 DIAGNOSIS — N186 End stage renal disease: Secondary | ICD-10-CM | POA: Diagnosis not present

## 2020-02-12 DIAGNOSIS — R1030 Lower abdominal pain, unspecified: Secondary | ICD-10-CM | POA: Diagnosis not present

## 2020-02-12 DIAGNOSIS — N250 Renal osteodystrophy: Secondary | ICD-10-CM | POA: Diagnosis not present

## 2020-02-12 DIAGNOSIS — Z23 Encounter for immunization: Secondary | ICD-10-CM | POA: Diagnosis not present

## 2020-02-12 DIAGNOSIS — D509 Iron deficiency anemia, unspecified: Secondary | ICD-10-CM | POA: Diagnosis not present

## 2020-02-12 DIAGNOSIS — R110 Nausea: Secondary | ICD-10-CM | POA: Diagnosis not present

## 2020-02-13 DIAGNOSIS — Z23 Encounter for immunization: Secondary | ICD-10-CM | POA: Diagnosis not present

## 2020-02-14 DIAGNOSIS — Z941 Heart transplant status: Secondary | ICD-10-CM | POA: Diagnosis not present

## 2020-02-14 DIAGNOSIS — Z950 Presence of cardiac pacemaker: Secondary | ICD-10-CM | POA: Diagnosis not present

## 2020-02-14 DIAGNOSIS — Z23 Encounter for immunization: Secondary | ICD-10-CM | POA: Diagnosis not present

## 2020-02-14 DIAGNOSIS — N250 Renal osteodystrophy: Secondary | ICD-10-CM | POA: Diagnosis not present

## 2020-02-14 DIAGNOSIS — R9431 Abnormal electrocardiogram [ECG] [EKG]: Secondary | ICD-10-CM | POA: Diagnosis not present

## 2020-02-14 DIAGNOSIS — R110 Nausea: Secondary | ICD-10-CM | POA: Diagnosis not present

## 2020-02-14 DIAGNOSIS — D509 Iron deficiency anemia, unspecified: Secondary | ICD-10-CM | POA: Diagnosis not present

## 2020-02-14 DIAGNOSIS — N186 End stage renal disease: Secondary | ICD-10-CM | POA: Diagnosis not present

## 2020-02-14 DIAGNOSIS — Z992 Dependence on renal dialysis: Secondary | ICD-10-CM | POA: Diagnosis not present

## 2020-02-14 DIAGNOSIS — F17210 Nicotine dependence, cigarettes, uncomplicated: Secondary | ICD-10-CM | POA: Diagnosis not present

## 2020-02-14 DIAGNOSIS — Z952 Presence of prosthetic heart valve: Secondary | ICD-10-CM | POA: Diagnosis not present

## 2020-02-16 DIAGNOSIS — R110 Nausea: Secondary | ICD-10-CM | POA: Diagnosis not present

## 2020-02-16 DIAGNOSIS — N250 Renal osteodystrophy: Secondary | ICD-10-CM | POA: Diagnosis not present

## 2020-02-16 DIAGNOSIS — Z992 Dependence on renal dialysis: Secondary | ICD-10-CM | POA: Diagnosis not present

## 2020-02-16 DIAGNOSIS — D509 Iron deficiency anemia, unspecified: Secondary | ICD-10-CM | POA: Diagnosis not present

## 2020-02-16 DIAGNOSIS — Z23 Encounter for immunization: Secondary | ICD-10-CM | POA: Diagnosis not present

## 2020-02-16 DIAGNOSIS — N186 End stage renal disease: Secondary | ICD-10-CM | POA: Diagnosis not present

## 2020-02-19 DIAGNOSIS — E7800 Pure hypercholesterolemia, unspecified: Secondary | ICD-10-CM | POA: Diagnosis not present

## 2020-02-19 DIAGNOSIS — Z23 Encounter for immunization: Secondary | ICD-10-CM | POA: Diagnosis not present

## 2020-02-19 DIAGNOSIS — N186 End stage renal disease: Secondary | ICD-10-CM | POA: Diagnosis not present

## 2020-02-19 DIAGNOSIS — D509 Iron deficiency anemia, unspecified: Secondary | ICD-10-CM | POA: Diagnosis not present

## 2020-02-19 DIAGNOSIS — R110 Nausea: Secondary | ICD-10-CM | POA: Diagnosis not present

## 2020-02-19 DIAGNOSIS — N250 Renal osteodystrophy: Secondary | ICD-10-CM | POA: Diagnosis not present

## 2020-02-19 DIAGNOSIS — Z992 Dependence on renal dialysis: Secondary | ICD-10-CM | POA: Diagnosis not present

## 2020-02-19 DIAGNOSIS — I10 Essential (primary) hypertension: Secondary | ICD-10-CM | POA: Diagnosis not present

## 2020-02-23 DIAGNOSIS — R531 Weakness: Secondary | ICD-10-CM | POA: Diagnosis not present

## 2020-02-23 DIAGNOSIS — Z992 Dependence on renal dialysis: Secondary | ICD-10-CM | POA: Diagnosis not present

## 2020-02-23 DIAGNOSIS — K219 Gastro-esophageal reflux disease without esophagitis: Secondary | ICD-10-CM | POA: Diagnosis not present

## 2020-02-23 DIAGNOSIS — Z941 Heart transplant status: Secondary | ICD-10-CM | POA: Diagnosis not present

## 2020-02-23 DIAGNOSIS — Z23 Encounter for immunization: Secondary | ICD-10-CM | POA: Diagnosis not present

## 2020-02-23 DIAGNOSIS — Z743 Need for continuous supervision: Secondary | ICD-10-CM | POA: Diagnosis not present

## 2020-02-23 DIAGNOSIS — I509 Heart failure, unspecified: Secondary | ICD-10-CM | POA: Diagnosis not present

## 2020-02-23 DIAGNOSIS — E7800 Pure hypercholesterolemia, unspecified: Secondary | ICD-10-CM | POA: Diagnosis not present

## 2020-02-23 DIAGNOSIS — N186 End stage renal disease: Secondary | ICD-10-CM | POA: Diagnosis not present

## 2020-02-23 DIAGNOSIS — N189 Chronic kidney disease, unspecified: Secondary | ICD-10-CM | POA: Diagnosis not present

## 2020-02-23 DIAGNOSIS — N250 Renal osteodystrophy: Secondary | ICD-10-CM | POA: Diagnosis not present

## 2020-02-23 DIAGNOSIS — R110 Nausea: Secondary | ICD-10-CM | POA: Diagnosis not present

## 2020-02-23 DIAGNOSIS — R109 Unspecified abdominal pain: Secondary | ICD-10-CM | POA: Diagnosis not present

## 2020-02-23 DIAGNOSIS — I130 Hypertensive heart and chronic kidney disease with heart failure and stage 1 through stage 4 chronic kidney disease, or unspecified chronic kidney disease: Secondary | ICD-10-CM | POA: Diagnosis not present

## 2020-02-23 DIAGNOSIS — Z79899 Other long term (current) drug therapy: Secondary | ICD-10-CM | POA: Diagnosis not present

## 2020-02-23 DIAGNOSIS — D509 Iron deficiency anemia, unspecified: Secondary | ICD-10-CM | POA: Diagnosis not present

## 2020-02-23 DIAGNOSIS — Z8639 Personal history of other endocrine, nutritional and metabolic disease: Secondary | ICD-10-CM | POA: Diagnosis not present

## 2020-02-23 DIAGNOSIS — Z7982 Long term (current) use of aspirin: Secondary | ICD-10-CM | POA: Diagnosis not present

## 2020-02-23 DIAGNOSIS — R1030 Lower abdominal pain, unspecified: Secondary | ICD-10-CM | POA: Diagnosis not present

## 2020-02-23 DIAGNOSIS — R1084 Generalized abdominal pain: Secondary | ICD-10-CM | POA: Diagnosis not present

## 2020-02-23 DIAGNOSIS — R52 Pain, unspecified: Secondary | ICD-10-CM | POA: Diagnosis not present

## 2020-02-23 DIAGNOSIS — Z87891 Personal history of nicotine dependence: Secondary | ICD-10-CM | POA: Diagnosis not present

## 2020-02-26 DIAGNOSIS — R110 Nausea: Secondary | ICD-10-CM | POA: Diagnosis not present

## 2020-02-26 DIAGNOSIS — Z992 Dependence on renal dialysis: Secondary | ICD-10-CM | POA: Diagnosis not present

## 2020-02-26 DIAGNOSIS — Z23 Encounter for immunization: Secondary | ICD-10-CM | POA: Diagnosis not present

## 2020-02-26 DIAGNOSIS — D509 Iron deficiency anemia, unspecified: Secondary | ICD-10-CM | POA: Diagnosis not present

## 2020-02-26 DIAGNOSIS — N250 Renal osteodystrophy: Secondary | ICD-10-CM | POA: Diagnosis not present

## 2020-02-26 DIAGNOSIS — N186 End stage renal disease: Secondary | ICD-10-CM | POA: Diagnosis not present

## 2020-02-29 DIAGNOSIS — H53002 Unspecified amblyopia, left eye: Secondary | ICD-10-CM | POA: Diagnosis not present

## 2020-02-29 DIAGNOSIS — Z992 Dependence on renal dialysis: Secondary | ICD-10-CM | POA: Diagnosis not present

## 2020-02-29 DIAGNOSIS — D631 Anemia in chronic kidney disease: Secondary | ICD-10-CM | POA: Diagnosis not present

## 2020-02-29 DIAGNOSIS — H5203 Hypermetropia, bilateral: Secondary | ICD-10-CM | POA: Diagnosis not present

## 2020-02-29 DIAGNOSIS — N186 End stage renal disease: Secondary | ICD-10-CM | POA: Diagnosis not present

## 2020-02-29 DIAGNOSIS — H524 Presbyopia: Secondary | ICD-10-CM | POA: Diagnosis not present

## 2020-02-29 DIAGNOSIS — I120 Hypertensive chronic kidney disease with stage 5 chronic kidney disease or end stage renal disease: Secondary | ICD-10-CM | POA: Diagnosis not present

## 2020-02-29 DIAGNOSIS — Z20822 Contact with and (suspected) exposure to covid-19: Secondary | ICD-10-CM | POA: Diagnosis not present

## 2020-03-01 DIAGNOSIS — Z23 Encounter for immunization: Secondary | ICD-10-CM | POA: Diagnosis not present

## 2020-03-01 DIAGNOSIS — D509 Iron deficiency anemia, unspecified: Secondary | ICD-10-CM | POA: Diagnosis not present

## 2020-03-01 DIAGNOSIS — R110 Nausea: Secondary | ICD-10-CM | POA: Diagnosis not present

## 2020-03-01 DIAGNOSIS — Z992 Dependence on renal dialysis: Secondary | ICD-10-CM | POA: Diagnosis not present

## 2020-03-01 DIAGNOSIS — N250 Renal osteodystrophy: Secondary | ICD-10-CM | POA: Diagnosis not present

## 2020-03-01 DIAGNOSIS — N186 End stage renal disease: Secondary | ICD-10-CM | POA: Diagnosis not present

## 2020-03-04 DIAGNOSIS — Z23 Encounter for immunization: Secondary | ICD-10-CM | POA: Diagnosis not present

## 2020-03-04 DIAGNOSIS — N186 End stage renal disease: Secondary | ICD-10-CM | POA: Diagnosis not present

## 2020-03-04 DIAGNOSIS — D509 Iron deficiency anemia, unspecified: Secondary | ICD-10-CM | POA: Diagnosis not present

## 2020-03-04 DIAGNOSIS — Z992 Dependence on renal dialysis: Secondary | ICD-10-CM | POA: Diagnosis not present

## 2020-03-04 DIAGNOSIS — R110 Nausea: Secondary | ICD-10-CM | POA: Diagnosis not present

## 2020-03-04 DIAGNOSIS — N250 Renal osteodystrophy: Secondary | ICD-10-CM | POA: Diagnosis not present

## 2020-03-07 DIAGNOSIS — Z992 Dependence on renal dialysis: Secondary | ICD-10-CM | POA: Diagnosis not present

## 2020-03-07 DIAGNOSIS — N186 End stage renal disease: Secondary | ICD-10-CM | POA: Diagnosis not present

## 2020-03-08 DIAGNOSIS — D631 Anemia in chronic kidney disease: Secondary | ICD-10-CM | POA: Diagnosis not present

## 2020-03-08 DIAGNOSIS — Z992 Dependence on renal dialysis: Secondary | ICD-10-CM | POA: Diagnosis not present

## 2020-03-08 DIAGNOSIS — N186 End stage renal disease: Secondary | ICD-10-CM | POA: Diagnosis not present

## 2020-03-08 DIAGNOSIS — D509 Iron deficiency anemia, unspecified: Secondary | ICD-10-CM | POA: Diagnosis not present

## 2020-03-08 DIAGNOSIS — N250 Renal osteodystrophy: Secondary | ICD-10-CM | POA: Diagnosis not present

## 2020-03-08 DIAGNOSIS — R110 Nausea: Secondary | ICD-10-CM | POA: Diagnosis not present

## 2020-03-11 DIAGNOSIS — N186 End stage renal disease: Secondary | ICD-10-CM | POA: Diagnosis not present

## 2020-03-11 DIAGNOSIS — D631 Anemia in chronic kidney disease: Secondary | ICD-10-CM | POA: Diagnosis not present

## 2020-03-11 DIAGNOSIS — R110 Nausea: Secondary | ICD-10-CM | POA: Diagnosis not present

## 2020-03-11 DIAGNOSIS — Z992 Dependence on renal dialysis: Secondary | ICD-10-CM | POA: Diagnosis not present

## 2020-03-11 DIAGNOSIS — D509 Iron deficiency anemia, unspecified: Secondary | ICD-10-CM | POA: Diagnosis not present

## 2020-03-11 DIAGNOSIS — N250 Renal osteodystrophy: Secondary | ICD-10-CM | POA: Diagnosis not present

## 2020-03-13 DIAGNOSIS — Z992 Dependence on renal dialysis: Secondary | ICD-10-CM | POA: Diagnosis not present

## 2020-03-13 DIAGNOSIS — R110 Nausea: Secondary | ICD-10-CM | POA: Diagnosis not present

## 2020-03-13 DIAGNOSIS — D509 Iron deficiency anemia, unspecified: Secondary | ICD-10-CM | POA: Diagnosis not present

## 2020-03-13 DIAGNOSIS — D631 Anemia in chronic kidney disease: Secondary | ICD-10-CM | POA: Diagnosis not present

## 2020-03-13 DIAGNOSIS — N250 Renal osteodystrophy: Secondary | ICD-10-CM | POA: Diagnosis not present

## 2020-03-13 DIAGNOSIS — N186 End stage renal disease: Secondary | ICD-10-CM | POA: Diagnosis not present

## 2020-03-18 DIAGNOSIS — D631 Anemia in chronic kidney disease: Secondary | ICD-10-CM | POA: Diagnosis not present

## 2020-03-18 DIAGNOSIS — R110 Nausea: Secondary | ICD-10-CM | POA: Diagnosis not present

## 2020-03-18 DIAGNOSIS — N250 Renal osteodystrophy: Secondary | ICD-10-CM | POA: Diagnosis not present

## 2020-03-18 DIAGNOSIS — Z992 Dependence on renal dialysis: Secondary | ICD-10-CM | POA: Diagnosis not present

## 2020-03-18 DIAGNOSIS — N186 End stage renal disease: Secondary | ICD-10-CM | POA: Diagnosis not present

## 2020-03-18 DIAGNOSIS — D509 Iron deficiency anemia, unspecified: Secondary | ICD-10-CM | POA: Diagnosis not present

## 2020-03-20 DIAGNOSIS — N186 End stage renal disease: Secondary | ICD-10-CM | POA: Diagnosis not present

## 2020-03-20 DIAGNOSIS — D509 Iron deficiency anemia, unspecified: Secondary | ICD-10-CM | POA: Diagnosis not present

## 2020-03-20 DIAGNOSIS — Z992 Dependence on renal dialysis: Secondary | ICD-10-CM | POA: Diagnosis not present

## 2020-03-20 DIAGNOSIS — N250 Renal osteodystrophy: Secondary | ICD-10-CM | POA: Diagnosis not present

## 2020-03-20 DIAGNOSIS — D631 Anemia in chronic kidney disease: Secondary | ICD-10-CM | POA: Diagnosis not present

## 2020-03-20 DIAGNOSIS — R110 Nausea: Secondary | ICD-10-CM | POA: Diagnosis not present

## 2020-03-22 DIAGNOSIS — Z992 Dependence on renal dialysis: Secondary | ICD-10-CM | POA: Diagnosis not present

## 2020-03-22 DIAGNOSIS — D509 Iron deficiency anemia, unspecified: Secondary | ICD-10-CM | POA: Diagnosis not present

## 2020-03-22 DIAGNOSIS — R110 Nausea: Secondary | ICD-10-CM | POA: Diagnosis not present

## 2020-03-22 DIAGNOSIS — N186 End stage renal disease: Secondary | ICD-10-CM | POA: Diagnosis not present

## 2020-03-22 DIAGNOSIS — N250 Renal osteodystrophy: Secondary | ICD-10-CM | POA: Diagnosis not present

## 2020-03-22 DIAGNOSIS — D631 Anemia in chronic kidney disease: Secondary | ICD-10-CM | POA: Diagnosis not present

## 2020-03-26 DIAGNOSIS — N186 End stage renal disease: Secondary | ICD-10-CM | POA: Diagnosis not present

## 2020-03-26 DIAGNOSIS — D509 Iron deficiency anemia, unspecified: Secondary | ICD-10-CM | POA: Diagnosis not present

## 2020-03-26 DIAGNOSIS — Z992 Dependence on renal dialysis: Secondary | ICD-10-CM | POA: Diagnosis not present

## 2020-03-26 DIAGNOSIS — N250 Renal osteodystrophy: Secondary | ICD-10-CM | POA: Diagnosis not present

## 2020-03-26 DIAGNOSIS — D631 Anemia in chronic kidney disease: Secondary | ICD-10-CM | POA: Diagnosis not present

## 2020-03-26 DIAGNOSIS — R110 Nausea: Secondary | ICD-10-CM | POA: Diagnosis not present

## 2020-03-27 DIAGNOSIS — R110 Nausea: Secondary | ICD-10-CM | POA: Diagnosis not present

## 2020-03-27 DIAGNOSIS — D509 Iron deficiency anemia, unspecified: Secondary | ICD-10-CM | POA: Diagnosis not present

## 2020-03-27 DIAGNOSIS — N250 Renal osteodystrophy: Secondary | ICD-10-CM | POA: Diagnosis not present

## 2020-03-27 DIAGNOSIS — D631 Anemia in chronic kidney disease: Secondary | ICD-10-CM | POA: Diagnosis not present

## 2020-03-27 DIAGNOSIS — Z992 Dependence on renal dialysis: Secondary | ICD-10-CM | POA: Diagnosis not present

## 2020-03-27 DIAGNOSIS — N186 End stage renal disease: Secondary | ICD-10-CM | POA: Diagnosis not present

## 2020-03-29 DIAGNOSIS — D631 Anemia in chronic kidney disease: Secondary | ICD-10-CM | POA: Diagnosis not present

## 2020-03-29 DIAGNOSIS — Z992 Dependence on renal dialysis: Secondary | ICD-10-CM | POA: Diagnosis not present

## 2020-03-29 DIAGNOSIS — D509 Iron deficiency anemia, unspecified: Secondary | ICD-10-CM | POA: Diagnosis not present

## 2020-03-29 DIAGNOSIS — R110 Nausea: Secondary | ICD-10-CM | POA: Diagnosis not present

## 2020-03-29 DIAGNOSIS — N186 End stage renal disease: Secondary | ICD-10-CM | POA: Diagnosis not present

## 2020-03-29 DIAGNOSIS — N250 Renal osteodystrophy: Secondary | ICD-10-CM | POA: Diagnosis not present

## 2020-03-31 DIAGNOSIS — N185 Chronic kidney disease, stage 5: Secondary | ICD-10-CM | POA: Diagnosis not present

## 2020-03-31 DIAGNOSIS — I25811 Atherosclerosis of native coronary artery of transplanted heart without angina pectoris: Secondary | ICD-10-CM | POA: Diagnosis not present

## 2020-03-31 DIAGNOSIS — I10 Essential (primary) hypertension: Secondary | ICD-10-CM | POA: Diagnosis not present

## 2020-03-31 DIAGNOSIS — R197 Diarrhea, unspecified: Secondary | ICD-10-CM | POA: Diagnosis not present

## 2020-03-31 DIAGNOSIS — Z299 Encounter for prophylactic measures, unspecified: Secondary | ICD-10-CM | POA: Diagnosis not present

## 2020-04-01 DIAGNOSIS — R110 Nausea: Secondary | ICD-10-CM | POA: Diagnosis not present

## 2020-04-01 DIAGNOSIS — N250 Renal osteodystrophy: Secondary | ICD-10-CM | POA: Diagnosis not present

## 2020-04-01 DIAGNOSIS — Z992 Dependence on renal dialysis: Secondary | ICD-10-CM | POA: Diagnosis not present

## 2020-04-01 DIAGNOSIS — N186 End stage renal disease: Secondary | ICD-10-CM | POA: Diagnosis not present

## 2020-04-01 DIAGNOSIS — D509 Iron deficiency anemia, unspecified: Secondary | ICD-10-CM | POA: Diagnosis not present

## 2020-04-01 DIAGNOSIS — D631 Anemia in chronic kidney disease: Secondary | ICD-10-CM | POA: Diagnosis not present

## 2020-04-05 DIAGNOSIS — Z992 Dependence on renal dialysis: Secondary | ICD-10-CM | POA: Diagnosis not present

## 2020-04-05 DIAGNOSIS — N250 Renal osteodystrophy: Secondary | ICD-10-CM | POA: Diagnosis not present

## 2020-04-05 DIAGNOSIS — R110 Nausea: Secondary | ICD-10-CM | POA: Diagnosis not present

## 2020-04-05 DIAGNOSIS — D631 Anemia in chronic kidney disease: Secondary | ICD-10-CM | POA: Diagnosis not present

## 2020-04-05 DIAGNOSIS — N186 End stage renal disease: Secondary | ICD-10-CM | POA: Diagnosis not present

## 2020-04-05 DIAGNOSIS — D509 Iron deficiency anemia, unspecified: Secondary | ICD-10-CM | POA: Diagnosis not present

## 2020-04-06 DIAGNOSIS — E7800 Pure hypercholesterolemia, unspecified: Secondary | ICD-10-CM | POA: Diagnosis not present

## 2020-04-06 DIAGNOSIS — I10 Essential (primary) hypertension: Secondary | ICD-10-CM | POA: Diagnosis not present

## 2020-04-07 DIAGNOSIS — N186 End stage renal disease: Secondary | ICD-10-CM | POA: Diagnosis not present

## 2020-04-07 DIAGNOSIS — Z992 Dependence on renal dialysis: Secondary | ICD-10-CM | POA: Diagnosis not present

## 2020-04-08 DIAGNOSIS — D509 Iron deficiency anemia, unspecified: Secondary | ICD-10-CM | POA: Diagnosis not present

## 2020-04-08 DIAGNOSIS — N250 Renal osteodystrophy: Secondary | ICD-10-CM | POA: Diagnosis not present

## 2020-04-08 DIAGNOSIS — N186 End stage renal disease: Secondary | ICD-10-CM | POA: Diagnosis not present

## 2020-04-08 DIAGNOSIS — D631 Anemia in chronic kidney disease: Secondary | ICD-10-CM | POA: Diagnosis not present

## 2020-04-08 DIAGNOSIS — Z992 Dependence on renal dialysis: Secondary | ICD-10-CM | POA: Diagnosis not present

## 2020-04-09 DIAGNOSIS — K529 Noninfective gastroenteritis and colitis, unspecified: Secondary | ICD-10-CM | POA: Diagnosis not present

## 2020-04-09 DIAGNOSIS — R102 Pelvic and perineal pain: Secondary | ICD-10-CM | POA: Diagnosis not present

## 2020-04-09 DIAGNOSIS — R197 Diarrhea, unspecified: Secondary | ICD-10-CM | POA: Diagnosis not present

## 2020-04-10 DIAGNOSIS — N250 Renal osteodystrophy: Secondary | ICD-10-CM | POA: Diagnosis not present

## 2020-04-10 DIAGNOSIS — D509 Iron deficiency anemia, unspecified: Secondary | ICD-10-CM | POA: Diagnosis not present

## 2020-04-10 DIAGNOSIS — Z992 Dependence on renal dialysis: Secondary | ICD-10-CM | POA: Diagnosis not present

## 2020-04-10 DIAGNOSIS — D631 Anemia in chronic kidney disease: Secondary | ICD-10-CM | POA: Diagnosis not present

## 2020-04-10 DIAGNOSIS — N186 End stage renal disease: Secondary | ICD-10-CM | POA: Diagnosis not present

## 2020-04-11 DIAGNOSIS — R159 Full incontinence of feces: Secondary | ICD-10-CM | POA: Diagnosis not present

## 2020-04-11 DIAGNOSIS — R140 Abdominal distension (gaseous): Secondary | ICD-10-CM | POA: Diagnosis not present

## 2020-04-11 DIAGNOSIS — R150 Incomplete defecation: Secondary | ICD-10-CM | POA: Diagnosis not present

## 2020-04-11 DIAGNOSIS — D849 Immunodeficiency, unspecified: Secondary | ICD-10-CM | POA: Diagnosis not present

## 2020-04-11 DIAGNOSIS — K6289 Other specified diseases of anus and rectum: Secondary | ICD-10-CM | POA: Diagnosis not present

## 2020-04-11 DIAGNOSIS — R1030 Lower abdominal pain, unspecified: Secondary | ICD-10-CM | POA: Diagnosis not present

## 2020-04-11 DIAGNOSIS — R197 Diarrhea, unspecified: Secondary | ICD-10-CM | POA: Diagnosis not present

## 2020-04-11 DIAGNOSIS — Z9049 Acquired absence of other specified parts of digestive tract: Secondary | ICD-10-CM | POA: Diagnosis not present

## 2020-04-11 DIAGNOSIS — R252 Cramp and spasm: Secondary | ICD-10-CM | POA: Diagnosis not present

## 2020-04-12 DIAGNOSIS — D631 Anemia in chronic kidney disease: Secondary | ICD-10-CM | POA: Diagnosis not present

## 2020-04-12 DIAGNOSIS — N186 End stage renal disease: Secondary | ICD-10-CM | POA: Diagnosis not present

## 2020-04-12 DIAGNOSIS — D509 Iron deficiency anemia, unspecified: Secondary | ICD-10-CM | POA: Diagnosis not present

## 2020-04-12 DIAGNOSIS — N250 Renal osteodystrophy: Secondary | ICD-10-CM | POA: Diagnosis not present

## 2020-04-12 DIAGNOSIS — Z992 Dependence on renal dialysis: Secondary | ICD-10-CM | POA: Diagnosis not present

## 2020-04-15 DIAGNOSIS — I442 Atrioventricular block, complete: Secondary | ICD-10-CM | POA: Diagnosis not present

## 2020-04-15 DIAGNOSIS — Z950 Presence of cardiac pacemaker: Secondary | ICD-10-CM | POA: Diagnosis not present

## 2020-04-17 DIAGNOSIS — N250 Renal osteodystrophy: Secondary | ICD-10-CM | POA: Diagnosis not present

## 2020-04-17 DIAGNOSIS — Z992 Dependence on renal dialysis: Secondary | ICD-10-CM | POA: Diagnosis not present

## 2020-04-17 DIAGNOSIS — D509 Iron deficiency anemia, unspecified: Secondary | ICD-10-CM | POA: Diagnosis not present

## 2020-04-17 DIAGNOSIS — D631 Anemia in chronic kidney disease: Secondary | ICD-10-CM | POA: Diagnosis not present

## 2020-04-17 DIAGNOSIS — N186 End stage renal disease: Secondary | ICD-10-CM | POA: Diagnosis not present

## 2020-04-19 DIAGNOSIS — Z992 Dependence on renal dialysis: Secondary | ICD-10-CM | POA: Diagnosis not present

## 2020-04-19 DIAGNOSIS — N250 Renal osteodystrophy: Secondary | ICD-10-CM | POA: Diagnosis not present

## 2020-04-19 DIAGNOSIS — N186 End stage renal disease: Secondary | ICD-10-CM | POA: Diagnosis not present

## 2020-04-19 DIAGNOSIS — D631 Anemia in chronic kidney disease: Secondary | ICD-10-CM | POA: Diagnosis not present

## 2020-04-19 DIAGNOSIS — D509 Iron deficiency anemia, unspecified: Secondary | ICD-10-CM | POA: Diagnosis not present

## 2020-04-22 DIAGNOSIS — D631 Anemia in chronic kidney disease: Secondary | ICD-10-CM | POA: Diagnosis not present

## 2020-04-22 DIAGNOSIS — D509 Iron deficiency anemia, unspecified: Secondary | ICD-10-CM | POA: Diagnosis not present

## 2020-04-22 DIAGNOSIS — Z992 Dependence on renal dialysis: Secondary | ICD-10-CM | POA: Diagnosis not present

## 2020-04-22 DIAGNOSIS — N186 End stage renal disease: Secondary | ICD-10-CM | POA: Diagnosis not present

## 2020-04-22 DIAGNOSIS — N250 Renal osteodystrophy: Secondary | ICD-10-CM | POA: Diagnosis not present

## 2020-04-24 DIAGNOSIS — N186 End stage renal disease: Secondary | ICD-10-CM | POA: Diagnosis not present

## 2020-04-24 DIAGNOSIS — D509 Iron deficiency anemia, unspecified: Secondary | ICD-10-CM | POA: Diagnosis not present

## 2020-04-24 DIAGNOSIS — Z992 Dependence on renal dialysis: Secondary | ICD-10-CM | POA: Diagnosis not present

## 2020-04-24 DIAGNOSIS — N250 Renal osteodystrophy: Secondary | ICD-10-CM | POA: Diagnosis not present

## 2020-04-24 DIAGNOSIS — D631 Anemia in chronic kidney disease: Secondary | ICD-10-CM | POA: Diagnosis not present

## 2020-04-26 DIAGNOSIS — D509 Iron deficiency anemia, unspecified: Secondary | ICD-10-CM | POA: Diagnosis not present

## 2020-04-26 DIAGNOSIS — N186 End stage renal disease: Secondary | ICD-10-CM | POA: Diagnosis not present

## 2020-04-26 DIAGNOSIS — N250 Renal osteodystrophy: Secondary | ICD-10-CM | POA: Diagnosis not present

## 2020-04-26 DIAGNOSIS — D631 Anemia in chronic kidney disease: Secondary | ICD-10-CM | POA: Diagnosis not present

## 2020-04-26 DIAGNOSIS — Z992 Dependence on renal dialysis: Secondary | ICD-10-CM | POA: Diagnosis not present

## 2020-05-01 DIAGNOSIS — N250 Renal osteodystrophy: Secondary | ICD-10-CM | POA: Diagnosis not present

## 2020-05-01 DIAGNOSIS — D509 Iron deficiency anemia, unspecified: Secondary | ICD-10-CM | POA: Diagnosis not present

## 2020-05-01 DIAGNOSIS — D631 Anemia in chronic kidney disease: Secondary | ICD-10-CM | POA: Diagnosis not present

## 2020-05-01 DIAGNOSIS — Z992 Dependence on renal dialysis: Secondary | ICD-10-CM | POA: Diagnosis not present

## 2020-05-01 DIAGNOSIS — N186 End stage renal disease: Secondary | ICD-10-CM | POA: Diagnosis not present

## 2020-05-03 DIAGNOSIS — N250 Renal osteodystrophy: Secondary | ICD-10-CM | POA: Diagnosis not present

## 2020-05-03 DIAGNOSIS — N186 End stage renal disease: Secondary | ICD-10-CM | POA: Diagnosis not present

## 2020-05-03 DIAGNOSIS — D631 Anemia in chronic kidney disease: Secondary | ICD-10-CM | POA: Diagnosis not present

## 2020-05-03 DIAGNOSIS — Z992 Dependence on renal dialysis: Secondary | ICD-10-CM | POA: Diagnosis not present

## 2020-05-03 DIAGNOSIS — D509 Iron deficiency anemia, unspecified: Secondary | ICD-10-CM | POA: Diagnosis not present

## 2020-05-07 DIAGNOSIS — I10 Essential (primary) hypertension: Secondary | ICD-10-CM | POA: Diagnosis not present

## 2020-05-07 DIAGNOSIS — E7800 Pure hypercholesterolemia, unspecified: Secondary | ICD-10-CM | POA: Diagnosis not present

## 2020-05-08 DIAGNOSIS — D509 Iron deficiency anemia, unspecified: Secondary | ICD-10-CM | POA: Diagnosis not present

## 2020-05-08 DIAGNOSIS — N250 Renal osteodystrophy: Secondary | ICD-10-CM | POA: Diagnosis not present

## 2020-05-08 DIAGNOSIS — D631 Anemia in chronic kidney disease: Secondary | ICD-10-CM | POA: Diagnosis not present

## 2020-05-08 DIAGNOSIS — Z992 Dependence on renal dialysis: Secondary | ICD-10-CM | POA: Diagnosis not present

## 2020-05-08 DIAGNOSIS — R110 Nausea: Secondary | ICD-10-CM | POA: Diagnosis not present

## 2020-05-08 DIAGNOSIS — N186 End stage renal disease: Secondary | ICD-10-CM | POA: Diagnosis not present

## 2020-05-10 DIAGNOSIS — D509 Iron deficiency anemia, unspecified: Secondary | ICD-10-CM | POA: Diagnosis not present

## 2020-05-10 DIAGNOSIS — D631 Anemia in chronic kidney disease: Secondary | ICD-10-CM | POA: Diagnosis not present

## 2020-05-10 DIAGNOSIS — N186 End stage renal disease: Secondary | ICD-10-CM | POA: Diagnosis not present

## 2020-05-10 DIAGNOSIS — R110 Nausea: Secondary | ICD-10-CM | POA: Diagnosis not present

## 2020-05-10 DIAGNOSIS — Z992 Dependence on renal dialysis: Secondary | ICD-10-CM | POA: Diagnosis not present

## 2020-05-10 DIAGNOSIS — N250 Renal osteodystrophy: Secondary | ICD-10-CM | POA: Diagnosis not present

## 2020-05-14 DIAGNOSIS — N186 End stage renal disease: Secondary | ICD-10-CM | POA: Diagnosis not present

## 2020-05-14 DIAGNOSIS — R110 Nausea: Secondary | ICD-10-CM | POA: Diagnosis not present

## 2020-05-14 DIAGNOSIS — D509 Iron deficiency anemia, unspecified: Secondary | ICD-10-CM | POA: Diagnosis not present

## 2020-05-14 DIAGNOSIS — N250 Renal osteodystrophy: Secondary | ICD-10-CM | POA: Diagnosis not present

## 2020-05-14 DIAGNOSIS — Z992 Dependence on renal dialysis: Secondary | ICD-10-CM | POA: Diagnosis not present

## 2020-05-14 DIAGNOSIS — D631 Anemia in chronic kidney disease: Secondary | ICD-10-CM | POA: Diagnosis not present

## 2020-05-15 DIAGNOSIS — D631 Anemia in chronic kidney disease: Secondary | ICD-10-CM | POA: Diagnosis not present

## 2020-05-15 DIAGNOSIS — Z992 Dependence on renal dialysis: Secondary | ICD-10-CM | POA: Diagnosis not present

## 2020-05-15 DIAGNOSIS — N186 End stage renal disease: Secondary | ICD-10-CM | POA: Diagnosis not present

## 2020-05-15 DIAGNOSIS — N250 Renal osteodystrophy: Secondary | ICD-10-CM | POA: Diagnosis not present

## 2020-05-15 DIAGNOSIS — D509 Iron deficiency anemia, unspecified: Secondary | ICD-10-CM | POA: Diagnosis not present

## 2020-05-15 DIAGNOSIS — R110 Nausea: Secondary | ICD-10-CM | POA: Diagnosis not present

## 2020-05-17 DIAGNOSIS — Z992 Dependence on renal dialysis: Secondary | ICD-10-CM | POA: Diagnosis not present

## 2020-05-17 DIAGNOSIS — D509 Iron deficiency anemia, unspecified: Secondary | ICD-10-CM | POA: Diagnosis not present

## 2020-05-17 DIAGNOSIS — D631 Anemia in chronic kidney disease: Secondary | ICD-10-CM | POA: Diagnosis not present

## 2020-05-17 DIAGNOSIS — N250 Renal osteodystrophy: Secondary | ICD-10-CM | POA: Diagnosis not present

## 2020-05-17 DIAGNOSIS — R110 Nausea: Secondary | ICD-10-CM | POA: Diagnosis not present

## 2020-05-17 DIAGNOSIS — N186 End stage renal disease: Secondary | ICD-10-CM | POA: Diagnosis not present

## 2020-05-20 DIAGNOSIS — Z992 Dependence on renal dialysis: Secondary | ICD-10-CM | POA: Diagnosis not present

## 2020-05-20 DIAGNOSIS — N186 End stage renal disease: Secondary | ICD-10-CM | POA: Diagnosis not present

## 2020-05-20 DIAGNOSIS — N250 Renal osteodystrophy: Secondary | ICD-10-CM | POA: Diagnosis not present

## 2020-05-20 DIAGNOSIS — D509 Iron deficiency anemia, unspecified: Secondary | ICD-10-CM | POA: Diagnosis not present

## 2020-05-20 DIAGNOSIS — D631 Anemia in chronic kidney disease: Secondary | ICD-10-CM | POA: Diagnosis not present

## 2020-05-20 DIAGNOSIS — R110 Nausea: Secondary | ICD-10-CM | POA: Diagnosis not present

## 2020-05-22 DIAGNOSIS — N186 End stage renal disease: Secondary | ICD-10-CM | POA: Diagnosis not present

## 2020-05-22 DIAGNOSIS — R110 Nausea: Secondary | ICD-10-CM | POA: Diagnosis not present

## 2020-05-22 DIAGNOSIS — Z992 Dependence on renal dialysis: Secondary | ICD-10-CM | POA: Diagnosis not present

## 2020-05-22 DIAGNOSIS — D631 Anemia in chronic kidney disease: Secondary | ICD-10-CM | POA: Diagnosis not present

## 2020-05-22 DIAGNOSIS — D509 Iron deficiency anemia, unspecified: Secondary | ICD-10-CM | POA: Diagnosis not present

## 2020-05-22 DIAGNOSIS — N250 Renal osteodystrophy: Secondary | ICD-10-CM | POA: Diagnosis not present

## 2020-05-24 DIAGNOSIS — Z992 Dependence on renal dialysis: Secondary | ICD-10-CM | POA: Diagnosis not present

## 2020-05-24 DIAGNOSIS — N186 End stage renal disease: Secondary | ICD-10-CM | POA: Diagnosis not present

## 2020-05-24 DIAGNOSIS — D509 Iron deficiency anemia, unspecified: Secondary | ICD-10-CM | POA: Diagnosis not present

## 2020-05-24 DIAGNOSIS — R110 Nausea: Secondary | ICD-10-CM | POA: Diagnosis not present

## 2020-05-24 DIAGNOSIS — D631 Anemia in chronic kidney disease: Secondary | ICD-10-CM | POA: Diagnosis not present

## 2020-05-24 DIAGNOSIS — N250 Renal osteodystrophy: Secondary | ICD-10-CM | POA: Diagnosis not present

## 2020-05-26 DIAGNOSIS — Z992 Dependence on renal dialysis: Secondary | ICD-10-CM | POA: Diagnosis not present

## 2020-05-26 DIAGNOSIS — N186 End stage renal disease: Secondary | ICD-10-CM | POA: Diagnosis not present

## 2020-05-26 DIAGNOSIS — D509 Iron deficiency anemia, unspecified: Secondary | ICD-10-CM | POA: Diagnosis not present

## 2020-05-26 DIAGNOSIS — D631 Anemia in chronic kidney disease: Secondary | ICD-10-CM | POA: Diagnosis not present

## 2020-05-26 DIAGNOSIS — R110 Nausea: Secondary | ICD-10-CM | POA: Diagnosis not present

## 2020-05-26 DIAGNOSIS — N250 Renal osteodystrophy: Secondary | ICD-10-CM | POA: Diagnosis not present

## 2020-05-27 DIAGNOSIS — D013 Carcinoma in situ of anus and anal canal: Secondary | ICD-10-CM | POA: Diagnosis not present

## 2020-05-27 DIAGNOSIS — Z01818 Encounter for other preprocedural examination: Secondary | ICD-10-CM | POA: Diagnosis not present

## 2020-05-27 DIAGNOSIS — I10 Essential (primary) hypertension: Secondary | ICD-10-CM | POA: Diagnosis not present

## 2020-05-27 DIAGNOSIS — Z941 Heart transplant status: Secondary | ICD-10-CM | POA: Diagnosis not present

## 2020-05-27 DIAGNOSIS — N186 End stage renal disease: Secondary | ICD-10-CM | POA: Diagnosis not present

## 2020-05-27 DIAGNOSIS — Z992 Dependence on renal dialysis: Secondary | ICD-10-CM | POA: Diagnosis not present

## 2020-05-27 DIAGNOSIS — Z79899 Other long term (current) drug therapy: Secondary | ICD-10-CM | POA: Diagnosis not present

## 2020-05-29 DIAGNOSIS — N186 End stage renal disease: Secondary | ICD-10-CM | POA: Diagnosis not present

## 2020-05-29 DIAGNOSIS — N250 Renal osteodystrophy: Secondary | ICD-10-CM | POA: Diagnosis not present

## 2020-05-29 DIAGNOSIS — D631 Anemia in chronic kidney disease: Secondary | ICD-10-CM | POA: Diagnosis not present

## 2020-05-29 DIAGNOSIS — Z992 Dependence on renal dialysis: Secondary | ICD-10-CM | POA: Diagnosis not present

## 2020-05-29 DIAGNOSIS — D509 Iron deficiency anemia, unspecified: Secondary | ICD-10-CM | POA: Diagnosis not present

## 2020-05-29 DIAGNOSIS — R110 Nausea: Secondary | ICD-10-CM | POA: Diagnosis not present

## 2020-06-02 DIAGNOSIS — D509 Iron deficiency anemia, unspecified: Secondary | ICD-10-CM | POA: Diagnosis not present

## 2020-06-02 DIAGNOSIS — R110 Nausea: Secondary | ICD-10-CM | POA: Diagnosis not present

## 2020-06-02 DIAGNOSIS — D631 Anemia in chronic kidney disease: Secondary | ICD-10-CM | POA: Diagnosis not present

## 2020-06-02 DIAGNOSIS — Z992 Dependence on renal dialysis: Secondary | ICD-10-CM | POA: Diagnosis not present

## 2020-06-02 DIAGNOSIS — N250 Renal osteodystrophy: Secondary | ICD-10-CM | POA: Diagnosis not present

## 2020-06-02 DIAGNOSIS — N186 End stage renal disease: Secondary | ICD-10-CM | POA: Diagnosis not present

## 2020-06-03 DIAGNOSIS — Z941 Heart transplant status: Secondary | ICD-10-CM | POA: Diagnosis not present

## 2020-06-03 DIAGNOSIS — R197 Diarrhea, unspecified: Secondary | ICD-10-CM | POA: Diagnosis not present

## 2020-06-03 DIAGNOSIS — K629 Disease of anus and rectum, unspecified: Secondary | ICD-10-CM | POA: Diagnosis not present

## 2020-06-03 DIAGNOSIS — R1030 Lower abdominal pain, unspecified: Secondary | ICD-10-CM | POA: Diagnosis not present

## 2020-06-05 DIAGNOSIS — R110 Nausea: Secondary | ICD-10-CM | POA: Diagnosis not present

## 2020-06-05 DIAGNOSIS — N250 Renal osteodystrophy: Secondary | ICD-10-CM | POA: Diagnosis not present

## 2020-06-05 DIAGNOSIS — D509 Iron deficiency anemia, unspecified: Secondary | ICD-10-CM | POA: Diagnosis not present

## 2020-06-05 DIAGNOSIS — Z992 Dependence on renal dialysis: Secondary | ICD-10-CM | POA: Diagnosis not present

## 2020-06-05 DIAGNOSIS — N186 End stage renal disease: Secondary | ICD-10-CM | POA: Diagnosis not present

## 2020-06-05 DIAGNOSIS — D631 Anemia in chronic kidney disease: Secondary | ICD-10-CM | POA: Diagnosis not present

## 2020-06-06 DIAGNOSIS — I10 Essential (primary) hypertension: Secondary | ICD-10-CM | POA: Diagnosis not present

## 2020-06-06 DIAGNOSIS — E7800 Pure hypercholesterolemia, unspecified: Secondary | ICD-10-CM | POA: Diagnosis not present

## 2020-06-07 DIAGNOSIS — N186 End stage renal disease: Secondary | ICD-10-CM | POA: Diagnosis not present

## 2020-06-07 DIAGNOSIS — D631 Anemia in chronic kidney disease: Secondary | ICD-10-CM | POA: Diagnosis not present

## 2020-06-07 DIAGNOSIS — Z992 Dependence on renal dialysis: Secondary | ICD-10-CM | POA: Diagnosis not present

## 2020-06-07 DIAGNOSIS — D509 Iron deficiency anemia, unspecified: Secondary | ICD-10-CM | POA: Diagnosis not present

## 2020-06-07 DIAGNOSIS — R110 Nausea: Secondary | ICD-10-CM | POA: Diagnosis not present

## 2020-06-07 DIAGNOSIS — N250 Renal osteodystrophy: Secondary | ICD-10-CM | POA: Diagnosis not present

## 2020-06-08 DIAGNOSIS — N186 End stage renal disease: Secondary | ICD-10-CM | POA: Diagnosis not present

## 2020-06-08 DIAGNOSIS — D631 Anemia in chronic kidney disease: Secondary | ICD-10-CM | POA: Diagnosis not present

## 2020-06-08 DIAGNOSIS — N250 Renal osteodystrophy: Secondary | ICD-10-CM | POA: Diagnosis not present

## 2020-06-08 DIAGNOSIS — Z992 Dependence on renal dialysis: Secondary | ICD-10-CM | POA: Diagnosis not present

## 2020-06-08 DIAGNOSIS — D509 Iron deficiency anemia, unspecified: Secondary | ICD-10-CM | POA: Diagnosis not present

## 2020-06-08 DIAGNOSIS — Z23 Encounter for immunization: Secondary | ICD-10-CM | POA: Diagnosis not present

## 2020-06-10 DIAGNOSIS — I10 Essential (primary) hypertension: Secondary | ICD-10-CM | POA: Diagnosis not present

## 2020-06-10 DIAGNOSIS — E7800 Pure hypercholesterolemia, unspecified: Secondary | ICD-10-CM | POA: Diagnosis not present

## 2020-06-10 DIAGNOSIS — N250 Renal osteodystrophy: Secondary | ICD-10-CM | POA: Diagnosis not present

## 2020-06-10 DIAGNOSIS — Z992 Dependence on renal dialysis: Secondary | ICD-10-CM | POA: Diagnosis not present

## 2020-06-10 DIAGNOSIS — D509 Iron deficiency anemia, unspecified: Secondary | ICD-10-CM | POA: Diagnosis not present

## 2020-06-10 DIAGNOSIS — Z23 Encounter for immunization: Secondary | ICD-10-CM | POA: Diagnosis not present

## 2020-06-10 DIAGNOSIS — N186 End stage renal disease: Secondary | ICD-10-CM | POA: Diagnosis not present

## 2020-06-10 DIAGNOSIS — D631 Anemia in chronic kidney disease: Secondary | ICD-10-CM | POA: Diagnosis not present

## 2020-06-12 DIAGNOSIS — N250 Renal osteodystrophy: Secondary | ICD-10-CM | POA: Diagnosis not present

## 2020-06-12 DIAGNOSIS — D631 Anemia in chronic kidney disease: Secondary | ICD-10-CM | POA: Diagnosis not present

## 2020-06-12 DIAGNOSIS — D509 Iron deficiency anemia, unspecified: Secondary | ICD-10-CM | POA: Diagnosis not present

## 2020-06-12 DIAGNOSIS — Z992 Dependence on renal dialysis: Secondary | ICD-10-CM | POA: Diagnosis not present

## 2020-06-12 DIAGNOSIS — N186 End stage renal disease: Secondary | ICD-10-CM | POA: Diagnosis not present

## 2020-06-12 DIAGNOSIS — Z23 Encounter for immunization: Secondary | ICD-10-CM | POA: Diagnosis not present

## 2020-06-14 DIAGNOSIS — Z23 Encounter for immunization: Secondary | ICD-10-CM | POA: Diagnosis not present

## 2020-06-14 DIAGNOSIS — N250 Renal osteodystrophy: Secondary | ICD-10-CM | POA: Diagnosis not present

## 2020-06-14 DIAGNOSIS — N186 End stage renal disease: Secondary | ICD-10-CM | POA: Diagnosis not present

## 2020-06-14 DIAGNOSIS — D631 Anemia in chronic kidney disease: Secondary | ICD-10-CM | POA: Diagnosis not present

## 2020-06-14 DIAGNOSIS — Z992 Dependence on renal dialysis: Secondary | ICD-10-CM | POA: Diagnosis not present

## 2020-06-14 DIAGNOSIS — D509 Iron deficiency anemia, unspecified: Secondary | ICD-10-CM | POA: Diagnosis not present

## 2020-06-17 DIAGNOSIS — N186 End stage renal disease: Secondary | ICD-10-CM | POA: Diagnosis not present

## 2020-06-17 DIAGNOSIS — Z23 Encounter for immunization: Secondary | ICD-10-CM | POA: Diagnosis not present

## 2020-06-17 DIAGNOSIS — D509 Iron deficiency anemia, unspecified: Secondary | ICD-10-CM | POA: Diagnosis not present

## 2020-06-17 DIAGNOSIS — N250 Renal osteodystrophy: Secondary | ICD-10-CM | POA: Diagnosis not present

## 2020-06-17 DIAGNOSIS — Z992 Dependence on renal dialysis: Secondary | ICD-10-CM | POA: Diagnosis not present

## 2020-06-17 DIAGNOSIS — D631 Anemia in chronic kidney disease: Secondary | ICD-10-CM | POA: Diagnosis not present

## 2020-06-19 DIAGNOSIS — E785 Hyperlipidemia, unspecified: Secondary | ICD-10-CM | POA: Diagnosis not present

## 2020-06-19 DIAGNOSIS — I442 Atrioventricular block, complete: Secondary | ICD-10-CM | POA: Diagnosis not present

## 2020-06-19 DIAGNOSIS — D84821 Immunodeficiency due to drugs: Secondary | ICD-10-CM | POA: Diagnosis not present

## 2020-06-19 DIAGNOSIS — Z992 Dependence on renal dialysis: Secondary | ICD-10-CM | POA: Diagnosis not present

## 2020-06-19 DIAGNOSIS — N186 End stage renal disease: Secondary | ICD-10-CM | POA: Diagnosis not present

## 2020-06-19 DIAGNOSIS — F17210 Nicotine dependence, cigarettes, uncomplicated: Secondary | ICD-10-CM | POA: Diagnosis not present

## 2020-06-19 DIAGNOSIS — N250 Renal osteodystrophy: Secondary | ICD-10-CM | POA: Diagnosis not present

## 2020-06-19 DIAGNOSIS — D631 Anemia in chronic kidney disease: Secondary | ICD-10-CM | POA: Diagnosis not present

## 2020-06-19 DIAGNOSIS — D013 Carcinoma in situ of anus and anal canal: Secondary | ICD-10-CM | POA: Diagnosis not present

## 2020-06-19 DIAGNOSIS — Z950 Presence of cardiac pacemaker: Secondary | ICD-10-CM | POA: Diagnosis not present

## 2020-06-19 DIAGNOSIS — Z23 Encounter for immunization: Secondary | ICD-10-CM | POA: Diagnosis not present

## 2020-06-19 DIAGNOSIS — D509 Iron deficiency anemia, unspecified: Secondary | ICD-10-CM | POA: Diagnosis not present

## 2020-06-19 DIAGNOSIS — Z45010 Encounter for checking and testing of cardiac pacemaker pulse generator [battery]: Secondary | ICD-10-CM | POA: Diagnosis not present

## 2020-06-19 DIAGNOSIS — Z941 Heart transplant status: Secondary | ICD-10-CM | POA: Diagnosis not present

## 2020-06-19 DIAGNOSIS — I120 Hypertensive chronic kidney disease with stage 5 chronic kidney disease or end stage renal disease: Secondary | ICD-10-CM | POA: Diagnosis not present

## 2020-06-20 DIAGNOSIS — Z992 Dependence on renal dialysis: Secondary | ICD-10-CM | POA: Diagnosis not present

## 2020-06-20 DIAGNOSIS — D84821 Immunodeficiency due to drugs: Secondary | ICD-10-CM | POA: Diagnosis present

## 2020-06-20 DIAGNOSIS — R85612 Low grade squamous intraepithelial lesion on cytologic smear of anus (LGSIL): Secondary | ICD-10-CM | POA: Diagnosis not present

## 2020-06-20 DIAGNOSIS — N186 End stage renal disease: Secondary | ICD-10-CM | POA: Diagnosis present

## 2020-06-20 DIAGNOSIS — I120 Hypertensive chronic kidney disease with stage 5 chronic kidney disease or end stage renal disease: Secondary | ICD-10-CM | POA: Diagnosis present

## 2020-06-20 DIAGNOSIS — F17210 Nicotine dependence, cigarettes, uncomplicated: Secondary | ICD-10-CM | POA: Diagnosis present

## 2020-06-20 DIAGNOSIS — E785 Hyperlipidemia, unspecified: Secondary | ICD-10-CM | POA: Diagnosis present

## 2020-06-20 DIAGNOSIS — D013 Carcinoma in situ of anus and anal canal: Secondary | ICD-10-CM | POA: Diagnosis not present

## 2020-06-20 DIAGNOSIS — A630 Anogenital (venereal) warts: Secondary | ICD-10-CM | POA: Diagnosis not present

## 2020-06-20 DIAGNOSIS — Z941 Heart transplant status: Secondary | ICD-10-CM | POA: Diagnosis not present

## 2020-06-20 DIAGNOSIS — R85613 High grade squamous intraepithelial lesion on cytologic smear of anus (HGSIL): Secondary | ICD-10-CM | POA: Diagnosis not present

## 2020-06-20 DIAGNOSIS — I442 Atrioventricular block, complete: Secondary | ICD-10-CM | POA: Diagnosis present

## 2020-06-24 DIAGNOSIS — Z992 Dependence on renal dialysis: Secondary | ICD-10-CM | POA: Diagnosis not present

## 2020-06-24 DIAGNOSIS — D509 Iron deficiency anemia, unspecified: Secondary | ICD-10-CM | POA: Diagnosis not present

## 2020-06-24 DIAGNOSIS — N250 Renal osteodystrophy: Secondary | ICD-10-CM | POA: Diagnosis not present

## 2020-06-24 DIAGNOSIS — Z23 Encounter for immunization: Secondary | ICD-10-CM | POA: Diagnosis not present

## 2020-06-24 DIAGNOSIS — D631 Anemia in chronic kidney disease: Secondary | ICD-10-CM | POA: Diagnosis not present

## 2020-06-24 DIAGNOSIS — N186 End stage renal disease: Secondary | ICD-10-CM | POA: Diagnosis not present

## 2020-06-26 DIAGNOSIS — N186 End stage renal disease: Secondary | ICD-10-CM | POA: Diagnosis not present

## 2020-06-26 DIAGNOSIS — D509 Iron deficiency anemia, unspecified: Secondary | ICD-10-CM | POA: Diagnosis not present

## 2020-06-26 DIAGNOSIS — D631 Anemia in chronic kidney disease: Secondary | ICD-10-CM | POA: Diagnosis not present

## 2020-06-26 DIAGNOSIS — Z992 Dependence on renal dialysis: Secondary | ICD-10-CM | POA: Diagnosis not present

## 2020-06-26 DIAGNOSIS — N250 Renal osteodystrophy: Secondary | ICD-10-CM | POA: Diagnosis not present

## 2020-06-26 DIAGNOSIS — Z23 Encounter for immunization: Secondary | ICD-10-CM | POA: Diagnosis not present

## 2020-06-28 DIAGNOSIS — D509 Iron deficiency anemia, unspecified: Secondary | ICD-10-CM | POA: Diagnosis not present

## 2020-06-28 DIAGNOSIS — Z992 Dependence on renal dialysis: Secondary | ICD-10-CM | POA: Diagnosis not present

## 2020-06-28 DIAGNOSIS — D631 Anemia in chronic kidney disease: Secondary | ICD-10-CM | POA: Diagnosis not present

## 2020-06-28 DIAGNOSIS — Z23 Encounter for immunization: Secondary | ICD-10-CM | POA: Diagnosis not present

## 2020-06-28 DIAGNOSIS — N250 Renal osteodystrophy: Secondary | ICD-10-CM | POA: Diagnosis not present

## 2020-06-28 DIAGNOSIS — N186 End stage renal disease: Secondary | ICD-10-CM | POA: Diagnosis not present

## 2020-07-01 DIAGNOSIS — Z299 Encounter for prophylactic measures, unspecified: Secondary | ICD-10-CM | POA: Diagnosis not present

## 2020-07-01 DIAGNOSIS — N250 Renal osteodystrophy: Secondary | ICD-10-CM | POA: Diagnosis not present

## 2020-07-01 DIAGNOSIS — F17210 Nicotine dependence, cigarettes, uncomplicated: Secondary | ICD-10-CM | POA: Diagnosis not present

## 2020-07-01 DIAGNOSIS — N186 End stage renal disease: Secondary | ICD-10-CM | POA: Diagnosis not present

## 2020-07-01 DIAGNOSIS — Z1331 Encounter for screening for depression: Secondary | ICD-10-CM | POA: Diagnosis not present

## 2020-07-01 DIAGNOSIS — Z1339 Encounter for screening examination for other mental health and behavioral disorders: Secondary | ICD-10-CM | POA: Diagnosis not present

## 2020-07-01 DIAGNOSIS — Z79899 Other long term (current) drug therapy: Secondary | ICD-10-CM | POA: Diagnosis not present

## 2020-07-01 DIAGNOSIS — Z7189 Other specified counseling: Secondary | ICD-10-CM | POA: Diagnosis not present

## 2020-07-01 DIAGNOSIS — Z992 Dependence on renal dialysis: Secondary | ICD-10-CM | POA: Diagnosis not present

## 2020-07-01 DIAGNOSIS — E7800 Pure hypercholesterolemia, unspecified: Secondary | ICD-10-CM | POA: Diagnosis not present

## 2020-07-01 DIAGNOSIS — D631 Anemia in chronic kidney disease: Secondary | ICD-10-CM | POA: Diagnosis not present

## 2020-07-01 DIAGNOSIS — R5383 Other fatigue: Secondary | ICD-10-CM | POA: Diagnosis not present

## 2020-07-01 DIAGNOSIS — Z23 Encounter for immunization: Secondary | ICD-10-CM | POA: Diagnosis not present

## 2020-07-01 DIAGNOSIS — D509 Iron deficiency anemia, unspecified: Secondary | ICD-10-CM | POA: Diagnosis not present

## 2020-07-01 DIAGNOSIS — Z0000 Encounter for general adult medical examination without abnormal findings: Secondary | ICD-10-CM | POA: Diagnosis not present

## 2020-07-07 DIAGNOSIS — Z992 Dependence on renal dialysis: Secondary | ICD-10-CM | POA: Diagnosis not present

## 2020-07-07 DIAGNOSIS — D509 Iron deficiency anemia, unspecified: Secondary | ICD-10-CM | POA: Diagnosis not present

## 2020-07-07 DIAGNOSIS — D631 Anemia in chronic kidney disease: Secondary | ICD-10-CM | POA: Diagnosis not present

## 2020-07-07 DIAGNOSIS — N250 Renal osteodystrophy: Secondary | ICD-10-CM | POA: Diagnosis not present

## 2020-07-07 DIAGNOSIS — Z23 Encounter for immunization: Secondary | ICD-10-CM | POA: Diagnosis not present

## 2020-07-07 DIAGNOSIS — N186 End stage renal disease: Secondary | ICD-10-CM | POA: Diagnosis not present

## 2020-07-08 DIAGNOSIS — Z992 Dependence on renal dialysis: Secondary | ICD-10-CM | POA: Diagnosis not present

## 2020-07-08 DIAGNOSIS — N250 Renal osteodystrophy: Secondary | ICD-10-CM | POA: Diagnosis not present

## 2020-07-08 DIAGNOSIS — D631 Anemia in chronic kidney disease: Secondary | ICD-10-CM | POA: Diagnosis not present

## 2020-07-08 DIAGNOSIS — N186 End stage renal disease: Secondary | ICD-10-CM | POA: Diagnosis not present

## 2020-07-08 DIAGNOSIS — Z23 Encounter for immunization: Secondary | ICD-10-CM | POA: Diagnosis not present

## 2020-07-08 DIAGNOSIS — D509 Iron deficiency anemia, unspecified: Secondary | ICD-10-CM | POA: Diagnosis not present

## 2020-07-10 DIAGNOSIS — D509 Iron deficiency anemia, unspecified: Secondary | ICD-10-CM | POA: Diagnosis not present

## 2020-07-10 DIAGNOSIS — N186 End stage renal disease: Secondary | ICD-10-CM | POA: Diagnosis not present

## 2020-07-10 DIAGNOSIS — N250 Renal osteodystrophy: Secondary | ICD-10-CM | POA: Diagnosis not present

## 2020-07-10 DIAGNOSIS — R110 Nausea: Secondary | ICD-10-CM | POA: Diagnosis not present

## 2020-07-10 DIAGNOSIS — Z992 Dependence on renal dialysis: Secondary | ICD-10-CM | POA: Diagnosis not present

## 2020-07-10 DIAGNOSIS — D631 Anemia in chronic kidney disease: Secondary | ICD-10-CM | POA: Diagnosis not present

## 2020-07-12 DIAGNOSIS — Z992 Dependence on renal dialysis: Secondary | ICD-10-CM | POA: Diagnosis not present

## 2020-07-12 DIAGNOSIS — D631 Anemia in chronic kidney disease: Secondary | ICD-10-CM | POA: Diagnosis not present

## 2020-07-12 DIAGNOSIS — R110 Nausea: Secondary | ICD-10-CM | POA: Diagnosis not present

## 2020-07-12 DIAGNOSIS — D509 Iron deficiency anemia, unspecified: Secondary | ICD-10-CM | POA: Diagnosis not present

## 2020-07-12 DIAGNOSIS — N250 Renal osteodystrophy: Secondary | ICD-10-CM | POA: Diagnosis not present

## 2020-07-12 DIAGNOSIS — N186 End stage renal disease: Secondary | ICD-10-CM | POA: Diagnosis not present

## 2020-07-15 DIAGNOSIS — R197 Diarrhea, unspecified: Secondary | ICD-10-CM | POA: Diagnosis not present

## 2020-07-17 DIAGNOSIS — D631 Anemia in chronic kidney disease: Secondary | ICD-10-CM | POA: Diagnosis not present

## 2020-07-17 DIAGNOSIS — R110 Nausea: Secondary | ICD-10-CM | POA: Diagnosis not present

## 2020-07-17 DIAGNOSIS — N186 End stage renal disease: Secondary | ICD-10-CM | POA: Diagnosis not present

## 2020-07-17 DIAGNOSIS — Z992 Dependence on renal dialysis: Secondary | ICD-10-CM | POA: Diagnosis not present

## 2020-07-17 DIAGNOSIS — N250 Renal osteodystrophy: Secondary | ICD-10-CM | POA: Diagnosis not present

## 2020-07-17 DIAGNOSIS — D509 Iron deficiency anemia, unspecified: Secondary | ICD-10-CM | POA: Diagnosis not present

## 2020-07-19 DIAGNOSIS — N250 Renal osteodystrophy: Secondary | ICD-10-CM | POA: Diagnosis not present

## 2020-07-19 DIAGNOSIS — N186 End stage renal disease: Secondary | ICD-10-CM | POA: Diagnosis not present

## 2020-07-19 DIAGNOSIS — D631 Anemia in chronic kidney disease: Secondary | ICD-10-CM | POA: Diagnosis not present

## 2020-07-19 DIAGNOSIS — Z992 Dependence on renal dialysis: Secondary | ICD-10-CM | POA: Diagnosis not present

## 2020-07-19 DIAGNOSIS — D509 Iron deficiency anemia, unspecified: Secondary | ICD-10-CM | POA: Diagnosis not present

## 2020-07-19 DIAGNOSIS — R110 Nausea: Secondary | ICD-10-CM | POA: Diagnosis not present

## 2020-07-22 DIAGNOSIS — Z992 Dependence on renal dialysis: Secondary | ICD-10-CM | POA: Diagnosis not present

## 2020-07-22 DIAGNOSIS — N186 End stage renal disease: Secondary | ICD-10-CM | POA: Diagnosis not present

## 2020-07-22 DIAGNOSIS — N250 Renal osteodystrophy: Secondary | ICD-10-CM | POA: Diagnosis not present

## 2020-07-22 DIAGNOSIS — D509 Iron deficiency anemia, unspecified: Secondary | ICD-10-CM | POA: Diagnosis not present

## 2020-07-22 DIAGNOSIS — R110 Nausea: Secondary | ICD-10-CM | POA: Diagnosis not present

## 2020-07-22 DIAGNOSIS — D631 Anemia in chronic kidney disease: Secondary | ICD-10-CM | POA: Diagnosis not present

## 2020-07-24 DIAGNOSIS — D631 Anemia in chronic kidney disease: Secondary | ICD-10-CM | POA: Diagnosis not present

## 2020-07-24 DIAGNOSIS — Z992 Dependence on renal dialysis: Secondary | ICD-10-CM | POA: Diagnosis not present

## 2020-07-24 DIAGNOSIS — D509 Iron deficiency anemia, unspecified: Secondary | ICD-10-CM | POA: Diagnosis not present

## 2020-07-24 DIAGNOSIS — N250 Renal osteodystrophy: Secondary | ICD-10-CM | POA: Diagnosis not present

## 2020-07-24 DIAGNOSIS — R110 Nausea: Secondary | ICD-10-CM | POA: Diagnosis not present

## 2020-07-24 DIAGNOSIS — N186 End stage renal disease: Secondary | ICD-10-CM | POA: Diagnosis not present

## 2020-07-26 DIAGNOSIS — R110 Nausea: Secondary | ICD-10-CM | POA: Diagnosis not present

## 2020-07-26 DIAGNOSIS — Z992 Dependence on renal dialysis: Secondary | ICD-10-CM | POA: Diagnosis not present

## 2020-07-26 DIAGNOSIS — D509 Iron deficiency anemia, unspecified: Secondary | ICD-10-CM | POA: Diagnosis not present

## 2020-07-26 DIAGNOSIS — D631 Anemia in chronic kidney disease: Secondary | ICD-10-CM | POA: Diagnosis not present

## 2020-07-26 DIAGNOSIS — N250 Renal osteodystrophy: Secondary | ICD-10-CM | POA: Diagnosis not present

## 2020-07-26 DIAGNOSIS — N186 End stage renal disease: Secondary | ICD-10-CM | POA: Diagnosis not present

## 2020-07-29 DIAGNOSIS — R110 Nausea: Secondary | ICD-10-CM | POA: Diagnosis not present

## 2020-07-29 DIAGNOSIS — N250 Renal osteodystrophy: Secondary | ICD-10-CM | POA: Diagnosis not present

## 2020-07-29 DIAGNOSIS — Z992 Dependence on renal dialysis: Secondary | ICD-10-CM | POA: Diagnosis not present

## 2020-07-29 DIAGNOSIS — D509 Iron deficiency anemia, unspecified: Secondary | ICD-10-CM | POA: Diagnosis not present

## 2020-07-29 DIAGNOSIS — D631 Anemia in chronic kidney disease: Secondary | ICD-10-CM | POA: Diagnosis not present

## 2020-07-29 DIAGNOSIS — N186 End stage renal disease: Secondary | ICD-10-CM | POA: Diagnosis not present

## 2020-07-30 DIAGNOSIS — K633 Ulcer of intestine: Secondary | ICD-10-CM | POA: Diagnosis not present

## 2020-07-30 DIAGNOSIS — Z9049 Acquired absence of other specified parts of digestive tract: Secondary | ICD-10-CM | POA: Diagnosis not present

## 2020-07-30 DIAGNOSIS — D849 Immunodeficiency, unspecified: Secondary | ICD-10-CM | POA: Diagnosis not present

## 2020-07-30 DIAGNOSIS — G8929 Other chronic pain: Secondary | ICD-10-CM | POA: Diagnosis not present

## 2020-07-30 DIAGNOSIS — D84821 Immunodeficiency due to drugs: Secondary | ICD-10-CM | POA: Diagnosis not present

## 2020-07-30 DIAGNOSIS — K5000 Crohn's disease of small intestine without complications: Secondary | ICD-10-CM | POA: Diagnosis not present

## 2020-07-30 DIAGNOSIS — Z980 Intestinal bypass and anastomosis status: Secondary | ICD-10-CM | POA: Diagnosis not present

## 2020-07-30 DIAGNOSIS — R197 Diarrhea, unspecified: Secondary | ICD-10-CM | POA: Diagnosis not present

## 2020-07-30 DIAGNOSIS — Z941 Heart transplant status: Secondary | ICD-10-CM | POA: Diagnosis not present

## 2020-07-30 DIAGNOSIS — A630 Anogenital (venereal) warts: Secondary | ICD-10-CM | POA: Diagnosis not present

## 2020-07-30 DIAGNOSIS — K529 Noninfective gastroenteritis and colitis, unspecified: Secondary | ICD-10-CM | POA: Diagnosis not present

## 2020-07-30 DIAGNOSIS — R1084 Generalized abdominal pain: Secondary | ICD-10-CM | POA: Diagnosis not present

## 2020-08-02 DIAGNOSIS — N186 End stage renal disease: Secondary | ICD-10-CM | POA: Diagnosis not present

## 2020-08-02 DIAGNOSIS — D509 Iron deficiency anemia, unspecified: Secondary | ICD-10-CM | POA: Diagnosis not present

## 2020-08-02 DIAGNOSIS — Z992 Dependence on renal dialysis: Secondary | ICD-10-CM | POA: Diagnosis not present

## 2020-08-02 DIAGNOSIS — D631 Anemia in chronic kidney disease: Secondary | ICD-10-CM | POA: Diagnosis not present

## 2020-08-02 DIAGNOSIS — R110 Nausea: Secondary | ICD-10-CM | POA: Diagnosis not present

## 2020-08-02 DIAGNOSIS — N250 Renal osteodystrophy: Secondary | ICD-10-CM | POA: Diagnosis not present

## 2020-08-05 DIAGNOSIS — D509 Iron deficiency anemia, unspecified: Secondary | ICD-10-CM | POA: Diagnosis not present

## 2020-08-05 DIAGNOSIS — D631 Anemia in chronic kidney disease: Secondary | ICD-10-CM | POA: Diagnosis not present

## 2020-08-05 DIAGNOSIS — R110 Nausea: Secondary | ICD-10-CM | POA: Diagnosis not present

## 2020-08-05 DIAGNOSIS — N186 End stage renal disease: Secondary | ICD-10-CM | POA: Diagnosis not present

## 2020-08-05 DIAGNOSIS — Z992 Dependence on renal dialysis: Secondary | ICD-10-CM | POA: Diagnosis not present

## 2020-08-05 DIAGNOSIS — N250 Renal osteodystrophy: Secondary | ICD-10-CM | POA: Diagnosis not present

## 2020-08-07 DIAGNOSIS — D509 Iron deficiency anemia, unspecified: Secondary | ICD-10-CM | POA: Diagnosis not present

## 2020-08-07 DIAGNOSIS — N250 Renal osteodystrophy: Secondary | ICD-10-CM | POA: Diagnosis not present

## 2020-08-07 DIAGNOSIS — I10 Essential (primary) hypertension: Secondary | ICD-10-CM | POA: Diagnosis not present

## 2020-08-07 DIAGNOSIS — D631 Anemia in chronic kidney disease: Secondary | ICD-10-CM | POA: Diagnosis not present

## 2020-08-07 DIAGNOSIS — Z992 Dependence on renal dialysis: Secondary | ICD-10-CM | POA: Diagnosis not present

## 2020-08-07 DIAGNOSIS — R110 Nausea: Secondary | ICD-10-CM | POA: Diagnosis not present

## 2020-08-07 DIAGNOSIS — E7800 Pure hypercholesterolemia, unspecified: Secondary | ICD-10-CM | POA: Diagnosis not present

## 2020-08-07 DIAGNOSIS — N186 End stage renal disease: Secondary | ICD-10-CM | POA: Diagnosis not present

## 2020-08-09 DIAGNOSIS — D509 Iron deficiency anemia, unspecified: Secondary | ICD-10-CM | POA: Diagnosis not present

## 2020-08-09 DIAGNOSIS — D631 Anemia in chronic kidney disease: Secondary | ICD-10-CM | POA: Diagnosis not present

## 2020-08-09 DIAGNOSIS — Z992 Dependence on renal dialysis: Secondary | ICD-10-CM | POA: Diagnosis not present

## 2020-08-09 DIAGNOSIS — N250 Renal osteodystrophy: Secondary | ICD-10-CM | POA: Diagnosis not present

## 2020-08-09 DIAGNOSIS — N186 End stage renal disease: Secondary | ICD-10-CM | POA: Diagnosis not present

## 2020-08-12 DIAGNOSIS — R85612 Low grade squamous intraepithelial lesion on cytologic smear of anus (LGSIL): Secondary | ICD-10-CM | POA: Diagnosis not present

## 2020-08-12 DIAGNOSIS — N250 Renal osteodystrophy: Secondary | ICD-10-CM | POA: Diagnosis not present

## 2020-08-12 DIAGNOSIS — A630 Anogenital (venereal) warts: Secondary | ICD-10-CM | POA: Diagnosis not present

## 2020-08-12 DIAGNOSIS — R85613 High grade squamous intraepithelial lesion on cytologic smear of anus (HGSIL): Secondary | ICD-10-CM | POA: Diagnosis not present

## 2020-08-12 DIAGNOSIS — Z992 Dependence on renal dialysis: Secondary | ICD-10-CM | POA: Diagnosis not present

## 2020-08-12 DIAGNOSIS — N186 End stage renal disease: Secondary | ICD-10-CM | POA: Diagnosis not present

## 2020-08-12 DIAGNOSIS — D631 Anemia in chronic kidney disease: Secondary | ICD-10-CM | POA: Diagnosis not present

## 2020-08-12 DIAGNOSIS — D013 Carcinoma in situ of anus and anal canal: Secondary | ICD-10-CM | POA: Diagnosis not present

## 2020-08-12 DIAGNOSIS — D509 Iron deficiency anemia, unspecified: Secondary | ICD-10-CM | POA: Diagnosis not present

## 2020-08-14 DIAGNOSIS — N186 End stage renal disease: Secondary | ICD-10-CM | POA: Diagnosis not present

## 2020-08-14 DIAGNOSIS — Z992 Dependence on renal dialysis: Secondary | ICD-10-CM | POA: Diagnosis not present

## 2020-08-14 DIAGNOSIS — N250 Renal osteodystrophy: Secondary | ICD-10-CM | POA: Diagnosis not present

## 2020-08-14 DIAGNOSIS — D509 Iron deficiency anemia, unspecified: Secondary | ICD-10-CM | POA: Diagnosis not present

## 2020-08-14 DIAGNOSIS — D631 Anemia in chronic kidney disease: Secondary | ICD-10-CM | POA: Diagnosis not present

## 2020-08-19 DIAGNOSIS — N186 End stage renal disease: Secondary | ICD-10-CM | POA: Diagnosis not present

## 2020-08-19 DIAGNOSIS — D631 Anemia in chronic kidney disease: Secondary | ICD-10-CM | POA: Diagnosis not present

## 2020-08-19 DIAGNOSIS — Z992 Dependence on renal dialysis: Secondary | ICD-10-CM | POA: Diagnosis not present

## 2020-08-19 DIAGNOSIS — N250 Renal osteodystrophy: Secondary | ICD-10-CM | POA: Diagnosis not present

## 2020-08-19 DIAGNOSIS — D509 Iron deficiency anemia, unspecified: Secondary | ICD-10-CM | POA: Diagnosis not present

## 2020-08-21 DIAGNOSIS — N186 End stage renal disease: Secondary | ICD-10-CM | POA: Diagnosis not present

## 2020-08-21 DIAGNOSIS — Z992 Dependence on renal dialysis: Secondary | ICD-10-CM | POA: Diagnosis not present

## 2020-08-21 DIAGNOSIS — D509 Iron deficiency anemia, unspecified: Secondary | ICD-10-CM | POA: Diagnosis not present

## 2020-08-21 DIAGNOSIS — N250 Renal osteodystrophy: Secondary | ICD-10-CM | POA: Diagnosis not present

## 2020-08-21 DIAGNOSIS — D631 Anemia in chronic kidney disease: Secondary | ICD-10-CM | POA: Diagnosis not present

## 2020-08-23 DIAGNOSIS — D631 Anemia in chronic kidney disease: Secondary | ICD-10-CM | POA: Diagnosis not present

## 2020-08-23 DIAGNOSIS — D509 Iron deficiency anemia, unspecified: Secondary | ICD-10-CM | POA: Diagnosis not present

## 2020-08-23 DIAGNOSIS — N250 Renal osteodystrophy: Secondary | ICD-10-CM | POA: Diagnosis not present

## 2020-08-23 DIAGNOSIS — N186 End stage renal disease: Secondary | ICD-10-CM | POA: Diagnosis not present

## 2020-08-23 DIAGNOSIS — Z992 Dependence on renal dialysis: Secondary | ICD-10-CM | POA: Diagnosis not present

## 2020-08-26 DIAGNOSIS — N186 End stage renal disease: Secondary | ICD-10-CM | POA: Diagnosis not present

## 2020-08-26 DIAGNOSIS — N250 Renal osteodystrophy: Secondary | ICD-10-CM | POA: Diagnosis not present

## 2020-08-26 DIAGNOSIS — D631 Anemia in chronic kidney disease: Secondary | ICD-10-CM | POA: Diagnosis not present

## 2020-08-26 DIAGNOSIS — D509 Iron deficiency anemia, unspecified: Secondary | ICD-10-CM | POA: Diagnosis not present

## 2020-08-26 DIAGNOSIS — Z992 Dependence on renal dialysis: Secondary | ICD-10-CM | POA: Diagnosis not present

## 2020-08-28 DIAGNOSIS — D509 Iron deficiency anemia, unspecified: Secondary | ICD-10-CM | POA: Diagnosis not present

## 2020-08-28 DIAGNOSIS — D631 Anemia in chronic kidney disease: Secondary | ICD-10-CM | POA: Diagnosis not present

## 2020-08-28 DIAGNOSIS — N250 Renal osteodystrophy: Secondary | ICD-10-CM | POA: Diagnosis not present

## 2020-08-28 DIAGNOSIS — Z992 Dependence on renal dialysis: Secondary | ICD-10-CM | POA: Diagnosis not present

## 2020-08-28 DIAGNOSIS — N186 End stage renal disease: Secondary | ICD-10-CM | POA: Diagnosis not present

## 2020-09-02 DIAGNOSIS — Z992 Dependence on renal dialysis: Secondary | ICD-10-CM | POA: Diagnosis not present

## 2020-09-02 DIAGNOSIS — N186 End stage renal disease: Secondary | ICD-10-CM | POA: Diagnosis not present

## 2020-09-02 DIAGNOSIS — D509 Iron deficiency anemia, unspecified: Secondary | ICD-10-CM | POA: Diagnosis not present

## 2020-09-02 DIAGNOSIS — N250 Renal osteodystrophy: Secondary | ICD-10-CM | POA: Diagnosis not present

## 2020-09-02 DIAGNOSIS — D631 Anemia in chronic kidney disease: Secondary | ICD-10-CM | POA: Diagnosis not present

## 2020-09-04 DIAGNOSIS — D509 Iron deficiency anemia, unspecified: Secondary | ICD-10-CM | POA: Diagnosis not present

## 2020-09-04 DIAGNOSIS — Z86018 Personal history of other benign neoplasm: Secondary | ICD-10-CM | POA: Diagnosis not present

## 2020-09-04 DIAGNOSIS — N250 Renal osteodystrophy: Secondary | ICD-10-CM | POA: Diagnosis not present

## 2020-09-04 DIAGNOSIS — N186 End stage renal disease: Secondary | ICD-10-CM | POA: Diagnosis not present

## 2020-09-04 DIAGNOSIS — D631 Anemia in chronic kidney disease: Secondary | ICD-10-CM | POA: Diagnosis not present

## 2020-09-04 DIAGNOSIS — Z992 Dependence on renal dialysis: Secondary | ICD-10-CM | POA: Diagnosis not present

## 2020-09-04 DIAGNOSIS — Z09 Encounter for follow-up examination after completed treatment for conditions other than malignant neoplasm: Secondary | ICD-10-CM | POA: Diagnosis not present

## 2020-09-05 DIAGNOSIS — I10 Essential (primary) hypertension: Secondary | ICD-10-CM | POA: Diagnosis not present

## 2020-09-05 DIAGNOSIS — E7800 Pure hypercholesterolemia, unspecified: Secondary | ICD-10-CM | POA: Diagnosis not present

## 2020-09-06 DIAGNOSIS — N250 Renal osteodystrophy: Secondary | ICD-10-CM | POA: Diagnosis not present

## 2020-09-06 DIAGNOSIS — D509 Iron deficiency anemia, unspecified: Secondary | ICD-10-CM | POA: Diagnosis not present

## 2020-09-06 DIAGNOSIS — N186 End stage renal disease: Secondary | ICD-10-CM | POA: Diagnosis not present

## 2020-09-06 DIAGNOSIS — Z992 Dependence on renal dialysis: Secondary | ICD-10-CM | POA: Diagnosis not present

## 2020-09-06 DIAGNOSIS — D631 Anemia in chronic kidney disease: Secondary | ICD-10-CM | POA: Diagnosis not present

## 2020-09-07 DIAGNOSIS — N186 End stage renal disease: Secondary | ICD-10-CM | POA: Diagnosis not present

## 2020-09-07 DIAGNOSIS — Z992 Dependence on renal dialysis: Secondary | ICD-10-CM | POA: Diagnosis not present

## 2020-09-08 DIAGNOSIS — Z992 Dependence on renal dialysis: Secondary | ICD-10-CM | POA: Diagnosis not present

## 2020-09-08 DIAGNOSIS — D631 Anemia in chronic kidney disease: Secondary | ICD-10-CM | POA: Diagnosis not present

## 2020-09-08 DIAGNOSIS — N250 Renal osteodystrophy: Secondary | ICD-10-CM | POA: Diagnosis not present

## 2020-09-08 DIAGNOSIS — N186 End stage renal disease: Secondary | ICD-10-CM | POA: Diagnosis not present

## 2020-09-08 DIAGNOSIS — D509 Iron deficiency anemia, unspecified: Secondary | ICD-10-CM | POA: Diagnosis not present

## 2020-09-09 DIAGNOSIS — Z992 Dependence on renal dialysis: Secondary | ICD-10-CM | POA: Diagnosis not present

## 2020-09-09 DIAGNOSIS — D631 Anemia in chronic kidney disease: Secondary | ICD-10-CM | POA: Diagnosis not present

## 2020-09-09 DIAGNOSIS — N250 Renal osteodystrophy: Secondary | ICD-10-CM | POA: Diagnosis not present

## 2020-09-09 DIAGNOSIS — D509 Iron deficiency anemia, unspecified: Secondary | ICD-10-CM | POA: Diagnosis not present

## 2020-09-09 DIAGNOSIS — N186 End stage renal disease: Secondary | ICD-10-CM | POA: Diagnosis not present

## 2020-09-11 ENCOUNTER — Emergency Department (HOSPITAL_COMMUNITY)
Admission: EM | Admit: 2020-09-11 | Discharge: 2020-09-11 | Disposition: A | Discharge status: Home / Self Care | Payer: Medicare Other | Attending: Emergency Medicine | Admitting: Emergency Medicine

## 2020-09-11 ENCOUNTER — Encounter (HOSPITAL_COMMUNITY): Payer: Self-pay | Admitting: Emergency Medicine

## 2020-09-11 ENCOUNTER — Other Ambulatory Visit: Payer: Self-pay

## 2020-09-11 ENCOUNTER — Emergency Department (HOSPITAL_COMMUNITY): Payer: Medicare Other

## 2020-09-11 DIAGNOSIS — Z950 Presence of cardiac pacemaker: Secondary | ICD-10-CM | POA: Diagnosis not present

## 2020-09-11 DIAGNOSIS — I517 Cardiomegaly: Secondary | ICD-10-CM | POA: Diagnosis not present

## 2020-09-11 DIAGNOSIS — F17210 Nicotine dependence, cigarettes, uncomplicated: Secondary | ICD-10-CM | POA: Insufficient documentation

## 2020-09-11 DIAGNOSIS — R0789 Other chest pain: Secondary | ICD-10-CM | POA: Diagnosis not present

## 2020-09-11 DIAGNOSIS — Z79899 Other long term (current) drug therapy: Secondary | ICD-10-CM | POA: Insufficient documentation

## 2020-09-11 DIAGNOSIS — N186 End stage renal disease: Secondary | ICD-10-CM | POA: Diagnosis not present

## 2020-09-11 DIAGNOSIS — K219 Gastro-esophageal reflux disease without esophagitis: Secondary | ICD-10-CM | POA: Diagnosis not present

## 2020-09-11 DIAGNOSIS — I120 Hypertensive chronic kidney disease with stage 5 chronic kidney disease or end stage renal disease: Secondary | ICD-10-CM | POA: Insufficient documentation

## 2020-09-11 DIAGNOSIS — Z7982 Long term (current) use of aspirin: Secondary | ICD-10-CM | POA: Diagnosis not present

## 2020-09-11 DIAGNOSIS — Z941 Heart transplant status: Secondary | ICD-10-CM | POA: Insufficient documentation

## 2020-09-11 DIAGNOSIS — Z992 Dependence on renal dialysis: Secondary | ICD-10-CM | POA: Diagnosis not present

## 2020-09-11 DIAGNOSIS — R079 Chest pain, unspecified: Secondary | ICD-10-CM | POA: Diagnosis not present

## 2020-09-11 LAB — BASIC METABOLIC PANEL
Anion gap: 13 (ref 5–15)
BUN: 41 mg/dL — ABNORMAL HIGH (ref 6–20)
CO2: 25 mmol/L (ref 22–32)
Calcium: 12 mg/dL — ABNORMAL HIGH (ref 8.9–10.3)
Chloride: 98 mmol/L (ref 98–111)
Creatinine, Ser: 14.24 mg/dL — ABNORMAL HIGH (ref 0.61–1.24)
GFR, Estimated: 4 mL/min — ABNORMAL LOW (ref >60)
Glucose, Bld: 98 mg/dL (ref 70–99)
Potassium: 4.8 mmol/L (ref 3.5–5.1)
Sodium: 136 mmol/L (ref 135–145)

## 2020-09-11 LAB — CBC
HCT: 33.7 % — ABNORMAL LOW (ref 39.0–52.0)
Hemoglobin: 11.8 g/dL — ABNORMAL LOW (ref 13.0–17.0)
MCH: 30.4 pg (ref 26.0–34.0)
MCHC: 35 g/dL (ref 30.0–36.0)
MCV: 86.9 fL (ref 80.0–100.0)
Platelets: 152 10*3/uL (ref 150–400)
RBC: 3.88 MIL/uL — ABNORMAL LOW (ref 4.22–5.81)
RDW: 16 % — ABNORMAL HIGH (ref 11.5–15.5)
WBC: 12 10*3/uL — ABNORMAL HIGH (ref 4.0–10.5)
nRBC: 0 % (ref 0.0–0.2)

## 2020-09-11 LAB — TROPONIN I (HIGH SENSITIVITY)
Troponin I (High Sensitivity): 28 ng/L — ABNORMAL HIGH (ref <18)
Troponin I (High Sensitivity): 27 ng/L — ABNORMAL HIGH (ref <18)

## 2020-09-11 LAB — LIPASE, BLOOD: Lipase: 44 U/L (ref 11–51)

## 2020-09-11 MED ORDER — ALUM & MAG HYDROXIDE-SIMETH 200-200-20 MG/5ML PO SUSP
30.0000 mL | Freq: Once | ORAL | Status: AC
  Administered 2020-09-11: 30 mL via ORAL
  Filled 2020-09-11: qty 30

## 2020-09-11 MED ORDER — LIDOCAINE VISCOUS HCL 2 % MT SOLN
15.0000 mL | Freq: Once | OROMUCOSAL | Status: AC
  Administered 2020-09-11: 15 mL via ORAL
  Filled 2020-09-11: qty 15

## 2020-09-11 NOTE — Discharge Instructions (Signed)
Today for chest pain, this is most likely actually acid reflux.  I want you to continue to take your acid reflux medications, you can change over to the liquid as Dr. Eulis Foster told you to.  Please use the following instructions.  If your chest pain becomes worse, radiates down into her left arm, you become nauseous with sweatiness, you become short of breath or you have any new or worsening concerning symptoms please come back to the emergency department.  I want you to follow-up with your cardiologist and your primary care in the next couple of days.

## 2020-09-11 NOTE — ED Triage Notes (Signed)
Pt reports left sided chest pain and nausea that started last night. Pt had heart transplant in 2001.

## 2020-09-11 NOTE — ED Notes (Signed)
Fistula noted to left forearm. Bruit and thrill present. Pt was supposed to be diaylsis today at 1215pm. Last done on Tues.

## 2020-09-11 NOTE — ED Provider Notes (Signed)
[facility] EMERGENCY DEPARTMENT Provider Note   CSN: [id number] Arrival date & time: 09/11/20  1121     History Chief Complaint  Patient presents with  . Chest Pain    Antonio Powell is a 44 y.o. male with pertinent past medical history of nonischemic cardiomyopathy status post BiV-ICD and status post open heart transplant in 2001, ESRD on dialysis, hypertension that that presents the emergency department today for chest pain.  Per chart review patient underwent aortic valve replacement bioprosthetic in 2019 and also went underwent AVR with root replacement in 2020 as well as placement of a pacemaker.  Patient presents today with left-sided chest pain with nausea that started last night.  States that he normally has nausea due to his dialysis treatments, however it normally subsides with nausea medication.  States that last night his nausea was not able to subside.  Describes his chest pain as chest pressure, does not radiate anywhere.  States that he felt as if he needed to belch, however was not able to.  States that he took Tums without any relief because it felt like his typical indigestion, however wanted to come to the ER to be sure.  Denies any shortness of breath.  Does take baby aspirin daily, was able to take all of his other medications.  No other blood thinners.  Denies any abdominal pain, vomiting.  States that he started smoking tobacco in October again, does admit to infrequent cough from this.  No new diarrhea.  No fevers, chills, diaphoresis, weakness, numbness, tingling.  Pain does not radiate anywhere.  Has not taken anything for this.  States that he mainly gets most of his cardiology care at [facility].  States that he is not nauseous currently.  Last cardiac cath in 2019 with minimal disease, last stress test in 2021 without any notable abnormalities.  Echo in 2021 with EF 60 to 65%.  HPI     Past Medical History:  Diagnosis Date  . Heart transplant  recipient [facility])   . Hypertension     There are no problems to display for this patient.   Past Surgical History:  Procedure Laterality Date  . APPENDECTOMY    . CARDIAC SURGERY    . CHOLECYSTECTOMY    . HERNIA REPAIR    . left ear         No family history on file.  Social History   Tobacco Use  . Smoking status: Current Every Day Smoker    Packs/day: 0.50    Types: Cigarettes  . Smokeless tobacco: Never Used  Substance Use Topics  . Alcohol use: Yes    Comment: occ  . Drug use: Yes    Types: Marijuana    Home Medications Prior to Admission medications   Medication Sig Start Date End Date Taking? Authorizing Provider  allopurinol (ZYLOPRIM) 100 MG tablet Take 100 mg by mouth daily.    [provider]  aspirin 81 MG chewable tablet Chew 81 mg by mouth daily.    [provider]  carvedilol (COREG) 6.25 MG tablet Take 6.25 mg by mouth 2 (two) times daily.     [provider]  diltiazem (TIAZAC) 360 MG 24 hr capsule Take 360 mg by mouth daily.    [provider]  folic acid (FOLVITE) 1 MG tablet Take 1 mg by mouth daily.    [provider]  furosemide (LASIX) 20 MG tablet Take 20 mg by mouth daily.  [provider]  hydrALAZINE (APRESOLINE) 50 MG tablet Take 50 mg by mouth 3 (three) times daily.    [provider]  iron polysaccharides (NIFEREX) 150 MG capsule Take 150 mg by mouth daily.    [provider]  linaclotide (LINZESS) 145 MCG CAPS capsule Take 145 mcg by mouth daily before breakfast.    [provider]  magnesium oxide (MAG-OX) 400 MG tablet Take 400 mg by mouth daily.    [provider]  Multiple Vitamins-Minerals (MULTIVITAMIN ADULT) TABS Take 1 tablet by mouth daily.    [provider]  pravastatin (PRAVACHOL) 40 MG tablet Take 40 mg by mouth daily.    [provider]  ranitidine (ZANTAC) 150 MG tablet Take 150 mg by mouth 2 (two) times daily.     [provider]  sirolimus (RAPAMUNE) 1 MG tablet Take 1 mg by mouth daily.    [provider]  tacrolimus (PROGRAF) 1 MG capsule Take 1 mg by mouth 2 (two) times daily.    [provider]  traMADol (ULTRAM) 50 MG tablet Take 1 tablet (50 mg total) by mouth every 6 (six) hours as needed. Patient not taking: Reported on 08/25/2018 09/04/17   Triplett, Lynelle Smoke, PA-C    Allergies    Nsaids, Tolmetin, Varenicline, and Phenergan [promethazine hcl]  Review of Systems   Review of Systems  Constitutional: Negative for chills, diaphoresis, fatigue and fever.  HENT: Negative for congestion, sore throat and trouble swallowing.   Eyes: Negative for pain and visual disturbance.  Respiratory: Negative for cough, shortness of breath and wheezing.   Cardiovascular: Positive for chest pain. Negative for palpitations and leg swelling.  Gastrointestinal: Positive for nausea. Negative for abdominal distention, abdominal pain, diarrhea and vomiting.  Genitourinary: Negative for difficulty urinating.  Musculoskeletal: Negative for back pain, neck pain and neck stiffness.  Skin: Negative for pallor.  Neurological: Negative for dizziness, speech difficulty, weakness and headaches.  Psychiatric/Behavioral: Negative for confusion.    Physical Exam Updated Vital Signs BP (!) 140/91   Pulse 89   Temp 97.6 F (36.4 C) (Oral)   Resp 16   Ht 5\' 11"  (1.803 m)   Wt 63.5 kg   SpO2 100%   BMI 19.52 kg/m   Physical Exam Constitutional:      General: He is not in acute distress.    Appearance: Normal appearance. He is not ill-appearing, toxic-appearing or diaphoretic.  HENT:     Mouth/Throat:     Mouth: Mucous membranes are moist.     Pharynx: Oropharynx is clear.  Eyes:     General: No scleral icterus.    Extraocular Movements: Extraocular movements intact.     Pupils: Pupils are equal, round, and reactive to light.  Cardiovascular:     Rate and Rhythm: Normal rate and regular  rhythm.     Pulses: Normal pulses.     Heart sounds: Normal heart sounds.  Pulmonary:     Effort: Pulmonary effort is normal. No respiratory distress.     Breath sounds: Normal breath sounds. No stridor. No wheezing, rhonchi or rales.  Chest:     Chest wall: Tenderness present.  Abdominal:     General: Abdomen is flat. There is no distension.     Palpations: Abdomen is soft.     Tenderness: There is abdominal tenderness (Epigastric ). There is no guarding or rebound.  Musculoskeletal:        General: No swelling or tenderness. Normal range of motion.  Cervical back: Normal range of motion and neck supple. No rigidity.     Right lower leg: No edema.     Left lower leg: No edema.  Skin:    General: Skin is warm and dry.     Capillary Refill: Capillary refill takes less than 2 seconds.     Coloration: Skin is not pale.  Neurological:     General: No focal deficit present.     Mental Status: He is alert and oriented to person, place, and time.  Psychiatric:        Mood and Affect: Mood normal.        Behavior: Behavior normal.     ED Results / Procedures / Treatments   Labs (all labs ordered are listed, but only abnormal results are displayed) Labs Reviewed  BASIC METABOLIC PANEL - Abnormal; Notable for the following components:      Result Value   BUN 41 (*)    Creatinine, Ser 14.24 (*)    Calcium 12.0 (*)    GFR, Estimated 4 (*)    All other components within normal limits  CBC - Abnormal; Notable for the following components:   WBC 12.0 (*)    RBC 3.88 (*)    Hemoglobin 11.8 (*)    HCT 33.7 (*)    RDW 16.0 (*)    All other components within normal limits  TROPONIN I (HIGH SENSITIVITY) - Abnormal; Notable for the following components:   Troponin I (High Sensitivity) 28 (*)    All other components within normal limits  TROPONIN I (HIGH SENSITIVITY) - Abnormal; Notable for the following components:   Troponin I (High Sensitivity) 27 (*)    All other components  within normal limits  LIPASE, BLOOD    EKG   Radiology DG Chest 2 View  Result Date: 09/11/2020 CLINICAL DATA:  Chest pain.  History of COVID. EXAM: CHEST - 2 VIEW COMPARISON:  10/31/2017. FINDINGS: Prior median sternotomy and cardiac valve replacement. Stable cardiomegaly. No pulmonary venous congestion. Interval partial resolution of bilateral interstitial infiltrates. Mild residual. Small bilateral pleural effusions versus pleural scarring again noted. No pneumothorax. IMPRESSION: 1. Prior median sternotomy and cardiac valve replacement. Stable cardiomegaly. 2. Interval partial resolution of bilateral interstitial infiltrates. Mild residual. Small bilateral pleural effusions versus pleural scarring again noted. Electronically Signed   By: Marcello Moores  Register   On: 09/11/2020 13:25    Procedures Procedures (including critical care time)  Medications Ordered in ED Medications  alum & mag hydroxide-simeth (MAALOX/MYLANTA) 200-200-20 MG/5ML suspension 30 mL (30 mLs Oral Given 09/11/20 1412)    And  lidocaine (XYLOCAINE) 2 % viscous mouth solution 15 mL (15 mLs Oral Given 09/11/20 1412)    ED Course  I have reviewed the triage vital signs and the nursing notes.  Pertinent labs & imaging results that were available during my care of the patient were reviewed by me and considered in my medical decision making (see chart for details).    MDM Rules/Calculators/A&P                         Antonio Powell is a 44 y.o. male with pertinent past medical history of nonischemic cardiomyopathy status post BiV-ICD and status post open heart transplant in 2001, ESRD on dialysis, hypertension that that presents the emergency department today for chest pain.  The emergent causes of chest pain include: Acute coronary syndrome, tamponade, pericarditis/myocarditis, aortic dissection, pulmonary embolism, tension pneumothorax, pneumonia, and esophageal  rupture.  My suspicion is that chest pain is less likely  cardiac since it is reproducible on exam, patient also has slight epigastric pain.  Will obtain labs and initial interventions include GI cocktail.  ECG interpreted by me demonstrated no acute abnormalities, QTC prolonged 503, prior to this it was higher 2 years ago.  CXR interpreted by me demonstrated no acute cardiopulmonary disease.  Labs demonstrated small leukocytosis of 12, this could be reactive.  Hemoglobin stable at 11.8  First troponin 28, second troponin 27.  BMP with electrolyte abnormalities due to ERSD on dialysis, patient does go Tuesday Thursday Saturday.  Did not miss last dialysis appointment.  Normal lipase.  Upon reassessment patient states that he feels much better with GI cocktail, states that he thinks that this was also abdominal ain origin. No more epigastric pain, chest pain lessened a lot.  Given the above findings, my suspicion is that patient most likely has acid reflux, symptomatic treatment discussed and patient and medication, patient will have close follow-up with PCP.  In regards to cardiac testing, less likely to be ischemic since minimal disease with cath in 2019, patient has very close cardio follow-up at [facility].  Strict return precautions given.  Patient is to be discharged with recommendation to follow up with PCP in regards to today's hospital visit. Chest pain is not likely of cardiac or pulmonary etiology d/t presentation, PERC negative, VSS, no tracheal deviation, no JVD or new murmur, RRR, breath sounds equal bilaterally, EKG without acute abnormalities, flat troponins, and negative CXR. Pt has been advised to return to the ED if CP becomes exertional, associated with diaphoresis or nausea, radiates to left jaw/arm, worsens or becomes concerning in any way. Pt appears reliable for follow up and is agreeable to discharge.   I discussed this case with my attending physician who cosigned this note including patient's presenting symptoms, physical exam,  and planned diagnostics and interventions. Attending physician stated agreement with plan or made changes to plan which were implemented.   Attending physician assessed patient at bedside.    Final Clinical Impression(s) / ED Diagnoses Final diagnoses:  Gastroesophageal reflux disease without esophagitis    Rx / DC Orders ED Discharge Orders    None       Alfredia Client, PA-C 09/11/20 1531    Daleen Bo, MD 09/12/20 2321

## 2020-09-11 NOTE — ED Provider Notes (Signed)
  Face-to-face evaluation   History: He presents for evaluation of pressure sensation in the chest, which comes and goes and feels like "gas."  Pain worse with being supine last night while trying to sleep.  He has chronic heartburn symptoms and uses Protonix and Tums.  Physical exam: Alert, calm, cooperative.  No dysarthria or aphasia.  He is lucid.  No respiratory distress.  Chest nontender to palpation, upper abdomen is tender.  Medical screening examination/treatment/procedure(s) were conducted as a shared visit with non-physician practitioner(s) and myself.  I personally evaluated the patient during the encounter    Daleen Bo, MD 09/12/20 2321

## 2020-09-13 DIAGNOSIS — D509 Iron deficiency anemia, unspecified: Secondary | ICD-10-CM | POA: Diagnosis not present

## 2020-09-13 DIAGNOSIS — N250 Renal osteodystrophy: Secondary | ICD-10-CM | POA: Diagnosis not present

## 2020-09-13 DIAGNOSIS — Z992 Dependence on renal dialysis: Secondary | ICD-10-CM | POA: Diagnosis not present

## 2020-09-13 DIAGNOSIS — N186 End stage renal disease: Secondary | ICD-10-CM | POA: Diagnosis not present

## 2020-09-13 DIAGNOSIS — D631 Anemia in chronic kidney disease: Secondary | ICD-10-CM | POA: Diagnosis not present

## 2020-09-16 DIAGNOSIS — Z79899 Other long term (current) drug therapy: Secondary | ICD-10-CM | POA: Diagnosis not present

## 2020-09-16 DIAGNOSIS — R1084 Generalized abdominal pain: Secondary | ICD-10-CM | POA: Diagnosis not present

## 2020-09-16 DIAGNOSIS — Z992 Dependence on renal dialysis: Secondary | ICD-10-CM | POA: Diagnosis not present

## 2020-09-16 DIAGNOSIS — R109 Unspecified abdominal pain: Secondary | ICD-10-CM | POA: Diagnosis not present

## 2020-09-16 DIAGNOSIS — R197 Diarrhea, unspecified: Secondary | ICD-10-CM | POA: Diagnosis not present

## 2020-09-16 DIAGNOSIS — N186 End stage renal disease: Secondary | ICD-10-CM | POA: Diagnosis not present

## 2020-09-16 DIAGNOSIS — N250 Renal osteodystrophy: Secondary | ICD-10-CM | POA: Diagnosis not present

## 2020-09-16 DIAGNOSIS — D631 Anemia in chronic kidney disease: Secondary | ICD-10-CM | POA: Diagnosis not present

## 2020-09-16 DIAGNOSIS — R159 Full incontinence of feces: Secondary | ICD-10-CM | POA: Diagnosis not present

## 2020-09-16 DIAGNOSIS — D509 Iron deficiency anemia, unspecified: Secondary | ICD-10-CM | POA: Diagnosis not present

## 2020-09-20 DIAGNOSIS — D631 Anemia in chronic kidney disease: Secondary | ICD-10-CM | POA: Diagnosis not present

## 2020-09-20 DIAGNOSIS — D509 Iron deficiency anemia, unspecified: Secondary | ICD-10-CM | POA: Diagnosis not present

## 2020-09-20 DIAGNOSIS — Z992 Dependence on renal dialysis: Secondary | ICD-10-CM | POA: Diagnosis not present

## 2020-09-20 DIAGNOSIS — N250 Renal osteodystrophy: Secondary | ICD-10-CM | POA: Diagnosis not present

## 2020-09-20 DIAGNOSIS — N186 End stage renal disease: Secondary | ICD-10-CM | POA: Diagnosis not present

## 2020-09-23 DIAGNOSIS — D509 Iron deficiency anemia, unspecified: Secondary | ICD-10-CM | POA: Diagnosis not present

## 2020-09-23 DIAGNOSIS — D631 Anemia in chronic kidney disease: Secondary | ICD-10-CM | POA: Diagnosis not present

## 2020-09-23 DIAGNOSIS — N250 Renal osteodystrophy: Secondary | ICD-10-CM | POA: Diagnosis not present

## 2020-09-23 DIAGNOSIS — Z992 Dependence on renal dialysis: Secondary | ICD-10-CM | POA: Diagnosis not present

## 2020-09-23 DIAGNOSIS — N186 End stage renal disease: Secondary | ICD-10-CM | POA: Diagnosis not present

## 2020-09-24 DIAGNOSIS — Z299 Encounter for prophylactic measures, unspecified: Secondary | ICD-10-CM | POA: Diagnosis not present

## 2020-09-24 DIAGNOSIS — Z2821 Immunization not carried out because of patient refusal: Secondary | ICD-10-CM | POA: Diagnosis not present

## 2020-09-24 DIAGNOSIS — L089 Local infection of the skin and subcutaneous tissue, unspecified: Secondary | ICD-10-CM | POA: Diagnosis not present

## 2020-09-24 DIAGNOSIS — D582 Other hemoglobinopathies: Secondary | ICD-10-CM | POA: Diagnosis not present

## 2020-09-24 DIAGNOSIS — I10 Essential (primary) hypertension: Secondary | ICD-10-CM | POA: Diagnosis not present

## 2020-09-24 DIAGNOSIS — Z87891 Personal history of nicotine dependence: Secondary | ICD-10-CM | POA: Diagnosis not present

## 2020-09-24 DIAGNOSIS — L03019 Cellulitis of unspecified finger: Secondary | ICD-10-CM | POA: Diagnosis not present

## 2020-09-25 DIAGNOSIS — Z992 Dependence on renal dialysis: Secondary | ICD-10-CM | POA: Diagnosis not present

## 2020-09-25 DIAGNOSIS — D509 Iron deficiency anemia, unspecified: Secondary | ICD-10-CM | POA: Diagnosis not present

## 2020-09-25 DIAGNOSIS — N186 End stage renal disease: Secondary | ICD-10-CM | POA: Diagnosis not present

## 2020-09-25 DIAGNOSIS — N250 Renal osteodystrophy: Secondary | ICD-10-CM | POA: Diagnosis not present

## 2020-09-25 DIAGNOSIS — D631 Anemia in chronic kidney disease: Secondary | ICD-10-CM | POA: Diagnosis not present

## 2020-09-30 DIAGNOSIS — Z992 Dependence on renal dialysis: Secondary | ICD-10-CM | POA: Diagnosis not present

## 2020-09-30 DIAGNOSIS — D631 Anemia in chronic kidney disease: Secondary | ICD-10-CM | POA: Diagnosis not present

## 2020-09-30 DIAGNOSIS — N186 End stage renal disease: Secondary | ICD-10-CM | POA: Diagnosis not present

## 2020-09-30 DIAGNOSIS — D509 Iron deficiency anemia, unspecified: Secondary | ICD-10-CM | POA: Diagnosis not present

## 2020-09-30 DIAGNOSIS — N250 Renal osteodystrophy: Secondary | ICD-10-CM | POA: Diagnosis not present

## 2020-10-04 DIAGNOSIS — Z992 Dependence on renal dialysis: Secondary | ICD-10-CM | POA: Diagnosis not present

## 2020-10-04 DIAGNOSIS — N186 End stage renal disease: Secondary | ICD-10-CM | POA: Diagnosis not present

## 2020-10-04 DIAGNOSIS — D631 Anemia in chronic kidney disease: Secondary | ICD-10-CM | POA: Diagnosis not present

## 2020-10-04 DIAGNOSIS — N250 Renal osteodystrophy: Secondary | ICD-10-CM | POA: Diagnosis not present

## 2020-10-04 DIAGNOSIS — D509 Iron deficiency anemia, unspecified: Secondary | ICD-10-CM | POA: Diagnosis not present

## 2020-10-06 DIAGNOSIS — B079 Viral wart, unspecified: Secondary | ICD-10-CM | POA: Diagnosis not present

## 2020-10-06 DIAGNOSIS — L729 Follicular cyst of the skin and subcutaneous tissue, unspecified: Secondary | ICD-10-CM | POA: Diagnosis not present

## 2020-10-07 DIAGNOSIS — D631 Anemia in chronic kidney disease: Secondary | ICD-10-CM | POA: Diagnosis not present

## 2020-10-07 DIAGNOSIS — E7800 Pure hypercholesterolemia, unspecified: Secondary | ICD-10-CM | POA: Diagnosis not present

## 2020-10-07 DIAGNOSIS — N250 Renal osteodystrophy: Secondary | ICD-10-CM | POA: Diagnosis not present

## 2020-10-07 DIAGNOSIS — I10 Essential (primary) hypertension: Secondary | ICD-10-CM | POA: Diagnosis not present

## 2020-10-07 DIAGNOSIS — N186 End stage renal disease: Secondary | ICD-10-CM | POA: Diagnosis not present

## 2020-10-07 DIAGNOSIS — Z992 Dependence on renal dialysis: Secondary | ICD-10-CM | POA: Diagnosis not present

## 2020-10-07 DIAGNOSIS — D509 Iron deficiency anemia, unspecified: Secondary | ICD-10-CM | POA: Diagnosis not present

## 2020-10-08 DIAGNOSIS — M109 Gout, unspecified: Secondary | ICD-10-CM | POA: Diagnosis not present

## 2020-10-08 DIAGNOSIS — N250 Renal osteodystrophy: Secondary | ICD-10-CM | POA: Diagnosis not present

## 2020-10-08 DIAGNOSIS — Z941 Heart transplant status: Secondary | ICD-10-CM | POA: Diagnosis not present

## 2020-10-08 DIAGNOSIS — Z992 Dependence on renal dialysis: Secondary | ICD-10-CM | POA: Diagnosis not present

## 2020-10-08 DIAGNOSIS — Z299 Encounter for prophylactic measures, unspecified: Secondary | ICD-10-CM | POA: Diagnosis not present

## 2020-10-08 DIAGNOSIS — D509 Iron deficiency anemia, unspecified: Secondary | ICD-10-CM | POA: Diagnosis not present

## 2020-10-08 DIAGNOSIS — R110 Nausea: Secondary | ICD-10-CM | POA: Diagnosis not present

## 2020-10-08 DIAGNOSIS — N185 Chronic kidney disease, stage 5: Secondary | ICD-10-CM | POA: Diagnosis not present

## 2020-10-08 DIAGNOSIS — N186 End stage renal disease: Secondary | ICD-10-CM | POA: Diagnosis not present

## 2020-10-08 DIAGNOSIS — I10 Essential (primary) hypertension: Secondary | ICD-10-CM | POA: Diagnosis not present

## 2020-10-08 DIAGNOSIS — D631 Anemia in chronic kidney disease: Secondary | ICD-10-CM | POA: Diagnosis not present

## 2020-10-09 DIAGNOSIS — N186 End stage renal disease: Secondary | ICD-10-CM | POA: Diagnosis not present

## 2020-10-09 DIAGNOSIS — D631 Anemia in chronic kidney disease: Secondary | ICD-10-CM | POA: Diagnosis not present

## 2020-10-09 DIAGNOSIS — N250 Renal osteodystrophy: Secondary | ICD-10-CM | POA: Diagnosis not present

## 2020-10-09 DIAGNOSIS — R110 Nausea: Secondary | ICD-10-CM | POA: Diagnosis not present

## 2020-10-09 DIAGNOSIS — D509 Iron deficiency anemia, unspecified: Secondary | ICD-10-CM | POA: Diagnosis not present

## 2020-10-09 DIAGNOSIS — Z992 Dependence on renal dialysis: Secondary | ICD-10-CM | POA: Diagnosis not present

## 2020-10-13 DIAGNOSIS — N179 Acute kidney failure, unspecified: Secondary | ICD-10-CM | POA: Diagnosis not present

## 2020-10-13 DIAGNOSIS — Z20822 Contact with and (suspected) exposure to covid-19: Secondary | ICD-10-CM | POA: Diagnosis not present

## 2020-10-13 DIAGNOSIS — R700 Elevated erythrocyte sedimentation rate: Secondary | ICD-10-CM | POA: Diagnosis not present

## 2020-10-13 DIAGNOSIS — Z941 Heart transplant status: Secondary | ICD-10-CM | POA: Diagnosis not present

## 2020-10-13 DIAGNOSIS — R9431 Abnormal electrocardiogram [ECG] [EKG]: Secondary | ICD-10-CM | POA: Diagnosis not present

## 2020-10-13 DIAGNOSIS — R778 Other specified abnormalities of plasma proteins: Secondary | ICD-10-CM | POA: Diagnosis not present

## 2020-10-13 DIAGNOSIS — J189 Pneumonia, unspecified organism: Secondary | ICD-10-CM | POA: Diagnosis not present

## 2020-10-13 DIAGNOSIS — I517 Cardiomegaly: Secondary | ICD-10-CM | POA: Diagnosis not present

## 2020-10-13 DIAGNOSIS — H6093 Unspecified otitis externa, bilateral: Secondary | ICD-10-CM | POA: Diagnosis not present

## 2020-10-13 DIAGNOSIS — I4510 Unspecified right bundle-branch block: Secondary | ICD-10-CM | POA: Diagnosis not present

## 2020-10-13 DIAGNOSIS — D72829 Elevated white blood cell count, unspecified: Secondary | ICD-10-CM | POA: Diagnosis not present

## 2020-10-13 DIAGNOSIS — N189 Chronic kidney disease, unspecified: Secondary | ICD-10-CM | POA: Diagnosis not present

## 2020-10-14 DIAGNOSIS — D509 Iron deficiency anemia, unspecified: Secondary | ICD-10-CM | POA: Diagnosis not present

## 2020-10-14 DIAGNOSIS — Z992 Dependence on renal dialysis: Secondary | ICD-10-CM | POA: Diagnosis not present

## 2020-10-14 DIAGNOSIS — D631 Anemia in chronic kidney disease: Secondary | ICD-10-CM | POA: Diagnosis not present

## 2020-10-14 DIAGNOSIS — N250 Renal osteodystrophy: Secondary | ICD-10-CM | POA: Diagnosis not present

## 2020-10-14 DIAGNOSIS — N186 End stage renal disease: Secondary | ICD-10-CM | POA: Diagnosis not present

## 2020-10-14 DIAGNOSIS — R110 Nausea: Secondary | ICD-10-CM | POA: Diagnosis not present

## 2020-10-15 DIAGNOSIS — Z9889 Other specified postprocedural states: Secondary | ICD-10-CM | POA: Diagnosis not present

## 2020-10-15 DIAGNOSIS — Z950 Presence of cardiac pacemaker: Secondary | ICD-10-CM | POA: Diagnosis not present

## 2020-10-15 DIAGNOSIS — I4510 Unspecified right bundle-branch block: Secondary | ICD-10-CM | POA: Diagnosis not present

## 2020-10-15 DIAGNOSIS — I444 Left anterior fascicular block: Secondary | ICD-10-CM | POA: Diagnosis not present

## 2020-10-15 DIAGNOSIS — Z941 Heart transplant status: Secondary | ICD-10-CM | POA: Diagnosis not present

## 2020-10-15 DIAGNOSIS — I517 Cardiomegaly: Secondary | ICD-10-CM | POA: Diagnosis not present

## 2020-10-15 DIAGNOSIS — I452 Bifascicular block: Secondary | ICD-10-CM | POA: Diagnosis not present

## 2020-10-15 DIAGNOSIS — I4430 Unspecified atrioventricular block: Secondary | ICD-10-CM | POA: Diagnosis not present

## 2020-10-15 DIAGNOSIS — I442 Atrioventricular block, complete: Secondary | ICD-10-CM | POA: Diagnosis not present

## 2020-10-16 DIAGNOSIS — I452 Bifascicular block: Secondary | ICD-10-CM | POA: Diagnosis not present

## 2020-10-16 DIAGNOSIS — D509 Iron deficiency anemia, unspecified: Secondary | ICD-10-CM | POA: Diagnosis not present

## 2020-10-16 DIAGNOSIS — I517 Cardiomegaly: Secondary | ICD-10-CM | POA: Diagnosis not present

## 2020-10-16 DIAGNOSIS — N186 End stage renal disease: Secondary | ICD-10-CM | POA: Diagnosis not present

## 2020-10-16 DIAGNOSIS — Z992 Dependence on renal dialysis: Secondary | ICD-10-CM | POA: Diagnosis not present

## 2020-10-16 DIAGNOSIS — D631 Anemia in chronic kidney disease: Secondary | ICD-10-CM | POA: Diagnosis not present

## 2020-10-16 DIAGNOSIS — N250 Renal osteodystrophy: Secondary | ICD-10-CM | POA: Diagnosis not present

## 2020-10-16 DIAGNOSIS — R110 Nausea: Secondary | ICD-10-CM | POA: Diagnosis not present

## 2020-10-18 DIAGNOSIS — Z992 Dependence on renal dialysis: Secondary | ICD-10-CM | POA: Diagnosis not present

## 2020-10-18 DIAGNOSIS — N186 End stage renal disease: Secondary | ICD-10-CM | POA: Diagnosis not present

## 2020-10-18 DIAGNOSIS — R110 Nausea: Secondary | ICD-10-CM | POA: Diagnosis not present

## 2020-10-18 DIAGNOSIS — N250 Renal osteodystrophy: Secondary | ICD-10-CM | POA: Diagnosis not present

## 2020-10-18 DIAGNOSIS — D631 Anemia in chronic kidney disease: Secondary | ICD-10-CM | POA: Diagnosis not present

## 2020-10-18 DIAGNOSIS — D509 Iron deficiency anemia, unspecified: Secondary | ICD-10-CM | POA: Diagnosis not present

## 2020-10-21 DIAGNOSIS — D509 Iron deficiency anemia, unspecified: Secondary | ICD-10-CM | POA: Diagnosis not present

## 2020-10-21 DIAGNOSIS — D631 Anemia in chronic kidney disease: Secondary | ICD-10-CM | POA: Diagnosis not present

## 2020-10-21 DIAGNOSIS — N250 Renal osteodystrophy: Secondary | ICD-10-CM | POA: Diagnosis not present

## 2020-10-21 DIAGNOSIS — Z992 Dependence on renal dialysis: Secondary | ICD-10-CM | POA: Diagnosis not present

## 2020-10-21 DIAGNOSIS — N186 End stage renal disease: Secondary | ICD-10-CM | POA: Diagnosis not present

## 2020-10-21 DIAGNOSIS — R110 Nausea: Secondary | ICD-10-CM | POA: Diagnosis not present

## 2020-10-22 DIAGNOSIS — H6690 Otitis media, unspecified, unspecified ear: Secondary | ICD-10-CM | POA: Diagnosis not present

## 2020-10-22 DIAGNOSIS — I517 Cardiomegaly: Secondary | ICD-10-CM | POA: Diagnosis not present

## 2020-10-22 DIAGNOSIS — J189 Pneumonia, unspecified organism: Secondary | ICD-10-CM | POA: Diagnosis not present

## 2020-10-22 DIAGNOSIS — N185 Chronic kidney disease, stage 5: Secondary | ICD-10-CM | POA: Diagnosis not present

## 2020-10-22 DIAGNOSIS — I10 Essential (primary) hypertension: Secondary | ICD-10-CM | POA: Diagnosis not present

## 2020-10-22 DIAGNOSIS — Z941 Heart transplant status: Secondary | ICD-10-CM | POA: Diagnosis not present

## 2020-10-22 DIAGNOSIS — Z299 Encounter for prophylactic measures, unspecified: Secondary | ICD-10-CM | POA: Diagnosis not present

## 2020-10-25 DIAGNOSIS — N250 Renal osteodystrophy: Secondary | ICD-10-CM | POA: Diagnosis not present

## 2020-10-25 DIAGNOSIS — D631 Anemia in chronic kidney disease: Secondary | ICD-10-CM | POA: Diagnosis not present

## 2020-10-25 DIAGNOSIS — Z992 Dependence on renal dialysis: Secondary | ICD-10-CM | POA: Diagnosis not present

## 2020-10-25 DIAGNOSIS — N186 End stage renal disease: Secondary | ICD-10-CM | POA: Diagnosis not present

## 2020-10-25 DIAGNOSIS — D509 Iron deficiency anemia, unspecified: Secondary | ICD-10-CM | POA: Diagnosis not present

## 2020-10-25 DIAGNOSIS — R110 Nausea: Secondary | ICD-10-CM | POA: Diagnosis not present

## 2020-10-27 DIAGNOSIS — H6690 Otitis media, unspecified, unspecified ear: Secondary | ICD-10-CM | POA: Diagnosis not present

## 2020-10-27 DIAGNOSIS — K5090 Crohn's disease, unspecified, without complications: Secondary | ICD-10-CM | POA: Diagnosis not present

## 2020-10-27 DIAGNOSIS — Z299 Encounter for prophylactic measures, unspecified: Secondary | ICD-10-CM | POA: Diagnosis not present

## 2020-10-27 DIAGNOSIS — I10 Essential (primary) hypertension: Secondary | ICD-10-CM | POA: Diagnosis not present

## 2020-10-27 DIAGNOSIS — E7800 Pure hypercholesterolemia, unspecified: Secondary | ICD-10-CM | POA: Diagnosis not present

## 2020-10-27 DIAGNOSIS — H6121 Impacted cerumen, right ear: Secondary | ICD-10-CM | POA: Diagnosis not present

## 2020-10-28 DIAGNOSIS — N250 Renal osteodystrophy: Secondary | ICD-10-CM | POA: Diagnosis not present

## 2020-10-28 DIAGNOSIS — N186 End stage renal disease: Secondary | ICD-10-CM | POA: Diagnosis not present

## 2020-10-28 DIAGNOSIS — D631 Anemia in chronic kidney disease: Secondary | ICD-10-CM | POA: Diagnosis not present

## 2020-10-28 DIAGNOSIS — D509 Iron deficiency anemia, unspecified: Secondary | ICD-10-CM | POA: Diagnosis not present

## 2020-10-28 DIAGNOSIS — R110 Nausea: Secondary | ICD-10-CM | POA: Diagnosis not present

## 2020-10-28 DIAGNOSIS — Z992 Dependence on renal dialysis: Secondary | ICD-10-CM | POA: Diagnosis not present

## 2020-10-30 DIAGNOSIS — R110 Nausea: Secondary | ICD-10-CM | POA: Diagnosis not present

## 2020-10-30 DIAGNOSIS — D509 Iron deficiency anemia, unspecified: Secondary | ICD-10-CM | POA: Diagnosis not present

## 2020-10-30 DIAGNOSIS — Z992 Dependence on renal dialysis: Secondary | ICD-10-CM | POA: Diagnosis not present

## 2020-10-30 DIAGNOSIS — N250 Renal osteodystrophy: Secondary | ICD-10-CM | POA: Diagnosis not present

## 2020-10-30 DIAGNOSIS — N186 End stage renal disease: Secondary | ICD-10-CM | POA: Diagnosis not present

## 2020-10-30 DIAGNOSIS — D631 Anemia in chronic kidney disease: Secondary | ICD-10-CM | POA: Diagnosis not present

## 2020-11-04 DIAGNOSIS — R110 Nausea: Secondary | ICD-10-CM | POA: Diagnosis not present

## 2020-11-04 DIAGNOSIS — N250 Renal osteodystrophy: Secondary | ICD-10-CM | POA: Diagnosis not present

## 2020-11-04 DIAGNOSIS — D509 Iron deficiency anemia, unspecified: Secondary | ICD-10-CM | POA: Diagnosis not present

## 2020-11-04 DIAGNOSIS — Z992 Dependence on renal dialysis: Secondary | ICD-10-CM | POA: Diagnosis not present

## 2020-11-04 DIAGNOSIS — D631 Anemia in chronic kidney disease: Secondary | ICD-10-CM | POA: Diagnosis not present

## 2020-11-04 DIAGNOSIS — N186 End stage renal disease: Secondary | ICD-10-CM | POA: Diagnosis not present

## 2020-11-06 DIAGNOSIS — I10 Essential (primary) hypertension: Secondary | ICD-10-CM | POA: Diagnosis not present

## 2020-11-06 DIAGNOSIS — E7800 Pure hypercholesterolemia, unspecified: Secondary | ICD-10-CM | POA: Diagnosis not present

## 2020-11-07 DIAGNOSIS — N186 End stage renal disease: Secondary | ICD-10-CM | POA: Diagnosis not present

## 2020-11-07 DIAGNOSIS — Z992 Dependence on renal dialysis: Secondary | ICD-10-CM | POA: Diagnosis not present

## 2020-11-25 DIAGNOSIS — K920 Hematemesis: Secondary | ICD-10-CM | POA: Insufficient documentation

## 2020-11-25 DIAGNOSIS — U071 COVID-19: Secondary | ICD-10-CM | POA: Insufficient documentation

## 2020-11-25 DIAGNOSIS — J9601 Acute respiratory failure with hypoxia: Secondary | ICD-10-CM | POA: Insufficient documentation

## 2020-11-26 DIAGNOSIS — A4189 Other specified sepsis: Secondary | ICD-10-CM | POA: Insufficient documentation

## 2020-11-26 DIAGNOSIS — U071 COVID-19: Secondary | ICD-10-CM | POA: Insufficient documentation

## 2020-11-30 DIAGNOSIS — G9340 Encephalopathy, unspecified: Secondary | ICD-10-CM | POA: Insufficient documentation

## 2020-12-01 DIAGNOSIS — R9389 Abnormal findings on diagnostic imaging of other specified body structures: Secondary | ICD-10-CM | POA: Insufficient documentation

## 2020-12-01 DIAGNOSIS — W19XXXA Unspecified fall, initial encounter: Secondary | ICD-10-CM | POA: Insufficient documentation

## 2020-12-05 DIAGNOSIS — N492 Inflammatory disorders of scrotum: Secondary | ICD-10-CM | POA: Insufficient documentation

## 2021-01-06 DIAGNOSIS — N186 End stage renal disease: Secondary | ICD-10-CM | POA: Diagnosis not present

## 2021-01-06 DIAGNOSIS — Z992 Dependence on renal dialysis: Secondary | ICD-10-CM | POA: Diagnosis not present

## 2021-01-07 DIAGNOSIS — R1031 Right lower quadrant pain: Secondary | ICD-10-CM | POA: Diagnosis not present

## 2021-01-07 DIAGNOSIS — Z79899 Other long term (current) drug therapy: Secondary | ICD-10-CM | POA: Diagnosis not present

## 2021-01-07 DIAGNOSIS — D62 Acute posthemorrhagic anemia: Secondary | ICD-10-CM | POA: Diagnosis not present

## 2021-01-07 DIAGNOSIS — U071 COVID-19: Secondary | ICD-10-CM | POA: Diagnosis not present

## 2021-01-07 DIAGNOSIS — Z941 Heart transplant status: Secondary | ICD-10-CM | POA: Diagnosis not present

## 2021-01-07 DIAGNOSIS — I442 Atrioventricular block, complete: Secondary | ICD-10-CM | POA: Diagnosis not present

## 2021-01-07 DIAGNOSIS — J9600 Acute respiratory failure, unspecified whether with hypoxia or hypercapnia: Secondary | ICD-10-CM | POA: Diagnosis not present

## 2021-01-07 DIAGNOSIS — N186 End stage renal disease: Secondary | ICD-10-CM | POA: Diagnosis not present

## 2021-01-07 DIAGNOSIS — Z87891 Personal history of nicotine dependence: Secondary | ICD-10-CM | POA: Diagnosis not present

## 2021-01-07 DIAGNOSIS — Z515 Encounter for palliative care: Secondary | ICD-10-CM | POA: Diagnosis not present

## 2021-01-07 DIAGNOSIS — I132 Hypertensive heart and chronic kidney disease with heart failure and with stage 5 chronic kidney disease, or end stage renal disease: Secondary | ICD-10-CM | POA: Diagnosis not present

## 2021-01-07 DIAGNOSIS — K920 Hematemesis: Secondary | ICD-10-CM | POA: Diagnosis not present

## 2021-01-07 DIAGNOSIS — D84821 Immunodeficiency due to drugs: Secondary | ICD-10-CM | POA: Diagnosis not present

## 2021-01-07 DIAGNOSIS — C218 Malignant neoplasm of overlapping sites of rectum, anus and anal canal: Secondary | ICD-10-CM | POA: Diagnosis not present

## 2021-01-07 DIAGNOSIS — Z4821 Encounter for aftercare following heart transplant: Secondary | ICD-10-CM | POA: Diagnosis not present

## 2021-01-07 DIAGNOSIS — A4189 Other specified sepsis: Secondary | ICD-10-CM | POA: Diagnosis not present

## 2021-01-07 DIAGNOSIS — I25811 Atherosclerosis of native coronary artery of transplanted heart without angina pectoris: Secondary | ICD-10-CM | POA: Diagnosis not present

## 2021-01-07 DIAGNOSIS — K56699 Other intestinal obstruction unspecified as to partial versus complete obstruction: Secondary | ICD-10-CM | POA: Diagnosis not present

## 2021-01-07 DIAGNOSIS — Z992 Dependence on renal dialysis: Secondary | ICD-10-CM | POA: Diagnosis not present

## 2021-01-07 DIAGNOSIS — Z7982 Long term (current) use of aspirin: Secondary | ICD-10-CM | POA: Diagnosis not present

## 2021-01-07 DIAGNOSIS — I358 Other nonrheumatic aortic valve disorders: Secondary | ICD-10-CM | POA: Diagnosis not present

## 2021-01-07 DIAGNOSIS — D631 Anemia in chronic kidney disease: Secondary | ICD-10-CM | POA: Diagnosis not present

## 2021-01-07 DIAGNOSIS — I120 Hypertensive chronic kidney disease with stage 5 chronic kidney disease or end stage renal disease: Secondary | ICD-10-CM | POA: Diagnosis not present

## 2021-01-08 DIAGNOSIS — Z992 Dependence on renal dialysis: Secondary | ICD-10-CM | POA: Diagnosis not present

## 2021-01-08 DIAGNOSIS — N186 End stage renal disease: Secondary | ICD-10-CM | POA: Diagnosis not present

## 2021-01-08 DIAGNOSIS — N250 Renal osteodystrophy: Secondary | ICD-10-CM | POA: Diagnosis not present

## 2021-01-09 DIAGNOSIS — Z8719 Personal history of other diseases of the digestive system: Secondary | ICD-10-CM | POA: Diagnosis not present

## 2021-01-09 DIAGNOSIS — Z8616 Personal history of COVID-19: Secondary | ICD-10-CM | POA: Diagnosis not present

## 2021-01-09 DIAGNOSIS — K3189 Other diseases of stomach and duodenum: Secondary | ICD-10-CM | POA: Diagnosis not present

## 2021-01-09 DIAGNOSIS — K222 Esophageal obstruction: Secondary | ICD-10-CM | POA: Diagnosis not present

## 2021-01-09 DIAGNOSIS — J9601 Acute respiratory failure with hypoxia: Secondary | ICD-10-CM | POA: Diagnosis not present

## 2021-01-09 DIAGNOSIS — K317 Polyp of stomach and duodenum: Secondary | ICD-10-CM | POA: Diagnosis not present

## 2021-01-09 DIAGNOSIS — Z87891 Personal history of nicotine dependence: Secondary | ICD-10-CM | POA: Diagnosis not present

## 2021-01-09 DIAGNOSIS — I120 Hypertensive chronic kidney disease with stage 5 chronic kidney disease or end stage renal disease: Secondary | ICD-10-CM | POA: Diagnosis not present

## 2021-01-09 DIAGNOSIS — K2980 Duodenitis without bleeding: Secondary | ICD-10-CM | POA: Diagnosis not present

## 2021-01-09 DIAGNOSIS — N186 End stage renal disease: Secondary | ICD-10-CM | POA: Diagnosis not present

## 2021-01-09 DIAGNOSIS — Z992 Dependence on renal dialysis: Secondary | ICD-10-CM | POA: Diagnosis not present

## 2021-01-09 DIAGNOSIS — K259 Gastric ulcer, unspecified as acute or chronic, without hemorrhage or perforation: Secondary | ICD-10-CM | POA: Diagnosis not present

## 2021-01-09 DIAGNOSIS — K209 Esophagitis, unspecified without bleeding: Secondary | ICD-10-CM | POA: Diagnosis not present

## 2021-01-09 DIAGNOSIS — E440 Moderate protein-calorie malnutrition: Secondary | ICD-10-CM | POA: Diagnosis not present

## 2021-01-09 DIAGNOSIS — I2510 Atherosclerotic heart disease of native coronary artery without angina pectoris: Secondary | ICD-10-CM | POA: Diagnosis not present

## 2021-01-09 DIAGNOSIS — Z941 Heart transplant status: Secondary | ICD-10-CM | POA: Diagnosis not present

## 2021-01-09 DIAGNOSIS — L89612 Pressure ulcer of right heel, stage 2: Secondary | ICD-10-CM | POA: Diagnosis not present

## 2021-01-09 DIAGNOSIS — K208 Other esophagitis without bleeding: Secondary | ICD-10-CM | POA: Diagnosis not present

## 2021-01-09 DIAGNOSIS — J1282 Pneumonia due to coronavirus disease 2019: Secondary | ICD-10-CM | POA: Diagnosis not present

## 2021-01-09 DIAGNOSIS — U071 COVID-19: Secondary | ICD-10-CM | POA: Diagnosis not present

## 2021-01-09 DIAGNOSIS — D62 Acute posthemorrhagic anemia: Secondary | ICD-10-CM | POA: Diagnosis not present

## 2021-01-09 DIAGNOSIS — L89892 Pressure ulcer of other site, stage 2: Secondary | ICD-10-CM | POA: Diagnosis not present

## 2021-01-09 DIAGNOSIS — M6281 Muscle weakness (generalized): Secondary | ICD-10-CM | POA: Diagnosis not present

## 2021-01-09 DIAGNOSIS — Z952 Presence of prosthetic heart valve: Secondary | ICD-10-CM | POA: Diagnosis not present

## 2021-01-10 DIAGNOSIS — Z992 Dependence on renal dialysis: Secondary | ICD-10-CM | POA: Diagnosis not present

## 2021-01-10 DIAGNOSIS — N186 End stage renal disease: Secondary | ICD-10-CM | POA: Diagnosis not present

## 2021-01-12 DIAGNOSIS — Z299 Encounter for prophylactic measures, unspecified: Secondary | ICD-10-CM | POA: Diagnosis not present

## 2021-01-12 DIAGNOSIS — Z8616 Personal history of COVID-19: Secondary | ICD-10-CM | POA: Diagnosis not present

## 2021-01-12 DIAGNOSIS — I10 Essential (primary) hypertension: Secondary | ICD-10-CM | POA: Diagnosis not present

## 2021-01-12 DIAGNOSIS — Z992 Dependence on renal dialysis: Secondary | ICD-10-CM | POA: Diagnosis not present

## 2021-01-12 DIAGNOSIS — Z87891 Personal history of nicotine dependence: Secondary | ICD-10-CM | POA: Diagnosis not present

## 2021-01-12 DIAGNOSIS — L8990 Pressure ulcer of unspecified site, unspecified stage: Secondary | ICD-10-CM | POA: Diagnosis not present

## 2021-01-12 DIAGNOSIS — Z6820 Body mass index (BMI) 20.0-20.9, adult: Secondary | ICD-10-CM | POA: Diagnosis not present

## 2021-01-13 DIAGNOSIS — Z992 Dependence on renal dialysis: Secondary | ICD-10-CM | POA: Diagnosis not present

## 2021-01-13 DIAGNOSIS — N186 End stage renal disease: Secondary | ICD-10-CM | POA: Diagnosis not present

## 2021-01-14 DIAGNOSIS — Z8719 Personal history of other diseases of the digestive system: Secondary | ICD-10-CM | POA: Diagnosis not present

## 2021-01-14 DIAGNOSIS — L89892 Pressure ulcer of other site, stage 2: Secondary | ICD-10-CM | POA: Diagnosis not present

## 2021-01-14 DIAGNOSIS — E440 Moderate protein-calorie malnutrition: Secondary | ICD-10-CM | POA: Diagnosis not present

## 2021-01-14 DIAGNOSIS — I2510 Atherosclerotic heart disease of native coronary artery without angina pectoris: Secondary | ICD-10-CM | POA: Diagnosis not present

## 2021-01-14 DIAGNOSIS — L89612 Pressure ulcer of right heel, stage 2: Secondary | ICD-10-CM | POA: Diagnosis not present

## 2021-01-14 DIAGNOSIS — I120 Hypertensive chronic kidney disease with stage 5 chronic kidney disease or end stage renal disease: Secondary | ICD-10-CM | POA: Diagnosis not present

## 2021-01-14 DIAGNOSIS — D62 Acute posthemorrhagic anemia: Secondary | ICD-10-CM | POA: Diagnosis not present

## 2021-01-14 DIAGNOSIS — Z941 Heart transplant status: Secondary | ICD-10-CM | POA: Diagnosis not present

## 2021-01-14 DIAGNOSIS — J1282 Pneumonia due to coronavirus disease 2019: Secondary | ICD-10-CM | POA: Diagnosis not present

## 2021-01-14 DIAGNOSIS — U071 COVID-19: Secondary | ICD-10-CM | POA: Diagnosis not present

## 2021-01-14 DIAGNOSIS — M6281 Muscle weakness (generalized): Secondary | ICD-10-CM | POA: Diagnosis not present

## 2021-01-14 DIAGNOSIS — N186 End stage renal disease: Secondary | ICD-10-CM | POA: Diagnosis not present

## 2021-01-14 DIAGNOSIS — J9601 Acute respiratory failure with hypoxia: Secondary | ICD-10-CM | POA: Diagnosis not present

## 2021-01-14 DIAGNOSIS — Z952 Presence of prosthetic heart valve: Secondary | ICD-10-CM | POA: Diagnosis not present

## 2021-01-14 DIAGNOSIS — Z992 Dependence on renal dialysis: Secondary | ICD-10-CM | POA: Diagnosis not present

## 2021-01-15 DIAGNOSIS — Z992 Dependence on renal dialysis: Secondary | ICD-10-CM | POA: Diagnosis not present

## 2021-01-15 DIAGNOSIS — N186 End stage renal disease: Secondary | ICD-10-CM | POA: Diagnosis not present

## 2021-01-16 ENCOUNTER — Encounter (HOSPITAL_COMMUNITY): Payer: Self-pay | Admitting: *Deleted

## 2021-01-16 ENCOUNTER — Other Ambulatory Visit: Payer: Self-pay

## 2021-01-16 ENCOUNTER — Observation Stay (HOSPITAL_COMMUNITY)
Admission: EM | Admit: 2021-01-16 | Discharge: 2021-01-17 | Disposition: A | Discharge status: Home / Self Care | Payer: Medicare Other | Attending: Internal Medicine | Admitting: Internal Medicine

## 2021-01-16 DIAGNOSIS — Z7982 Long term (current) use of aspirin: Secondary | ICD-10-CM | POA: Diagnosis not present

## 2021-01-16 DIAGNOSIS — Z79899 Other long term (current) drug therapy: Secondary | ICD-10-CM | POA: Insufficient documentation

## 2021-01-16 DIAGNOSIS — I120 Hypertensive chronic kidney disease with stage 5 chronic kidney disease or end stage renal disease: Secondary | ICD-10-CM | POA: Diagnosis not present

## 2021-01-16 DIAGNOSIS — Z20822 Contact with and (suspected) exposure to covid-19: Secondary | ICD-10-CM | POA: Insufficient documentation

## 2021-01-16 DIAGNOSIS — Z992 Dependence on renal dialysis: Secondary | ICD-10-CM | POA: Diagnosis not present

## 2021-01-16 DIAGNOSIS — N186 End stage renal disease: Secondary | ICD-10-CM | POA: Diagnosis not present

## 2021-01-16 DIAGNOSIS — F17210 Nicotine dependence, cigarettes, uncomplicated: Secondary | ICD-10-CM | POA: Insufficient documentation

## 2021-01-16 DIAGNOSIS — Z941 Heart transplant status: Secondary | ICD-10-CM | POA: Diagnosis not present

## 2021-01-16 DIAGNOSIS — D631 Anemia in chronic kidney disease: Secondary | ICD-10-CM | POA: Insufficient documentation

## 2021-01-16 DIAGNOSIS — D649 Anemia, unspecified: Secondary | ICD-10-CM | POA: Diagnosis present

## 2021-01-16 LAB — IRON AND TIBC
Iron: 45 ug/dL (ref 45–182)
Saturation Ratios: 18 % (ref 17.9–39.5)
TIBC: 245 ug/dL — ABNORMAL LOW (ref 250–450)
UIBC: 200 ug/dL

## 2021-01-16 LAB — CBC WITH DIFFERENTIAL/PLATELET
Abs Immature Granulocytes: 0.04 10*3/uL (ref 0.00–0.07)
Basophils Absolute: 0 10*3/uL (ref 0.0–0.1)
Basophils Relative: 0 %
Eosinophils Absolute: 0.2 10*3/uL (ref 0.0–0.5)
Eosinophils Relative: 2 %
HCT: 20.4 % — ABNORMAL LOW (ref 39.0–52.0)
Hemoglobin: 6.8 g/dL — CL (ref 13.0–17.0)
Immature Granulocytes: 0 %
Lymphocytes Relative: 22 %
Lymphs Abs: 2 10*3/uL (ref 0.7–4.0)
MCH: 30.6 pg (ref 26.0–34.0)
MCHC: 33.3 g/dL (ref 30.0–36.0)
MCV: 91.9 fL (ref 80.0–100.0)
Monocytes Absolute: 0.8 10*3/uL (ref 0.1–1.0)
Monocytes Relative: 9 %
Neutro Abs: 5.9 10*3/uL (ref 1.7–7.7)
Neutrophils Relative %: 67 %
Platelets: 230 10*3/uL (ref 150–400)
RBC: 2.22 MIL/uL — ABNORMAL LOW (ref 4.22–5.81)
RDW: 15.6 % — ABNORMAL HIGH (ref 11.5–15.5)
WBC: 8.9 10*3/uL (ref 4.0–10.5)
nRBC: 0 % (ref 0.0–0.2)

## 2021-01-16 LAB — HEMOGLOBIN AND HEMATOCRIT, BLOOD
HCT: 23.6 % — ABNORMAL LOW (ref 39.0–52.0)
Hemoglobin: 8 g/dL — ABNORMAL LOW (ref 13.0–17.0)

## 2021-01-16 LAB — RETICULOCYTES
Immature Retic Fract: 28.7 % — ABNORMAL HIGH (ref 2.3–15.9)
RBC.: 2.19 MIL/uL — ABNORMAL LOW (ref 4.22–5.81)
Retic Count, Absolute: 73.6 10*3/uL (ref 19.0–186.0)
Retic Ct Pct: 3.3 % — ABNORMAL HIGH (ref 0.4–3.1)

## 2021-01-16 LAB — BASIC METABOLIC PANEL
Anion gap: 16 — ABNORMAL HIGH (ref 5–15)
BUN: 24 mg/dL — ABNORMAL HIGH (ref 6–20)
CO2: 27 mmol/L (ref 22–32)
Calcium: 8 mg/dL — ABNORMAL LOW (ref 8.9–10.3)
Chloride: 97 mmol/L — ABNORMAL LOW (ref 98–111)
Creatinine, Ser: 6.18 mg/dL — ABNORMAL HIGH (ref 0.61–1.24)
GFR, Estimated: 11 mL/min — ABNORMAL LOW (ref >60)
Glucose, Bld: 129 mg/dL — ABNORMAL HIGH (ref 70–99)
Potassium: 2.8 mmol/L — ABNORMAL LOW (ref 3.5–5.1)
Sodium: 140 mmol/L (ref 135–145)

## 2021-01-16 LAB — HIV ANTIBODY (ROUTINE TESTING W REFLEX): HIV Screen 4th Generation wRfx: NONREACTIVE

## 2021-01-16 LAB — FERRITIN: Ferritin: 889 ng/mL — ABNORMAL HIGH (ref 24–336)

## 2021-01-16 LAB — ABO/RH: ABO/RH(D): A POS

## 2021-01-16 LAB — VITAMIN B12: Vitamin B-12: 266 pg/mL (ref 180–914)

## 2021-01-16 LAB — PREPARE RBC (CROSSMATCH)

## 2021-01-16 LAB — FOLATE: Folate: 10.9 ng/mL (ref >5.9)

## 2021-01-16 MED ORDER — SODIUM CHLORIDE 0.9% IV SOLUTION: Freq: Once | INTRAVENOUS | Status: DC

## 2021-01-16 MED ORDER — CALCIUM ACETATE (PHOS BINDER) 667 MG PO CAPS
2001.0000 mg | ORAL_CAPSULE | Freq: Three times a day (TID) | ORAL | Status: DC
  Administered 2021-01-16 – 2021-01-17 (×2): 2001 mg via ORAL
  Filled 2021-01-16 (×2): qty 3

## 2021-01-16 MED ORDER — GABAPENTIN 100 MG PO CAPS
100.0000 mg | ORAL_CAPSULE | Freq: Three times a day (TID) | ORAL | Status: DC
  Administered 2021-01-16 – 2021-01-17 (×3): 100 mg via ORAL
  Filled 2021-01-16 (×3): qty 1

## 2021-01-16 MED ORDER — ONDANSETRON HCL 4 MG/2ML IJ SOLN: 4.0000 mg | Freq: Four times a day (QID) | INTRAMUSCULAR | Status: DC | PRN

## 2021-01-16 MED ORDER — PRAVASTATIN SODIUM 40 MG PO TABS
40.0000 mg | ORAL_TABLET | Freq: Every day | ORAL | Status: DC
Start: 2021-01-16 — End: 2021-01-17
  Administered 2021-01-16 – 2021-01-17 (×2): 40 mg via ORAL
  Filled 2021-01-16 (×2): qty 1

## 2021-01-16 MED ORDER — AMITRIPTYLINE HCL 10 MG PO TABS
10.0000 mg | ORAL_TABLET | Freq: Every day | ORAL | Status: DC
  Administered 2021-01-16: 10 mg via ORAL
  Filled 2021-01-16: qty 1

## 2021-01-16 MED ORDER — PREDNISONE 5 MG PO TABS
5.0000 mg | ORAL_TABLET | Freq: Every day | ORAL | Status: DC
  Administered 2021-01-17: 5 mg via ORAL
  Filled 2021-01-16: qty 1

## 2021-01-16 MED ORDER — DILTIAZEM HCL ER COATED BEADS 180 MG PO CP24
360.0000 mg | ORAL_CAPSULE | Freq: Every day | ORAL | Status: DC
  Administered 2021-01-16 – 2021-01-17 (×2): 360 mg via ORAL
  Filled 2021-01-16 (×2): qty 2

## 2021-01-16 MED ORDER — DICYCLOMINE HCL 10 MG PO CAPS
10.0000 mg | ORAL_CAPSULE | Freq: Three times a day (TID) | ORAL | Status: DC
  Administered 2021-01-16 – 2021-01-17 (×2): 10 mg via ORAL
  Filled 2021-01-16 (×3): qty 1

## 2021-01-16 MED ORDER — HYOSCYAMINE SULFATE 0.125 MG PO TABS
0.1250 mg | ORAL_TABLET | Freq: Four times a day (QID) | ORAL | Status: DC | PRN
  Filled 2021-01-16: qty 1

## 2021-01-16 MED ORDER — MELATONIN 3 MG PO TABS: 3.0000 mg | ORAL_TABLET | Freq: Every evening | ORAL | Status: DC | PRN

## 2021-01-16 MED ORDER — POTASSIUM CHLORIDE CRYS ER 20 MEQ PO TBCR
40.0000 meq | EXTENDED_RELEASE_TABLET | Freq: Two times a day (BID) | ORAL | Status: AC
  Administered 2021-01-16 (×2): 40 meq via ORAL
  Filled 2021-01-16 (×2): qty 2

## 2021-01-16 MED ORDER — PANTOPRAZOLE SODIUM 40 MG PO TBEC
40.0000 mg | DELAYED_RELEASE_TABLET | Freq: Every day | ORAL | Status: DC
  Administered 2021-01-16 – 2021-01-17 (×2): 40 mg via ORAL
  Filled 2021-01-16 (×2): qty 1

## 2021-01-16 MED ORDER — TACROLIMUS 0.5 MG PO CAPS
1.0000 mg | ORAL_CAPSULE | Freq: Two times a day (BID) | ORAL | Status: DC
  Administered 2021-01-16 – 2021-01-17 (×2): 1 mg via ORAL
  Filled 2021-01-16 (×2): qty 2

## 2021-01-16 MED ORDER — SIROLIMUS 1 MG PO TABS
1.0000 mg | ORAL_TABLET | Freq: Every day | ORAL | Status: DC
  Filled 2021-01-16 (×5): qty 1

## 2021-01-16 MED ORDER — CYCLOBENZAPRINE HCL 10 MG PO TABS
10.0000 mg | ORAL_TABLET | Freq: Two times a day (BID) | ORAL | Status: DC
  Administered 2021-01-16 – 2021-01-17 (×3): 10 mg via ORAL
  Filled 2021-01-16 (×3): qty 1

## 2021-01-16 MED ORDER — MYCOPHENOLATE MOFETIL 250 MG PO CAPS
250.0000 mg | ORAL_CAPSULE | Freq: Two times a day (BID) | ORAL | Status: DC
  Administered 2021-01-16 – 2021-01-17 (×2): 250 mg via ORAL
  Filled 2021-01-16 (×2): qty 1

## 2021-01-16 MED ORDER — COLESTIPOL HCL 1 G PO TABS
1.0000 g | ORAL_TABLET | Freq: Every day | ORAL | Status: DC
  Administered 2021-01-16 – 2021-01-17 (×2): 1 g via ORAL
  Filled 2021-01-16 (×3): qty 1

## 2021-01-16 MED ORDER — ACETAMINOPHEN 325 MG PO TABS
650.0000 mg | ORAL_TABLET | Freq: Four times a day (QID) | ORAL | Status: DC | PRN
  Administered 2021-01-16: 650 mg via ORAL
  Filled 2021-01-16: qty 2

## 2021-01-16 MED ORDER — MAGNESIUM OXIDE 400 (241.3 MG) MG PO TABS
400.0000 mg | ORAL_TABLET | Freq: Every day | ORAL | Status: DC
  Administered 2021-01-16 – 2021-01-17 (×2): 400 mg via ORAL
  Filled 2021-01-16 (×2): qty 1

## 2021-01-16 MED ORDER — LABETALOL HCL 5 MG/ML IV SOLN: 10.0000 mg | INTRAVENOUS | Status: DC | PRN

## 2021-01-16 MED ORDER — CARVEDILOL 3.125 MG PO TABS
6.2500 mg | ORAL_TABLET | Freq: Two times a day (BID) | ORAL | Status: DC
Start: 2021-01-16 — End: 2021-01-17
  Administered 2021-01-16 – 2021-01-17 (×3): 6.25 mg via ORAL
  Filled 2021-01-16 (×3): qty 2

## 2021-01-16 MED ORDER — TRAMADOL HCL 50 MG PO TABS: 50.0000 mg | ORAL_TABLET | Freq: Four times a day (QID) | ORAL | Status: DC | PRN

## 2021-01-16 MED ORDER — FOLIC ACID 1 MG PO TABS
1.0000 mg | ORAL_TABLET | Freq: Every day | ORAL | Status: DC
  Administered 2021-01-16 – 2021-01-17 (×2): 1 mg via ORAL
  Filled 2021-01-16 (×2): qty 1

## 2021-01-16 MED ORDER — POLYSACCHARIDE IRON COMPLEX 150 MG PO CAPS
150.0000 mg | ORAL_CAPSULE | Freq: Every day | ORAL | Status: DC
Start: 2021-01-16 — End: 2021-01-17
  Administered 2021-01-16 – 2021-01-17 (×2): 150 mg via ORAL
  Filled 2021-01-16 (×2): qty 1

## 2021-01-16 MED ORDER — HYDRALAZINE HCL 25 MG PO TABS
50.0000 mg | ORAL_TABLET | Freq: Three times a day (TID) | ORAL | Status: DC
  Administered 2021-01-16 (×2): 50 mg via ORAL
  Filled 2021-01-16 (×3): qty 2

## 2021-01-16 MED ORDER — ONDANSETRON HCL 4 MG PO TABS: 4.0000 mg | ORAL_TABLET | Freq: Four times a day (QID) | ORAL | Status: DC | PRN

## 2021-01-16 MED ORDER — RENA-VITE PO TABS
1.0000 | ORAL_TABLET | Freq: Every day | ORAL | Status: DC
  Administered 2021-01-16 – 2021-01-17 (×2): 1 via ORAL
  Filled 2021-01-16 (×2): qty 1

## 2021-01-16 MED ORDER — ALLOPURINOL 100 MG PO TABS
100.0000 mg | ORAL_TABLET | Freq: Every day | ORAL | Status: DC
  Administered 2021-01-16 – 2021-01-17 (×2): 100 mg via ORAL
  Filled 2021-01-16 (×2): qty 1

## 2021-01-16 MED ORDER — ACETAMINOPHEN 650 MG RE SUPP: 650.0000 mg | Freq: Four times a day (QID) | RECTAL | Status: DC | PRN

## 2021-01-16 NOTE — ED Provider Notes (Signed)
[place] Provider Note   CSN: [id number] Arrival date & time: 01/16/21  1127     History No chief complaint on file.   Antonio Powell is a 45 y.o. male.  Pt sent here from dialysis center due anemia and blood transfusion.  Pt reports he is anemic due to renal disease.   The history is provided by the patient. No language interpreter was used.  Weakness Onset quality:  Gradual Timing:  Constant Progression:  Worsening Chronicity:  New Relieved by:  Nothing Worsened by:  Nothing Ineffective treatments:  None tried      Past Medical History:  Diagnosis Date  . Heart transplant recipient [facility])   . Hypertension     There are no problems to display for this patient.   Past Surgical History:  Procedure Laterality Date  . APPENDECTOMY    . CARDIAC SURGERY    . CHOLECYSTECTOMY    . HERNIA REPAIR    . left ear         No family history on file.  Social History   Tobacco Use  . Smoking status: Current Every Day Smoker    Packs/day: 0.50    Types: Cigarettes  . Smokeless tobacco: Never Used  Substance Use Topics  . Alcohol use: Yes    Comment: occ  . Drug use: Yes    Types: Marijuana    Home Medications Prior to Admission medications   Medication Sig Start Date End Date Taking? Authorizing Provider  allopurinol (ZYLOPRIM) 100 MG tablet Take 100 mg by mouth daily.    [provider]  aspirin 81 MG chewable tablet Chew 81 mg by mouth daily.    [provider]  carvedilol (COREG) 6.25 MG tablet Take 6.25 mg by mouth 2 (two) times daily.     [provider]  diltiazem (TIAZAC) 360 MG 24 hr capsule Take 360 mg by mouth daily.    [provider]  folic acid (FOLVITE) 1 MG tablet Take 1 mg by mouth daily.    [provider]  furosemide (LASIX) 20 MG tablet Take 20 mg by mouth daily.    [provider]  hydrALAZINE (APRESOLINE) 50 MG tablet Take 50 mg by mouth 3 (three) times daily.     [provider]  iron polysaccharides (NIFEREX) 150 MG capsule Take 150 mg by mouth daily.    [provider]  linaclotide (LINZESS) 145 MCG CAPS capsule Take 145 mcg by mouth daily before breakfast.    [provider]  magnesium oxide (MAG-OX) 400 MG tablet Take 400 mg by mouth daily.    [provider]  Multiple Vitamins-Minerals (MULTIVITAMIN ADULT) TABS Take 1 tablet by mouth daily.    [provider]  pravastatin (PRAVACHOL) 40 MG tablet Take 40 mg by mouth daily.    [provider]  ranitidine (ZANTAC) 150 MG tablet Take 150 mg by mouth 2 (two) times daily.    [provider]  sirolimus (RAPAMUNE) 1 MG tablet Take 1 mg by mouth daily.    [provider]  tacrolimus (PROGRAF) 1 MG capsule Take 1 mg by mouth 2 (two) times daily.    [provider]  traMADol (ULTRAM) 50 MG tablet Take 1 tablet (50 mg total) by mouth every 6 (six) hours as needed. Patient not taking: No sig reported 09/04/17   Triplett, Tammy, PA-C    Allergies    Nsaids, Tolmetin, Varenicline, and Phenergan [promethazine hcl]  Review of Systems  Review of Systems  Neurological: Positive for weakness.  All other systems reviewed and are negative.   Physical Exam Updated Vital Signs BP (!) 154/92   Pulse (!) 116   Temp 98.7 F (37.1 C) (Oral)   Resp 18   Ht 5\' 11"  (1.803 m)   Wt 63.5 kg   SpO2 100%   BMI 19.53 kg/m   Physical Exam Vitals and nursing note reviewed.  Constitutional:      Appearance: He is well-developed.  HENT:     Head: Normocephalic and atraumatic.  Eyes:     Comments: Pale conjunctiva  Cardiovascular:     Rate and Rhythm: Normal rate.     Heart sounds: Murmur heard.    Pulmonary:     Effort: Pulmonary effort is normal. No respiratory distress.     Breath sounds: Normal breath sounds.  Abdominal:     Palpations: Abdomen is soft.     Tenderness: There is no abdominal tenderness.  Musculoskeletal:      Cervical back: Neck supple.  Skin:    General: Skin is warm.  Neurological:     Mental Status: He is alert.  Psychiatric:        Mood and Affect: Mood normal.     ED Results / Procedures / Treatments   Labs (all labs ordered are listed, but only abnormal results are displayed) Labs Reviewed  CBC WITH DIFFERENTIAL/PLATELET - Abnormal; Notable for the following components:      Result Value   RBC 2.22 (*)    Hemoglobin 6.8 (*)    HCT 20.4 (*)    RDW 15.6 (*)    All other components within normal limits  BASIC METABOLIC PANEL - Abnormal; Notable for the following components:   Potassium 2.8 (*)    Chloride 97 (*)    Glucose, Bld 129 (*)    BUN 24 (*)    Creatinine, Ser 6.18 (*)    Calcium 8.0 (*)    GFR, Estimated 11 (*)    Anion gap 16 (*)    All other components within normal limits  TYPE AND SCREEN  PREPARE RBC (CROSSMATCH)  ABO/RH    EKG None  Radiology No results found.  Procedures .Critical Care Performed by: Fransico Meadow, PA-C Authorized by: Fransico Meadow, PA-C   Critical care provider statement:    Critical care time (minutes):  45   Critical care start time:  01/16/2021 11:30 AM   Critical care end time:  01/16/2021 2:38 PM   Critical care time was exclusive of:  Separately billable procedures and treating other patients   Critical care was necessary to treat or prevent imminent or life-threatening deterioration of the following conditions:  Circulatory failure   Critical care was time spent personally by me on the following activities:  Interpretation of cardiac output measurements, obtaining history from patient or surrogate, review of old charts, re-evaluation of patient's condition, ordering and review of laboratory studies and development of treatment plan with patient or surrogate   I assumed direction of critical care for this patient from another provider in my specialty: no     Care discussed with: admitting provider       Medications  Ordered in ED Medications  0.9 %  sodium chloride infusion (Manually program via Guardrails IV Fluids) (has no administration in time range)    ED Course  I have reviewed the triage vital signs and the nursing notes.  Pertinent labs & imaging results that were available  during my care of the patient were reviewed by me and considered in my medical decision making (see chart for details).    MDM Rules/Calculators/A&P                          MDM:  Hemoglobin is 6.8.  Blood administration ordered,  Hospitalist consulted  Final Clinical Impression(s) / ED Diagnoses Final diagnoses:  Anemia due to chronic kidney disease, on chronic dialysis [facility])    Rx / DC Orders ED Discharge Orders    None       Sidney Ace 01/16/21 1439    Fredia Sorrow, MD 01/27/21 [phone]

## 2021-01-16 NOTE — ED Triage Notes (Signed)
Referral from dialysis for low hemoglobin treatment

## 2021-01-16 NOTE — H&P (Signed)
History and Physical    Antonio Powell CBJ:[id number] DOB: Dec 15, 1975 DOA: 01/16/2021  PCP: [facility]   Patient coming from: Home  Chief Complaint: Worsening hemoglobin level  HPI: Antonio Powell is a 45 y.o. male with medical history significant for prior heart transplant, hypertension, gout, dyslipidemia, and ESRD on HD TTS who presented to the ED after he was told that his hemoglobin levels were low and that he required transfusion.  He had his last hemodialysis session on 3/10 with lab work performed at that time.  He is currently otherwise asymptomatic and denies any fatigue, chest pain, shortness of breath, or abdominal pain.  He denies any dark stools or frank blood noted in his stools.  No lightheadedness or dizziness noted.   ED Course: Stable vital signs noted.  Hemoglobin 6.8 and potassium 2.8.  Creatinine 6.18.  Review of Systems: Reviewed as noted above, otherwise negative.  Past Medical History:  Diagnosis Date  . Heart transplant recipient [facility])   . Hypertension     Past Surgical History:  Procedure Laterality Date  . APPENDECTOMY    . CARDIAC SURGERY    . CHOLECYSTECTOMY    . HERNIA REPAIR    . left ear       reports that he has been smoking cigarettes. He has been smoking about 0.50 packs per day. He has never used smokeless tobacco. He reports current alcohol use. He reports current drug use. Drug: Marijuana.  Allergies  Allergen Reactions  . Nsaids     Cant take due to heart transplant  . Tolmetin Other (See Comments)    Cant take due to heart transplant  . Varenicline Other (See Comments)    Vivid dreams  . Phenergan [Promethazine Hcl] Anxiety    No family history on file.  Prior to Admission medications   Medication Sig Start Date End Date Taking? Authorizing Provider  allopurinol (ZYLOPRIM) 100 MG tablet Take 100 mg by mouth daily.   Yes [provider]  aspirin 81 MG chewable tablet Chew 81 mg by mouth daily.   Yes  [provider]  carvedilol (COREG) 6.25 MG tablet Take 6.25 mg by mouth 2 (two) times daily.    Yes [provider]  diltiazem (TIAZAC) 360 MG 24 hr capsule Take 360 mg by mouth daily.   Yes [provider]  folic acid (FOLVITE) 1 MG tablet Take 1 mg by mouth daily.   Yes [provider]  hydrALAZINE (APRESOLINE) 50 MG tablet Take 50 mg by mouth 3 (three) times daily.   Yes [provider]  iron polysaccharides (NIFEREX) 150 MG capsule Take 150 mg by mouth daily.   Yes [provider]  magnesium oxide (MAG-OX) 400 MG tablet Take 400 mg by mouth daily.   Yes [provider]  Multiple Vitamins-Minerals (MULTIVITAMIN ADULT) TABS Take 1 tablet by mouth daily.   Yes [provider]  pravastatin (PRAVACHOL) 40 MG tablet Take 40 mg by mouth daily.   Yes [provider]  ranitidine (ZANTAC) 150 MG tablet Take 150 mg by mouth 2 (two) times daily.   Yes [provider]  sirolimus (RAPAMUNE) 1 MG tablet Take 1 mg by mouth daily.   Yes [provider]  tacrolimus (PROGRAF) 1 MG capsule Take 1 mg by mouth 2 (two) times daily.   Yes [provider]  furosemide (LASIX) 20 MG tablet Take 20 mg by mouth daily. Patient not taking: Reported on 01/16/2021    [provider]  linaclotide (LINZESS) 145 MCG CAPS capsule Take 145 mcg by mouth daily before breakfast. Patient not taking: Reported on 01/16/2021    [provider]  traMADol (ULTRAM) 50 MG tablet Take 1 tablet (50 mg total) by mouth every 6 (six) hours as needed. Patient not taking: No sig reported 09/04/17   Kem Parkinson, PA-C    Physical Exam: Vitals:   01/16/21 1140 01/16/21 1140 01/16/21 1428  BP: (!) 154/92  134/85  Pulse: (!) 116  (!) 104  Resp: 18  20  Temp: 98.7 F (37.1 C)    TempSrc: Oral    SpO2: 100%  100%  Weight:  63.5 kg   Height:  5\' 11"  (1.803 m)     Constitutional: NAD, calm, comfortable Vitals:    01/16/21 1140 01/16/21 1140 01/16/21 1428  BP: (!) 154/92  134/85  Pulse: (!) 116  (!) 104  Resp: 18  20  Temp: 98.7 F (37.1 C)    TempSrc: Oral    SpO2: 100%  100%  Weight:  63.5 kg   Height:  5\' 11"  (1.803 m)    Eyes: lids and conjunctivae normal Neck: normal, supple Respiratory: clear to auscultation bilaterally. Normal respiratory effort. No accessory muscle use.  Cardiovascular: Regular rate and rhythm, no murmurs. Abdomen: no tenderness, no distention. Bowel sounds positive.  Musculoskeletal:  No edema. Skin: no rashes, lesions, ulcers.  Psychiatric: Flat affect  Labs on Admission: I have personally reviewed following labs and imaging studies  CBC: Recent Labs  Lab 01/16/21 1318  WBC 8.9  NEUTROABS 5.9  HGB 6.8*  HCT 20.4*  MCV 91.9  PLT 657   Basic Metabolic Panel: Recent Labs  Lab 01/16/21 1318  NA 140  K 2.8*  CL 97*  CO2 27  GLUCOSE 129*  BUN 24*  CREATININE 6.18*  CALCIUM 8.0*   GFR: Estimated Creatinine Clearance: 14 mL/min (A) (by C-G formula based on SCr of 6.18 mg/dL (H)). Liver Function Tests: No results for input(s): AST, ALT, ALKPHOS, BILITOT, PROT, ALBUMIN in the last 168 hours. No results for input(s): LIPASE, AMYLASE in the last 168 hours. No results for input(s): AMMONIA in the last 168 hours. Coagulation Profile: No results for input(s): INR, PROTIME in the last 168 hours. Cardiac Enzymes: No results for input(s): CKTOTAL, CKMB, CKMBINDEX, TROPONINI in the last 168 hours. BNP (last 3 results) No results for input(s): PROBNP in the last 8760 hours. HbA1C: No results for input(s): HGBA1C in the last 72 hours. CBG: No results for input(s): GLUCAP in the last 168 hours. Lipid Profile: No results for input(s): CHOL, HDL, LDLCALC, TRIG, CHOLHDL, LDLDIRECT in the last 72 hours. Thyroid Function Tests: No results for input(s): TSH, T4TOTAL, FREET4, T3FREE, THYROIDAB in the last 72 hours. Anemia Panel: No results for input(s):  VITAMINB12, FOLATE, FERRITIN, TIBC, IRON, RETICCTPCT in the last 72 hours. Urine analysis:    Component Value Date/Time   COLORURINE YELLOW 01/02/2008 1216   APPEARANCEUR CLEAR 01/02/2008 1216   LABSPEC 1.020 01/02/2008 1216   PHURINE 5.0 01/02/2008 1216   GLUCOSEU NEGATIVE 01/02/2008 1216   HGBUR LARGE (A) 01/02/2008 1216   BILIRUBINUR NEGATIVE 01/02/2008 1216   KETONESUR NEGATIVE 01/02/2008 1216   PROTEINUR NEGATIVE 01/02/2008 1216   UROBILINOGEN 0.2 01/02/2008 1216   NITRITE NEGATIVE 01/02/2008 1216   LEUKOCYTESUR SMALL (A) 01/02/2008 1216    Radiological Exams on Admission: No results found.   Assessment/Plan Active Problems:   Acute anemia    Acute asymptomatic anemia secondary  to ESRD -Plan to transfuse 2 unit PRBC -Anticipate further hemodialysis by tomorrow which can occur after discharge -Check stool occult on anemia panel for completeness -Avoid heparin agents and hold aspirin  Hypokalemia -Plan to give potassium, 2 doses -Recheck in a.m.  ESRD on HD TTS -Last hemodialysis 3/10 -Usual chair time around 11 AM and will have another dialysis on discharge tomorrow -No significant volume overload or other abnormalities noted  History of prior heart transplant -Continue home medications  History of hypertension-controlled -Continue home medications  History of gout -Continue allopurinol  History of dyslipidemia -Continue statin   DVT prophylaxis: SCDs Code Status: Full Family Communication: None at bedside Disposition Plan:Admit for PRBC transfusion Consults called:None Admission status: Obs, MedSurg  Rhema Boyett D Ladawna Walgren DO [facility]  If 7PM-7AM, please contact night-coverage [email or web address]  01/16/2021, 3:20 PM

## 2021-01-16 NOTE — ED Notes (Signed)
Date and time results received: 01/16/21 1403  Test: Hgb Critical Value: 6.8  Name of Provider Notified: K Sophia  Orders Received? Or Actions Taken?: N/A

## 2021-01-17 DIAGNOSIS — N186 End stage renal disease: Secondary | ICD-10-CM | POA: Diagnosis not present

## 2021-01-17 DIAGNOSIS — D649 Anemia, unspecified: Secondary | ICD-10-CM | POA: Diagnosis not present

## 2021-01-17 LAB — TYPE AND SCREEN
ABO/RH(D): A POS
Antibody Screen: NEGATIVE
Unit division: 0
Unit division: 0

## 2021-01-17 LAB — BPAM RBC
Blood Product Expiration Date: 202204052359
Blood Product Expiration Date: 202204062359
ISSUE DATE / TIME: 202203111552
ISSUE DATE / TIME: 202203111841
Unit Type and Rh: 6200
Unit Type and Rh: 6200

## 2021-01-17 LAB — BASIC METABOLIC PANEL
Anion gap: 13 (ref 5–15)
BUN: 32 mg/dL — ABNORMAL HIGH (ref 6–20)
CO2: 22 mmol/L (ref 22–32)
Calcium: 8.3 mg/dL — ABNORMAL LOW (ref 8.9–10.3)
Chloride: 103 mmol/L (ref 98–111)
Creatinine, Ser: 7.4 mg/dL — ABNORMAL HIGH (ref 0.61–1.24)
GFR, Estimated: 9 mL/min — ABNORMAL LOW (ref >60)
Glucose, Bld: 91 mg/dL (ref 70–99)
Potassium: 4.4 mmol/L (ref 3.5–5.1)
Sodium: 138 mmol/L (ref 135–145)

## 2021-01-17 LAB — CBC
HCT: 24.3 % — ABNORMAL LOW (ref 39.0–52.0)
Hemoglobin: 8 g/dL — ABNORMAL LOW (ref 13.0–17.0)
MCH: 30 pg (ref 26.0–34.0)
MCHC: 32.9 g/dL (ref 30.0–36.0)
MCV: 91 fL (ref 80.0–100.0)
Platelets: 167 10*3/uL (ref 150–400)
RBC: 2.67 MIL/uL — ABNORMAL LOW (ref 4.22–5.81)
RDW: 15.3 % (ref 11.5–15.5)
WBC: 7.8 10*3/uL (ref 4.0–10.5)
nRBC: 0 % (ref 0.0–0.2)

## 2021-01-17 LAB — MAGNESIUM: Magnesium: 1.6 mg/dL — ABNORMAL LOW (ref 1.7–2.4)

## 2021-01-17 LAB — SARS CORONAVIRUS 2 (TAT 6-24 HRS): SARS Coronavirus 2: NEGATIVE

## 2021-01-17 NOTE — Discharge Summary (Signed)
Physician Discharge Summary  Antonio Powell OQH:[id number] DOB: Jan 01, 1976 DOA: 01/16/2021  PCP: [facility]  Admit date: 01/16/2021  Discharge date: 01/17/2021  Admitted From:Home  Disposition:  Home  Recommendations for Outpatient Follow-up:  1. Follow up with PCP in 1-2 weeks 2. Continue usual hemodialysis sessions on TTS with next session at 11 AM on 3/12 3. Continue to follow-up CBC 4. Continue home medications as prior  Home Health: None  Equipment/Devices: None  Discharge Condition:Stable  CODE STATUS: Full  Diet recommendation: Renal diet  Brief/Interim Summary: Antonio Powell is a 45 y.o. male with medical history significant for prior heart transplant, hypertension, gout, dyslipidemia, and ESRD on HD TTS who presented to the ED after he was told that his hemoglobin levels were low and that he required transfusion.  He had his last hemodialysis session on 3/10 with lab work performed at that time.  He was noted to have a hemoglobin of 6.8 on admission and was otherwise asymptomatic with no overt bleeding noted.  He received 2 units of PRBCs with hemoglobin that has remained stable at 8 with no acute overnight events noted.  He is stable for discharge today and well have further hemodialysis at his usual location at 11 AM today.  Discharge Diagnoses:  Active Problems:   Acute anemia  Principal discharge diagnosis: Worsening anemia in the setting of ESRD.  Discharge Instructions  Discharge Instructions    Diet - low sodium heart healthy   Complete by: As directed    Increase activity slowly   Complete by: As directed      Allergies as of 01/17/2021      Reactions   Nsaids    Cant take due to heart transplant   Tolmetin Other (See Comments)   Cant take due to heart transplant   Varenicline Other (See Comments)   Vivid dreams   Phenergan [promethazine Hcl] Anxiety      Medication List    TAKE these medications   acetaminophen 500 MG  tablet Commonly known as: TYLENOL Take 500 mg by mouth every 4 (four) hours as needed for moderate pain.   allopurinol 100 MG tablet Commonly known as: ZYLOPRIM Take 100 mg by mouth daily.   amitriptyline 10 MG tablet Commonly known as: ELAVIL Take 10 mg by mouth at bedtime.   aspirin 81 MG chewable tablet Chew 81 mg by mouth daily.   calcium acetate 667 MG capsule Commonly known as: PHOSLO Take 2,001 mg by mouth 3 (three) times daily with meals. 2001 mg with meals and 1334 with snacks   carvedilol 6.25 MG tablet Commonly known as: COREG Take 6.25 mg by mouth 2 (two) times daily.   colestipol 1 g tablet Commonly known as: COLESTID Take 1 tablet by mouth daily.   cyclobenzaprine 10 MG tablet Commonly known as: FLEXERIL Take 10 mg by mouth 2 (two) times daily.   dicyclomine 10 MG capsule Commonly known as: BENTYL Take by mouth.   diltiazem 360 MG 24 hr capsule Commonly known as: TIAZAC Take 360 mg by mouth daily.   diltiazem 360 MG 24 hr capsule Commonly known as: CARDIZEM CD Take 360 mg by mouth daily.   doxycycline 100 MG tablet Commonly known as: VIBRA-TABS Take 100 mg by mouth daily.   folic acid 1 MG tablet Commonly known as: FOLVITE Take 1 mg by mouth daily.   furosemide 20 MG tablet Commonly known as: LASIX Take 20 mg by mouth daily.   gabapentin 100 MG capsule  Commonly known as: NEURONTIN Take 100 mg by mouth 3 (three) times daily.   hydrALAZINE 50 MG tablet Commonly known as: APRESOLINE Take 50 mg by mouth 3 (three) times daily.   hyoscyamine 0.125 MG Tbdp disintergrating tablet Commonly known as: ANASPAZ Place 0.125 mg under the tongue every 6 (six) hours as needed for cramping.   iron polysaccharides 150 MG capsule Commonly known as: NIFEREX Take 150 mg by mouth daily.   linaclotide 145 MCG Caps capsule Commonly known as: LINZESS Take 145 mcg by mouth daily before breakfast.   loperamide 2 MG capsule Commonly known as: IMODIUM Take  2-4 mg by mouth every 6 (six) hours as needed.   magnesium oxide 400 MG tablet Commonly known as: MAG-OX Take 400 mg by mouth daily.   melatonin 3 MG Tabs tablet TAKE 2 TABLETS BY MOUTH NIGHTLY AS NEEDED SLEEP.   Multivitamin Adult Tabs Take 1 tablet by mouth daily.   multivitamin Tabs tablet Take 1 tablet by mouth daily.   mycophenolate 250 MG capsule Commonly known as: CELLCEPT TAKE 2 CAPSULES BY MOUTH 2 TIMES DAILY.   ondansetron 4 MG tablet Commonly known as: ZOFRAN Take by mouth.   pantoprazole 20 MG tablet Commonly known as: PROTONIX Take 20 mg by mouth daily.   pravastatin 40 MG tablet Commonly known as: PRAVACHOL Take 40 mg by mouth daily.   predniSONE 5 MG tablet Commonly known as: DELTASONE   ranitidine 150 MG tablet Commonly known as: ZANTAC Take 150 mg by mouth 2 (two) times daily.   sirolimus 1 MG tablet Commonly known as: RAPAMUNE Take 1 mg by mouth daily.   tacrolimus 1 MG capsule Commonly known as: PROGRAF Take 1 mg by mouth 2 (two) times daily.   traMADol 50 MG tablet Commonly known as: ULTRAM Take 1 tablet (50 mg total) by mouth every 6 (six) hours as needed.       Follow-up Information    [facility]. Schedule an appointment as soon as possible for a visit in 2 week(s).   Specialty: Internal Medicine Contact information: [place] [address] [phone]              Allergies  Allergen Reactions  . Nsaids     Cant take due to heart transplant  . Tolmetin Other (See Comments)    Cant take due to heart transplant  . Varenicline Other (See Comments)    Vivid dreams  . Phenergan [Promethazine Hcl] Anxiety    Consultations:  None   Procedures/Studies:  No results found.   Discharge Exam: Vitals:   01/16/21 2153 01/17/21 0525  BP: (!) 149/100 (!) 130/94  Pulse: 99 92  Resp: 17 18  Temp: 98.7 F (37.1 C) 98.2 F (36.8 C)  SpO2: 100% 96%   Vitals:   01/16/21 2015 01/16/21 2128 01/16/21  2153 01/17/21 0525  BP: (!) 158/108 (!) 145/100 (!) 149/100 (!) 130/94  Pulse: (!) 103 (!) 101 99 92  Resp: 16 18 17 18   Temp: 99 F (37.2 C) 98.9 F (37.2 C) 98.7 F (37.1 C) 98.2 F (36.8 C)  TempSrc: Oral Oral Oral Oral  SpO2: 100% 98% 100% 96%  Weight:      Height:        General: Pt is alert, awake, not in acute distress Cardiovascular: RRR, S1/S2 +, no rubs, no gallops Respiratory: CTA bilaterally, no wheezing, no rhonchi Abdominal: Soft, NT, ND, bowel sounds + Extremities: no edema, no cyanosis  The results of significant diagnostics from this hospitalization (including imaging, microbiology, ancillary and laboratory) are listed below for reference.     Microbiology: Recent Results (from the past 240 hour(s))  SARS CORONAVIRUS 2 (TAT 6-24 HRS) Nasopharyngeal Nasopharyngeal Swab     Status: None   Collection Time: 01/16/21  3:59 PM   Specimen: Nasopharyngeal Swab  Result Value Ref Range Status   SARS Coronavirus 2 NEGATIVE NEGATIVE Final    Comment: (NOTE) SARS-CoV-2 target nucleic acids are NOT DETECTED.  The SARS-CoV-2 RNA is generally detectable in upper and lower respiratory specimens during the acute phase of infection. Negative results do not preclude SARS-CoV-2 infection, do not rule out co-infections with other pathogens, and should not be used as the sole basis for treatment or other patient management decisions. Negative results must be combined with clinical observations, patient history, and epidemiological information. The expected result is Negative.  Fact Sheet for Patients: SugarRoll.be  Fact Sheet for Healthcare Providers: https://www.woods-mathews.com/  This test is not yet approved or cleared by the [place] FDA and  has been authorized for detection and/or diagnosis of SARS-CoV-2 by FDA under an Emergency Use Authorization (EUA). This EUA will remain  in effect (meaning this test can be  used) for the duration of the COVID-19 declaration under Se ction 564(b)(1) of the Act, 21 U.S.C. section 360bbb-3(b)(1), unless the authorization is terminated or revoked sooner.  Performed at [facility], [place] [address], [place], [place] [address]      Labs: BNP (last 3 results) No results for input(s): BNP in the last 8760 hours. Basic Metabolic Panel: Recent Labs  Lab 01/16/21 1318  NA 140  K 2.8*  CL 97*  CO2 27  GLUCOSE 129*  BUN 24*  CREATININE 6.18*  CALCIUM 8.0*   Liver Function Tests: No results for input(s): AST, ALT, ALKPHOS, BILITOT, PROT, ALBUMIN in the last 168 hours. No results for input(s): LIPASE, AMYLASE in the last 168 hours. No results for input(s): AMMONIA in the last 168 hours. CBC: Recent Labs  Lab 01/16/21 1318 01/16/21 2301 01/17/21 0503  WBC 8.9  --  7.8  NEUTROABS 5.9  --   --   HGB 6.8* 8.0* 8.0*  HCT 20.4* 23.6* 24.3*  MCV 91.9  --  91.0  PLT 230  --  167   Cardiac Enzymes: No results for input(s): CKTOTAL, CKMB, CKMBINDEX, TROPONINI in the last 168 hours. BNP: Invalid input(s): POCBNP CBG: No results for input(s): GLUCAP in the last 168 hours. D-Dimer No results for input(s): DDIMER in the last 72 hours. Hgb A1c No results for input(s): HGBA1C in the last 72 hours. Lipid Profile No results for input(s): CHOL, HDL, LDLCALC, TRIG, CHOLHDL, LDLDIRECT in the last 72 hours. Thyroid function studies No results for input(s): TSH, T4TOTAL, T3FREE, THYROIDAB in the last 72 hours.  Invalid input(s): FREET3 Anemia work up Recent Labs    01/16/21 1513 01/16/21 1514  VITAMINB12 266  --   FOLATE  --  10.9  FERRITIN 889*  --   TIBC 245*  --   IRON 45  --   RETICCTPCT  --  3.3*   Urinalysis    Component Value Date/Time   COLORURINE YELLOW 01/02/2008 1216   APPEARANCEUR CLEAR 01/02/2008 1216   LABSPEC 1.020 01/02/2008 1216   PHURINE 5.0 01/02/2008 1216   GLUCOSEU NEGATIVE 01/02/2008 1216   [place] (A) 01/02/2008  1216   [place] 01/02/2008 [place] 01/02/2008 1216  PROTEINUR NEGATIVE 01/02/2008 1216   UROBILINOGEN 0.2 01/02/2008 1216   NITRITE NEGATIVE 01/02/2008 1216   LEUKOCYTESUR SMALL (A) 01/02/2008 1216   Sepsis Labs Invalid input(s): PROCALCITONIN,  WBC,  LACTICIDVEN Microbiology Recent Results (from the past 240 hour(s))  SARS CORONAVIRUS 2 (TAT 6-24 HRS) Nasopharyngeal Nasopharyngeal Swab     Status: None   Collection Time: 01/16/21  3:59 PM   Specimen: Nasopharyngeal Swab  Result Value Ref Range Status   SARS Coronavirus 2 NEGATIVE NEGATIVE Final    Comment: (NOTE) SARS-CoV-2 target nucleic acids are NOT DETECTED.  The SARS-CoV-2 RNA is generally detectable in upper and lower respiratory specimens during the acute phase of infection. Negative results do not preclude SARS-CoV-2 infection, do not rule out co-infections with other pathogens, and should not be used as the sole basis for treatment or other patient management decisions. Negative results must be combined with clinical observations, patient history, and epidemiological information. The expected result is Negative.  Fact Sheet for Patients: SugarRoll.be  Fact Sheet for Healthcare Providers: https://www.woods-mathews.com/  This test is not yet approved or cleared by the [place] FDA and  has been authorized for detection and/or diagnosis of SARS-CoV-2 by FDA under an Emergency Use Authorization (EUA). This EUA will remain  in effect (meaning this test can be used) for the duration of the COVID-19 declaration under Se ction 564(b)(1) of the Act, 21 U.S.C. section 360bbb-3(b)(1), unless the authorization is terminated or revoked sooner.  Performed at [facility], [place] [address] [place], [place] [address]      Time coordinating discharge: 35 minutes  SIGNED:   Rodena Goldmann, DO [facility] 01/17/2021, 8:03 AM  If  7PM-7AM, please contact night-coverage [email or web address]

## 2021-01-19 DIAGNOSIS — M6281 Muscle weakness (generalized): Secondary | ICD-10-CM | POA: Diagnosis not present

## 2021-01-19 DIAGNOSIS — N186 End stage renal disease: Secondary | ICD-10-CM | POA: Diagnosis not present

## 2021-01-19 DIAGNOSIS — J9601 Acute respiratory failure with hypoxia: Secondary | ICD-10-CM | POA: Diagnosis not present

## 2021-01-19 DIAGNOSIS — E440 Moderate protein-calorie malnutrition: Secondary | ICD-10-CM | POA: Diagnosis not present

## 2021-01-19 DIAGNOSIS — Z992 Dependence on renal dialysis: Secondary | ICD-10-CM | POA: Diagnosis not present

## 2021-01-19 DIAGNOSIS — I120 Hypertensive chronic kidney disease with stage 5 chronic kidney disease or end stage renal disease: Secondary | ICD-10-CM | POA: Diagnosis not present

## 2021-01-19 DIAGNOSIS — L89892 Pressure ulcer of other site, stage 2: Secondary | ICD-10-CM | POA: Diagnosis not present

## 2021-01-19 DIAGNOSIS — L89612 Pressure ulcer of right heel, stage 2: Secondary | ICD-10-CM | POA: Diagnosis not present

## 2021-01-19 DIAGNOSIS — J1282 Pneumonia due to coronavirus disease 2019: Secondary | ICD-10-CM | POA: Diagnosis not present

## 2021-01-19 DIAGNOSIS — U071 COVID-19: Secondary | ICD-10-CM | POA: Diagnosis not present

## 2021-01-19 DIAGNOSIS — Z952 Presence of prosthetic heart valve: Secondary | ICD-10-CM | POA: Diagnosis not present

## 2021-01-19 DIAGNOSIS — I2510 Atherosclerotic heart disease of native coronary artery without angina pectoris: Secondary | ICD-10-CM | POA: Diagnosis not present

## 2021-01-19 DIAGNOSIS — Z8719 Personal history of other diseases of the digestive system: Secondary | ICD-10-CM | POA: Diagnosis not present

## 2021-01-19 DIAGNOSIS — D62 Acute posthemorrhagic anemia: Secondary | ICD-10-CM | POA: Diagnosis not present

## 2021-01-19 DIAGNOSIS — Z941 Heart transplant status: Secondary | ICD-10-CM | POA: Diagnosis not present

## 2021-01-20 DIAGNOSIS — Z5181 Encounter for therapeutic drug level monitoring: Secondary | ICD-10-CM | POA: Diagnosis not present

## 2021-01-20 DIAGNOSIS — N186 End stage renal disease: Secondary | ICD-10-CM | POA: Diagnosis not present

## 2021-01-20 DIAGNOSIS — Z79899 Other long term (current) drug therapy: Secondary | ICD-10-CM | POA: Diagnosis not present

## 2021-01-20 DIAGNOSIS — Z992 Dependence on renal dialysis: Secondary | ICD-10-CM | POA: Diagnosis not present

## 2021-01-22 DIAGNOSIS — N186 End stage renal disease: Secondary | ICD-10-CM | POA: Diagnosis not present

## 2021-01-22 DIAGNOSIS — Z992 Dependence on renal dialysis: Secondary | ICD-10-CM | POA: Diagnosis not present

## 2021-01-26 DIAGNOSIS — Z8719 Personal history of other diseases of the digestive system: Secondary | ICD-10-CM | POA: Diagnosis not present

## 2021-01-26 DIAGNOSIS — D62 Acute posthemorrhagic anemia: Secondary | ICD-10-CM | POA: Diagnosis not present

## 2021-01-26 DIAGNOSIS — I2510 Atherosclerotic heart disease of native coronary artery without angina pectoris: Secondary | ICD-10-CM | POA: Diagnosis not present

## 2021-01-26 DIAGNOSIS — Z992 Dependence on renal dialysis: Secondary | ICD-10-CM | POA: Diagnosis not present

## 2021-01-26 DIAGNOSIS — J1282 Pneumonia due to coronavirus disease 2019: Secondary | ICD-10-CM | POA: Diagnosis not present

## 2021-01-26 DIAGNOSIS — Z952 Presence of prosthetic heart valve: Secondary | ICD-10-CM | POA: Diagnosis not present

## 2021-01-26 DIAGNOSIS — Z941 Heart transplant status: Secondary | ICD-10-CM | POA: Diagnosis not present

## 2021-01-26 DIAGNOSIS — I120 Hypertensive chronic kidney disease with stage 5 chronic kidney disease or end stage renal disease: Secondary | ICD-10-CM | POA: Diagnosis not present

## 2021-01-26 DIAGNOSIS — L89612 Pressure ulcer of right heel, stage 2: Secondary | ICD-10-CM | POA: Diagnosis not present

## 2021-01-26 DIAGNOSIS — U071 COVID-19: Secondary | ICD-10-CM | POA: Diagnosis not present

## 2021-01-26 DIAGNOSIS — M6281 Muscle weakness (generalized): Secondary | ICD-10-CM | POA: Diagnosis not present

## 2021-01-26 DIAGNOSIS — N186 End stage renal disease: Secondary | ICD-10-CM | POA: Diagnosis not present

## 2021-01-26 DIAGNOSIS — E440 Moderate protein-calorie malnutrition: Secondary | ICD-10-CM | POA: Diagnosis not present

## 2021-01-26 DIAGNOSIS — J9601 Acute respiratory failure with hypoxia: Secondary | ICD-10-CM | POA: Diagnosis not present

## 2021-01-26 DIAGNOSIS — L89892 Pressure ulcer of other site, stage 2: Secondary | ICD-10-CM | POA: Diagnosis not present

## 2021-01-27 DIAGNOSIS — N250 Renal osteodystrophy: Secondary | ICD-10-CM | POA: Diagnosis not present

## 2021-01-27 DIAGNOSIS — N186 End stage renal disease: Secondary | ICD-10-CM | POA: Diagnosis not present

## 2021-01-27 DIAGNOSIS — Z992 Dependence on renal dialysis: Secondary | ICD-10-CM | POA: Diagnosis not present

## 2021-01-29 DIAGNOSIS — Z992 Dependence on renal dialysis: Secondary | ICD-10-CM | POA: Diagnosis not present

## 2021-01-29 DIAGNOSIS — N186 End stage renal disease: Secondary | ICD-10-CM | POA: Diagnosis not present

## 2021-01-31 DIAGNOSIS — Z992 Dependence on renal dialysis: Secondary | ICD-10-CM | POA: Diagnosis not present

## 2021-01-31 DIAGNOSIS — N186 End stage renal disease: Secondary | ICD-10-CM | POA: Diagnosis not present

## 2021-02-03 DIAGNOSIS — I259 Chronic ischemic heart disease, unspecified: Secondary | ICD-10-CM | POA: Diagnosis not present

## 2021-02-03 DIAGNOSIS — D509 Iron deficiency anemia, unspecified: Secondary | ICD-10-CM | POA: Diagnosis not present

## 2021-02-03 DIAGNOSIS — Z992 Dependence on renal dialysis: Secondary | ICD-10-CM | POA: Diagnosis not present

## 2021-02-03 DIAGNOSIS — N186 End stage renal disease: Secondary | ICD-10-CM | POA: Diagnosis not present

## 2021-02-04 DIAGNOSIS — Z952 Presence of prosthetic heart valve: Secondary | ICD-10-CM | POA: Diagnosis not present

## 2021-02-04 DIAGNOSIS — Z941 Heart transplant status: Secondary | ICD-10-CM | POA: Diagnosis not present

## 2021-02-04 DIAGNOSIS — D62 Acute posthemorrhagic anemia: Secondary | ICD-10-CM | POA: Diagnosis not present

## 2021-02-04 DIAGNOSIS — J1282 Pneumonia due to coronavirus disease 2019: Secondary | ICD-10-CM | POA: Diagnosis not present

## 2021-02-04 DIAGNOSIS — L89892 Pressure ulcer of other site, stage 2: Secondary | ICD-10-CM | POA: Diagnosis not present

## 2021-02-04 DIAGNOSIS — N186 End stage renal disease: Secondary | ICD-10-CM | POA: Diagnosis not present

## 2021-02-04 DIAGNOSIS — M6281 Muscle weakness (generalized): Secondary | ICD-10-CM | POA: Diagnosis not present

## 2021-02-04 DIAGNOSIS — J9601 Acute respiratory failure with hypoxia: Secondary | ICD-10-CM | POA: Diagnosis not present

## 2021-02-04 DIAGNOSIS — I120 Hypertensive chronic kidney disease with stage 5 chronic kidney disease or end stage renal disease: Secondary | ICD-10-CM | POA: Diagnosis not present

## 2021-02-04 DIAGNOSIS — Z8719 Personal history of other diseases of the digestive system: Secondary | ICD-10-CM | POA: Diagnosis not present

## 2021-02-04 DIAGNOSIS — L89612 Pressure ulcer of right heel, stage 2: Secondary | ICD-10-CM | POA: Diagnosis not present

## 2021-02-04 DIAGNOSIS — I2510 Atherosclerotic heart disease of native coronary artery without angina pectoris: Secondary | ICD-10-CM | POA: Diagnosis not present

## 2021-02-04 DIAGNOSIS — Z992 Dependence on renal dialysis: Secondary | ICD-10-CM | POA: Diagnosis not present

## 2021-02-04 DIAGNOSIS — U071 COVID-19: Secondary | ICD-10-CM | POA: Diagnosis not present

## 2021-02-04 DIAGNOSIS — E440 Moderate protein-calorie malnutrition: Secondary | ICD-10-CM | POA: Diagnosis not present

## 2021-02-05 DIAGNOSIS — N186 End stage renal disease: Secondary | ICD-10-CM | POA: Diagnosis not present

## 2021-02-05 DIAGNOSIS — Z992 Dependence on renal dialysis: Secondary | ICD-10-CM | POA: Diagnosis not present

## 2021-02-10 DIAGNOSIS — Z992 Dependence on renal dialysis: Secondary | ICD-10-CM | POA: Diagnosis not present

## 2021-02-10 DIAGNOSIS — N250 Renal osteodystrophy: Secondary | ICD-10-CM | POA: Diagnosis not present

## 2021-02-10 DIAGNOSIS — N186 End stage renal disease: Secondary | ICD-10-CM | POA: Diagnosis not present

## 2021-02-11 DIAGNOSIS — M6281 Muscle weakness (generalized): Secondary | ICD-10-CM | POA: Diagnosis not present

## 2021-02-11 DIAGNOSIS — Z941 Heart transplant status: Secondary | ICD-10-CM | POA: Diagnosis not present

## 2021-02-11 DIAGNOSIS — N186 End stage renal disease: Secondary | ICD-10-CM | POA: Diagnosis not present

## 2021-02-11 DIAGNOSIS — U071 COVID-19: Secondary | ICD-10-CM | POA: Diagnosis not present

## 2021-02-11 DIAGNOSIS — E440 Moderate protein-calorie malnutrition: Secondary | ICD-10-CM | POA: Diagnosis not present

## 2021-02-11 DIAGNOSIS — J1282 Pneumonia due to coronavirus disease 2019: Secondary | ICD-10-CM | POA: Diagnosis not present

## 2021-02-11 DIAGNOSIS — Z8719 Personal history of other diseases of the digestive system: Secondary | ICD-10-CM | POA: Diagnosis not present

## 2021-02-11 DIAGNOSIS — I2510 Atherosclerotic heart disease of native coronary artery without angina pectoris: Secondary | ICD-10-CM | POA: Diagnosis not present

## 2021-02-11 DIAGNOSIS — Z992 Dependence on renal dialysis: Secondary | ICD-10-CM | POA: Diagnosis not present

## 2021-02-11 DIAGNOSIS — D62 Acute posthemorrhagic anemia: Secondary | ICD-10-CM | POA: Diagnosis not present

## 2021-02-11 DIAGNOSIS — L89612 Pressure ulcer of right heel, stage 2: Secondary | ICD-10-CM | POA: Diagnosis not present

## 2021-02-11 DIAGNOSIS — L89892 Pressure ulcer of other site, stage 2: Secondary | ICD-10-CM | POA: Diagnosis not present

## 2021-02-11 DIAGNOSIS — Z952 Presence of prosthetic heart valve: Secondary | ICD-10-CM | POA: Diagnosis not present

## 2021-02-11 DIAGNOSIS — I120 Hypertensive chronic kidney disease with stage 5 chronic kidney disease or end stage renal disease: Secondary | ICD-10-CM | POA: Diagnosis not present

## 2021-02-11 DIAGNOSIS — J9601 Acute respiratory failure with hypoxia: Secondary | ICD-10-CM | POA: Diagnosis not present

## 2021-02-12 DIAGNOSIS — K3189 Other diseases of stomach and duodenum: Secondary | ICD-10-CM | POA: Diagnosis not present

## 2021-02-14 DIAGNOSIS — N186 End stage renal disease: Secondary | ICD-10-CM | POA: Diagnosis not present

## 2021-02-14 DIAGNOSIS — Z992 Dependence on renal dialysis: Secondary | ICD-10-CM | POA: Diagnosis not present

## 2021-02-19 DIAGNOSIS — Z992 Dependence on renal dialysis: Secondary | ICD-10-CM | POA: Diagnosis not present

## 2021-02-19 DIAGNOSIS — N186 End stage renal disease: Secondary | ICD-10-CM | POA: Diagnosis not present

## 2021-02-20 ENCOUNTER — Encounter (HOSPITAL_COMMUNITY): Payer: Self-pay | Admitting: Emergency Medicine

## 2021-02-20 ENCOUNTER — Emergency Department (HOSPITAL_COMMUNITY): Payer: Medicare Other

## 2021-02-20 ENCOUNTER — Other Ambulatory Visit: Payer: Self-pay

## 2021-02-20 ENCOUNTER — Observation Stay (HOSPITAL_COMMUNITY)
Admission: EM | Admit: 2021-02-20 | Discharge: 2021-02-21 | Disposition: A | Discharge status: Home / Self Care | Payer: Medicare Other | Attending: Internal Medicine | Admitting: Internal Medicine

## 2021-02-20 DIAGNOSIS — N186 End stage renal disease: Secondary | ICD-10-CM

## 2021-02-20 DIAGNOSIS — R109 Unspecified abdominal pain: Secondary | ICD-10-CM | POA: Diagnosis not present

## 2021-02-20 DIAGNOSIS — K529 Noninfective gastroenteritis and colitis, unspecified: Principal | ICD-10-CM

## 2021-02-20 DIAGNOSIS — F17210 Nicotine dependence, cigarettes, uncomplicated: Secondary | ICD-10-CM | POA: Diagnosis not present

## 2021-02-20 DIAGNOSIS — Z7982 Long term (current) use of aspirin: Secondary | ICD-10-CM | POA: Diagnosis not present

## 2021-02-20 DIAGNOSIS — E440 Moderate protein-calorie malnutrition: Secondary | ICD-10-CM | POA: Diagnosis not present

## 2021-02-20 DIAGNOSIS — Z941 Heart transplant status: Secondary | ICD-10-CM | POA: Diagnosis not present

## 2021-02-20 DIAGNOSIS — Z79899 Other long term (current) drug therapy: Secondary | ICD-10-CM | POA: Diagnosis not present

## 2021-02-20 DIAGNOSIS — L89892 Pressure ulcer of other site, stage 2: Secondary | ICD-10-CM | POA: Diagnosis not present

## 2021-02-20 DIAGNOSIS — A09 Infectious gastroenteritis and colitis, unspecified: Secondary | ICD-10-CM | POA: Diagnosis present

## 2021-02-20 DIAGNOSIS — Z8719 Personal history of other diseases of the digestive system: Secondary | ICD-10-CM | POA: Diagnosis not present

## 2021-02-20 DIAGNOSIS — Z952 Presence of prosthetic heart valve: Secondary | ICD-10-CM

## 2021-02-20 DIAGNOSIS — U071 COVID-19: Secondary | ICD-10-CM | POA: Diagnosis not present

## 2021-02-20 DIAGNOSIS — Z992 Dependence on renal dialysis: Secondary | ICD-10-CM | POA: Diagnosis not present

## 2021-02-20 DIAGNOSIS — D62 Acute posthemorrhagic anemia: Secondary | ICD-10-CM | POA: Diagnosis not present

## 2021-02-20 DIAGNOSIS — I2510 Atherosclerotic heart disease of native coronary artery without angina pectoris: Secondary | ICD-10-CM | POA: Diagnosis not present

## 2021-02-20 DIAGNOSIS — J1282 Pneumonia due to coronavirus disease 2019: Secondary | ICD-10-CM | POA: Diagnosis not present

## 2021-02-20 DIAGNOSIS — I120 Hypertensive chronic kidney disease with stage 5 chronic kidney disease or end stage renal disease: Secondary | ICD-10-CM | POA: Diagnosis not present

## 2021-02-20 DIAGNOSIS — M6281 Muscle weakness (generalized): Secondary | ICD-10-CM | POA: Diagnosis not present

## 2021-02-20 DIAGNOSIS — Z20822 Contact with and (suspected) exposure to covid-19: Secondary | ICD-10-CM | POA: Diagnosis not present

## 2021-02-20 DIAGNOSIS — L89612 Pressure ulcer of right heel, stage 2: Secondary | ICD-10-CM | POA: Diagnosis not present

## 2021-02-20 DIAGNOSIS — K2100 Gastro-esophageal reflux disease with esophagitis, without bleeding: Secondary | ICD-10-CM

## 2021-02-20 DIAGNOSIS — R1033 Periumbilical pain: Secondary | ICD-10-CM

## 2021-02-20 DIAGNOSIS — R1013 Epigastric pain: Secondary | ICD-10-CM | POA: Diagnosis not present

## 2021-02-20 DIAGNOSIS — J9601 Acute respiratory failure with hypoxia: Secondary | ICD-10-CM | POA: Diagnosis not present

## 2021-02-20 DIAGNOSIS — R079 Chest pain, unspecified: Secondary | ICD-10-CM | POA: Diagnosis not present

## 2021-02-20 DIAGNOSIS — R1110 Vomiting, unspecified: Secondary | ICD-10-CM | POA: Diagnosis not present

## 2021-02-20 LAB — COMPREHENSIVE METABOLIC PANEL
ALT: 11 U/L (ref 0–44)
AST: 21 U/L (ref 15–41)
Albumin: 3.4 g/dL — ABNORMAL LOW (ref 3.5–5.0)
Alkaline Phosphatase: 77 U/L (ref 38–126)
Anion gap: 18 — ABNORMAL HIGH (ref 5–15)
BUN: 46 mg/dL — ABNORMAL HIGH (ref 6–20)
CO2: 26 mmol/L (ref 22–32)
Calcium: 10.5 mg/dL — ABNORMAL HIGH (ref 8.9–10.3)
Chloride: 91 mmol/L — ABNORMAL LOW (ref 98–111)
Creatinine, Ser: 10.42 mg/dL — ABNORMAL HIGH (ref 0.61–1.24)
GFR, Estimated: 6 mL/min — ABNORMAL LOW (ref >60)
Glucose, Bld: 94 mg/dL (ref 70–99)
Potassium: 4.1 mmol/L (ref 3.5–5.1)
Sodium: 135 mmol/L (ref 135–145)
Total Bilirubin: 0.4 mg/dL (ref 0.3–1.2)
Total Protein: 8.7 g/dL — ABNORMAL HIGH (ref 6.5–8.1)

## 2021-02-20 LAB — CBC WITH DIFFERENTIAL/PLATELET
Abs Immature Granulocytes: 0.02 10*3/uL (ref 0.00–0.07)
Basophils Absolute: 0 10*3/uL (ref 0.0–0.1)
Basophils Relative: 0 %
Eosinophils Absolute: 0.1 10*3/uL (ref 0.0–0.5)
Eosinophils Relative: 1 %
HCT: 32.7 % — ABNORMAL LOW (ref 39.0–52.0)
Hemoglobin: 11 g/dL — ABNORMAL LOW (ref 13.0–17.0)
Immature Granulocytes: 0 %
Lymphocytes Relative: 22 %
Lymphs Abs: 2.6 10*3/uL (ref 0.7–4.0)
MCH: 29.8 pg (ref 26.0–34.0)
MCHC: 33.6 g/dL (ref 30.0–36.0)
MCV: 88.6 fL (ref 80.0–100.0)
Monocytes Absolute: 1.2 10*3/uL — ABNORMAL HIGH (ref 0.1–1.0)
Monocytes Relative: 10 %
Neutro Abs: 7.9 10*3/uL — ABNORMAL HIGH (ref 1.7–7.7)
Neutrophils Relative %: 67 %
Platelets: 226 10*3/uL (ref 150–400)
RBC: 3.69 MIL/uL — ABNORMAL LOW (ref 4.22–5.81)
RDW: 14.8 % (ref 11.5–15.5)
WBC: 11.8 10*3/uL — ABNORMAL HIGH (ref 4.0–10.5)
nRBC: 0 % (ref 0.0–0.2)

## 2021-02-20 LAB — LIPASE, BLOOD: Lipase: 27 U/L (ref 11–51)

## 2021-02-20 LAB — TROPONIN I (HIGH SENSITIVITY)
Troponin I (High Sensitivity): 145 ng/L (ref <18)
Troponin I (High Sensitivity): 135 ng/L (ref <18)

## 2021-02-20 MED ORDER — DICYCLOMINE HCL 10 MG PO CAPS: 10.0000 mg | ORAL_CAPSULE | Freq: Three times a day (TID) | ORAL | Status: DC

## 2021-02-20 MED ORDER — HYDRALAZINE HCL 25 MG PO TABS
50.0000 mg | ORAL_TABLET | Freq: Three times a day (TID) | ORAL | Status: DC
  Administered 2021-02-20: 50 mg via ORAL
  Filled 2021-02-20 (×2): qty 2

## 2021-02-20 MED ORDER — ALLOPURINOL 100 MG PO TABS
100.0000 mg | ORAL_TABLET | Freq: Every day | ORAL | Status: DC
  Administered 2021-02-20 – 2021-02-21 (×2): 100 mg via ORAL
  Filled 2021-02-20 (×2): qty 1

## 2021-02-20 MED ORDER — AMITRIPTYLINE HCL 10 MG PO TABS
10.0000 mg | ORAL_TABLET | Freq: Every day | ORAL | Status: DC
  Administered 2021-02-20: 10 mg via ORAL
  Filled 2021-02-20: qty 1

## 2021-02-20 MED ORDER — HYDROCODONE-ACETAMINOPHEN 5-325 MG PO TABS: 2.0000 | ORAL_TABLET | ORAL | Status: DC | PRN

## 2021-02-20 MED ORDER — CALCIUM ACETATE (PHOS BINDER) 667 MG PO CAPS
2001.0000 mg | ORAL_CAPSULE | Freq: Three times a day (TID) | ORAL | Status: DC
  Administered 2021-02-21 (×2): 2001 mg via ORAL
  Filled 2021-02-20 (×2): qty 3

## 2021-02-20 MED ORDER — TACROLIMUS 1 MG PO CAPS
6.0000 mg | ORAL_CAPSULE | Freq: Every day | ORAL | Status: DC
  Administered 2021-02-20: 6 mg via ORAL
  Filled 2021-02-20 (×4): qty 6

## 2021-02-20 MED ORDER — FOLIC ACID 1 MG PO TABS
1.0000 mg | ORAL_TABLET | Freq: Every day | ORAL | Status: DC
  Administered 2021-02-20 – 2021-02-21 (×2): 1 mg via ORAL
  Filled 2021-02-20 (×2): qty 1

## 2021-02-20 MED ORDER — TACROLIMUS 1 MG PO CAPS
5.0000 mg | ORAL_CAPSULE | Freq: Every day | ORAL | Status: DC
  Administered 2021-02-21: 5 mg via ORAL
  Filled 2021-02-20 (×3): qty 5

## 2021-02-20 MED ORDER — PANTOPRAZOLE SODIUM 40 MG IV SOLR
40.0000 mg | Freq: Two times a day (BID) | INTRAVENOUS | Status: DC
  Administered 2021-02-20 – 2021-02-21 (×2): 40 mg via INTRAVENOUS
  Filled 2021-02-20 (×2): qty 40

## 2021-02-20 MED ORDER — FAMOTIDINE 20 MG PO TABS
10.0000 mg | ORAL_TABLET | Freq: Every day | ORAL | Status: DC
  Administered 2021-02-20 – 2021-02-21 (×2): 10 mg via ORAL
  Filled 2021-02-20 (×2): qty 1

## 2021-02-20 MED ORDER — SODIUM CHLORIDE 0.9 % IV SOLN
2.0000 g | INTRAVENOUS | Status: DC
  Administered 2021-02-20: 2 g via INTRAVENOUS
  Filled 2021-02-20: qty 20

## 2021-02-20 MED ORDER — PREDNISONE 10 MG PO TABS
5.0000 mg | ORAL_TABLET | Freq: Every day | ORAL | Status: DC
  Administered 2021-02-21: 5 mg via ORAL
  Filled 2021-02-20: qty 1

## 2021-02-20 MED ORDER — MYCOPHENOLATE MOFETIL 250 MG PO CAPS
500.0000 mg | ORAL_CAPSULE | Freq: Two times a day (BID) | ORAL | Status: DC
  Administered 2021-02-20 – 2021-02-21 (×2): 500 mg via ORAL
  Filled 2021-02-20 (×2): qty 2

## 2021-02-20 MED ORDER — COLESTIPOL HCL 1 G PO TABS
1.0000 g | ORAL_TABLET | Freq: Every day | ORAL | Status: DC
  Administered 2021-02-21: 1 g via ORAL
  Filled 2021-02-20 (×3): qty 1

## 2021-02-20 MED ORDER — TACROLIMUS 1 MG PO CAPS: 1.0000 mg | ORAL_CAPSULE | Freq: Two times a day (BID) | ORAL | Status: DC

## 2021-02-20 MED ORDER — METRONIDAZOLE IN NACL 5-0.79 MG/ML-% IV SOLN
500.0000 mg | Freq: Three times a day (TID) | INTRAVENOUS | Status: DC
  Administered 2021-02-20 – 2021-02-21 (×3): 500 mg via INTRAVENOUS
  Filled 2021-02-20 (×3): qty 100

## 2021-02-20 MED ORDER — CHLORHEXIDINE GLUCONATE CLOTH 2 % EX PADS: 6.0000 | MEDICATED_PAD | Freq: Every day | CUTANEOUS | Status: DC

## 2021-02-20 MED ORDER — GABAPENTIN 100 MG PO CAPS
100.0000 mg | ORAL_CAPSULE | Freq: Three times a day (TID) | ORAL | Status: DC
  Administered 2021-02-20 – 2021-02-21 (×2): 100 mg via ORAL
  Filled 2021-02-20 (×2): qty 1

## 2021-02-20 MED ORDER — ONDANSETRON HCL 4 MG/2ML IJ SOLN: 4.0000 mg | Freq: Four times a day (QID) | INTRAMUSCULAR | Status: DC | PRN

## 2021-02-20 MED ORDER — SIROLIMUS 1 MG PO TABS
1.0000 mg | ORAL_TABLET | Freq: Every day | ORAL | Status: DC
  Administered 2021-02-21: 1 mg via ORAL
  Filled 2021-02-20 (×4): qty 1

## 2021-02-20 MED ORDER — HEPARIN SODIUM (PORCINE) 5000 UNIT/ML IJ SOLN
5000.0000 [IU] | Freq: Three times a day (TID) | INTRAMUSCULAR | Status: DC
  Administered 2021-02-21: 5000 [IU] via SUBCUTANEOUS
  Filled 2021-02-20: qty 1

## 2021-02-20 MED ORDER — LOPERAMIDE HCL 2 MG PO CAPS: 2.0000 mg | ORAL_CAPSULE | ORAL | Status: DC | PRN

## 2021-02-20 MED ORDER — RENA-VITE PO TABS
1.0000 | ORAL_TABLET | Freq: Every day | ORAL | Status: DC
  Administered 2021-02-21: 1 via ORAL
  Filled 2021-02-20: qty 1

## 2021-02-20 MED ORDER — PRAVASTATIN SODIUM 40 MG PO TABS
40.0000 mg | ORAL_TABLET | Freq: Every day | ORAL | Status: DC
  Administered 2021-02-20 – 2021-02-21 (×2): 40 mg via ORAL
  Filled 2021-02-20 (×2): qty 1

## 2021-02-20 MED ORDER — MELATONIN 3 MG PO TABS
4.5000 mg | ORAL_TABLET | Freq: Every evening | ORAL | Status: DC | PRN
  Administered 2021-02-20: 4.5 mg via ORAL
  Filled 2021-02-20: qty 2

## 2021-02-20 MED ORDER — IOHEXOL 300 MG/ML  SOLN
100.0000 mL | Freq: Once | INTRAMUSCULAR | Status: AC | PRN
  Administered 2021-02-20: 100 mL via INTRAVENOUS

## 2021-02-20 MED ORDER — MAGNESIUM OXIDE 400 (241.3 MG) MG PO TABS
400.0000 mg | ORAL_TABLET | Freq: Every day | ORAL | Status: DC
  Administered 2021-02-20 – 2021-02-21 (×2): 400 mg via ORAL
  Filled 2021-02-20 (×2): qty 1

## 2021-02-20 MED ORDER — CARVEDILOL 3.125 MG PO TABS
6.2500 mg | ORAL_TABLET | Freq: Two times a day (BID) | ORAL | Status: DC
  Administered 2021-02-20: 6.25 mg via ORAL
  Filled 2021-02-20 (×2): qty 2

## 2021-02-20 MED ORDER — DILTIAZEM HCL ER COATED BEADS 180 MG PO CP24
360.0000 mg | ORAL_CAPSULE | Freq: Every day | ORAL | Status: DC
  Administered 2021-02-20: 360 mg via ORAL
  Filled 2021-02-20 (×2): qty 2

## 2021-02-20 MED ORDER — POLYSACCHARIDE IRON COMPLEX 150 MG PO CAPS
150.0000 mg | ORAL_CAPSULE | Freq: Every day | ORAL | Status: DC
  Administered 2021-02-20 – 2021-02-21 (×2): 150 mg via ORAL
  Filled 2021-02-20 (×2): qty 1

## 2021-02-20 MED ORDER — HYOSCYAMINE SULFATE 0.125 MG PO TBDP
0.1250 mg | ORAL_TABLET | Freq: Four times a day (QID) | ORAL | Status: DC | PRN
  Filled 2021-02-20: qty 1

## 2021-02-20 MED ORDER — FAMOTIDINE 20 MG PO TABS: 20.0000 mg | ORAL_TABLET | Freq: Every day | ORAL | Status: DC

## 2021-02-20 MED ORDER — CYCLOBENZAPRINE HCL 10 MG PO TABS
10.0000 mg | ORAL_TABLET | Freq: Two times a day (BID) | ORAL | Status: DC | PRN
  Administered 2021-02-20: 10 mg via ORAL
  Filled 2021-02-20: qty 1

## 2021-02-20 MED ORDER — CYCLOBENZAPRINE HCL 10 MG PO TABS: 10.0000 mg | ORAL_TABLET | Freq: Two times a day (BID) | ORAL | Status: DC

## 2021-02-20 MED ORDER — HYDROCODONE-ACETAMINOPHEN 5-325 MG PO TABS
1.0000 | ORAL_TABLET | ORAL | Status: DC | PRN
  Administered 2021-02-20 – 2021-02-21 (×3): 1 via ORAL
  Filled 2021-02-20 (×4): qty 1

## 2021-02-20 MED ORDER — MULTIVITAMIN ADULT PO TABS: 1.0000 | ORAL_TABLET | Freq: Every day | ORAL | Status: DC

## 2021-02-20 MED ORDER — FENTANYL CITRATE (PF) 100 MCG/2ML IJ SOLN
50.0000 ug | Freq: Once | INTRAMUSCULAR | Status: AC
  Administered 2021-02-20: 50 ug via INTRAVENOUS
  Filled 2021-02-20: qty 2

## 2021-02-20 NOTE — H&P (Signed)
TRH H&P   Patient Demographics:    Antonio Powell, is a 45 y.o. male  MRN: [id number]   DOB - [birth year]  Admit Date - 02/20/2021  Outpatient Primary MD for the patient is Medicine, [facility]  Referring MD/NP/PA: Dr Alvino Chapel   Outpatient Specialists: heart transplant at [facility]  Patient coming from: home  Chief Complaint  Patient presents with  . Abdominal Pain      HPI:    Antonio Powell  is a 45 y.o. male with past medical history of heart transplant in 2001 on immunosuppressant, ESRD on hemodialysis TTS, hypertension, hyperlipidemia. -Patient presents due to complaints of abdominal pain, nausea and vomiting, and diarrhea, she reports complains of a chronic abdominal pain, chronic diarrhea, reports he is compliant with his immunosuppressive therapy, he presents to ED secondary to worsening abdominal pain, reports in the upper abdomen, as well he does report diarrhea, with no change, denies any bright red blood per rectum, reports heartburn with no improvement with Nexium, patient with recent prolonged hospitalization in January at [facility] where he required intubation, and recently had follow-up EGD which was significant for esophagitis and small stomach mass which was resected with biopsy significant only for hyperplasia which is benign. -In ED and for hemoglobin at 11, white blood cell count of 11.8, troponins 145> 135, the abdomen pelvis significant for stomach findings suspicious of gastritis versus ulcer, as well he had evidence of colitis, ED physician discussed with [facility] transplant team who reported patient can be admitted here and no need to transfer to [facility] well they have no available beds), [facility]  hospitalist consulted to admit.    Review of systems:    In addition to the HPI above,  No Fever-chills, appetite No Headache, No changes with Vision or  hearing, No problems swallowing food or Liquids, No Chest pain, Cough or Shortness of Breath, Acute on chronic abdominal pain, diarrhea (chronic, nausea and vomiting No Blood in stool or Urine, No dysuria, No new skin rashes or bruises, No new joints pains-aches,  No new weakness, tingling, numbness in any extremity, No recent weight gain or loss, No polyuria, polydypsia or polyphagia, No significant Mental Stressors.  A full 10 point Review of Systems was done, except as stated above, all other Review of Systems were negative.   With Past History of the following :    Past Medical History:  Diagnosis Date  . Heart transplant recipient [facility])   . Hypertension       Past Surgical History:  Procedure Laterality Date  . APPENDECTOMY    . CARDIAC SURGERY    . CHOLECYSTECTOMY    . HERNIA REPAIR    . left ear        Social History:     Social History   Tobacco Use  . Smoking status: Current Every Day Smoker    Packs/day: 0.50  Types: Cigarettes  . Smokeless tobacco: Never Used  . Tobacco comment: black and mild  Substance Use Topics  . Alcohol use: Yes    Comment: occ      Family History :   Family history was reviewed, nonpertinent   Home Medications:   Prior to Admission medications   Medication Sig Start Date End Date Taking? Authorizing Provider  acetaminophen (TYLENOL) 500 MG tablet Take 500 mg by mouth every 4 (four) hours as needed for moderate pain.    [provider]  allopurinol (ZYLOPRIM) 100 MG tablet Take 100 mg by mouth daily.    [provider]  amitriptyline (ELAVIL) 10 MG tablet Take 10 mg by mouth at bedtime. 01/12/21   [provider]  aspirin 81 MG chewable tablet Chew 81 mg by mouth daily.    [provider]  calcium acetate (PHOSLO) 667 MG capsule Take 2,001 mg by mouth 3 (three) times daily with meals. 2001 mg with meals and 1334 with snacks 06/10/20   [provider]  carvedilol (COREG) 6.25 MG  tablet Take 6.25 mg by mouth 2 (two) times daily.     [provider]  colestipol (COLESTID) 1 g tablet Take 1 tablet by mouth daily. 09/16/20   [provider]  cyclobenzaprine (FLEXERIL) 10 MG tablet Take 10 mg by mouth 2 (two) times daily. 10/22/20   [provider]  dicyclomine (BENTYL) 10 MG capsule Take by mouth. 12/23/18   [provider]  diltiazem (CARDIZEM CD) 360 MG 24 hr capsule Take 360 mg by mouth daily. 01/12/21   [provider]  diltiazem (TIAZAC) 360 MG 24 hr capsule Take 360 mg by mouth daily.    [provider]  doxycycline (VIBRA-TABS) 100 MG tablet Take 100 mg by mouth daily. 01/09/19   [provider]  folic acid (FOLVITE) 1 MG tablet Take 1 mg by mouth daily.    [provider]  furosemide (LASIX) 20 MG tablet Take 20 mg by mouth daily. Patient not taking: Reported on 01/16/2021    [provider]  gabapentin (NEURONTIN) 100 MG capsule Take 100 mg by mouth 3 (three) times daily. 11/03/20   [provider]  hydrALAZINE (APRESOLINE) 50 MG tablet Take 50 mg by mouth 3 (three) times daily.    [provider]  hyoscyamine (ANASPAZ) 0.125 MG TBDP disintergrating tablet Place 0.125 mg under the tongue every 6 (six) hours as needed for cramping. 02/12/20 02/11/21  [provider]  iron polysaccharides (NIFEREX) 150 MG capsule Take 150 mg by mouth daily.    [provider]  linaclotide Rolan Lipa) 145 MCG CAPS capsule Take 145 mcg by mouth daily before breakfast. Patient not taking: Reported on 01/16/2021    [provider]  loperamide (IMODIUM) 2 MG capsule Take 2-4 mg by mouth every 6 (six) hours as needed. 11/11/20   [provider]  magnesium oxide (MAG-OX) 400 MG tablet Take 400 mg by mouth daily.    [provider]  melatonin 3 MG TABS tablet TAKE 2 TABLETS BY MOUTH NIGHTLY AS NEEDED SLEEP. 07/24/19 05/14/21  [provider]  Multiple  Vitamins-Minerals (MULTIVITAMIN ADULT) TABS Take 1 tablet by mouth daily.    [provider]  multivitamin (RENA-VIT) TABS tablet Take 1 tablet by mouth daily. 08/17/19 08/29/21  [provider]  mycophenolate (CELLCEPT) 250 MG capsule TAKE 2 CAPSULES BY MOUTH 2 TIMES DAILY. 12/20/20 01/12/22  [provider]  ondansetron (ZOFRAN) 4 MG tablet Take by  mouth.    [provider]  pantoprazole (PROTONIX) 20 MG tablet Take 20 mg by mouth daily. 01/12/21   [provider]  pravastatin (PRAVACHOL) 40 MG tablet Take 40 mg by mouth daily.    [provider]  predniSONE (DELTASONE) 5 MG tablet  01/12/21   [provider]  ranitidine (ZANTAC) 150 MG tablet Take 150 mg by mouth 2 (two) times daily.    [provider]  sirolimus (RAPAMUNE) 1 MG tablet Take 1 mg by mouth daily.    [provider]  tacrolimus (PROGRAF) 1 MG capsule Take 1 mg by mouth 2 (two) times daily.    [provider]  traMADol (ULTRAM) 50 MG tablet Take 1 tablet (50 mg total) by mouth every 6 (six) hours as needed. Patient not taking: No sig reported 09/04/17   Kem Parkinson, PA-C     Allergies:     Allergies  Allergen Reactions  . Nsaids     Cant take due to heart transplant  . Tolmetin Other (See Comments)    Cant take due to heart transplant  . Varenicline Other (See Comments)    Vivid dreams  . Phenergan [Promethazine Hcl] Anxiety     Physical Exam:   Vitals  Blood pressure (!) 126/98, pulse 93, temperature 98.3 F (36.8 C), temperature source Rectal, resp. rate 18, height 5\' 11"  (1.803 m), weight 59 kg, SpO2 98 %.   1. General thin appearing male, laying in bed, no apparent distress  2. Normal affect and insight, Not Suicidal or Homicidal, Awake Alert, Oriented X 3.  3. No F.N deficits, ALL C.Nerves Intact, Strength 5/5 all 4 extremities, Sensation intact all 4 extremities, Plantars down going.  4. Ears and Eyes appear Normal,  Conjunctivae clear, PERRLA. Moist Oral Mucosa.  5. Supple Neck, No JVD, No cervical lymphadenopathy appriciated, No Carotid Bruits.  6. Symmetrical Chest wall movement, Good air movement bilaterally, CTAB.  7. RRR, No Gallops, Rubs or Murmurs, No Parasternal Heave.  8. Positive Bowel Sounds, Abdomen Soft, tenderness mainly in epigastric area, No organomegaly appriciated,No rebound -guarding or rigidity.  9.  No Cyanosis, Normal Skin Turgor, No Skin Rash or Bruise.  10. Good muscle tone,  joints appear normal , no effusions, Normal ROM.  11. No Palpable Lymph Nodes in Neck or Axillae    Data Review:    CBC Recent Labs  Lab 02/20/21 1526  WBC 11.8*  HGB 11.0*  HCT 32.7*  PLT 226  MCV 88.6  MCH 29.8  MCHC 33.6  RDW 14.8  LYMPHSABS 2.6  MONOABS 1.2*  EOSABS 0.1  BASOSABS 0.0   ------------------------------------------------------------------------------------------------------------------  Chemistries  Recent Labs  Lab 02/20/21 1526  NA 135  K 4.1  CL 91*  CO2 26  GLUCOSE 94  BUN 46*  CREATININE 10.42*  CALCIUM 10.5*  AST 21  ALT 11  ALKPHOS 77  BILITOT 0.4   ------------------------------------------------------------------------------------------------------------------ estimated creatinine clearance is 7.7 mL/min (A) (by C-G formula based on SCr of 10.42 mg/dL (H)). ------------------------------------------------------------------------------------------------------------------ No results for input(s): TSH, T4TOTAL, T3FREE, THYROIDAB in the last 72 hours.  Invalid input(s): FREET3  Coagulation profile No results for input(s): INR, PROTIME in the last 168 hours. ------------------------------------------------------------------------------------------------------------------- No results for input(s): DDIMER in the last 72  hours. -------------------------------------------------------------------------------------------------------------------  Cardiac Enzymes No results for input(s): CKMB, TROPONINI, MYOGLOBIN in the last 168 hours.  Invalid input(s): CK ------------------------------------------------------------------------------------------------------------------    Component Value Date/Time   BNP 1,125.4 (H) 08/25/2018 0721     ---------------------------------------------------------------------------------------------------------------  Urinalysis    Component Value Date/Time   COLORURINE YELLOW 01/02/2008 [place] 01/02/2008 1216   LABSPEC 1.020 01/02/2008 1216   PHURINE 5.0 01/02/2008 1216   GLUCOSEU NEGATIVE 01/02/2008 1216   HGBUR LARGE (A) 01/02/2008 1216   BILIRUBINUR NEGATIVE 01/02/2008 1216   KETONESUR NEGATIVE 01/02/2008 1216   PROTEINUR NEGATIVE 01/02/2008 1216   UROBILINOGEN 0.2 01/02/2008 1216   NITRITE NEGATIVE 01/02/2008 1216   LEUKOCYTESUR SMALL (A) 01/02/2008 1216    ----------------------------------------------------------------------------------------------------------------   Imaging Results:    DG Chest 2 View  Result Date: 02/20/2021 CLINICAL DATA:  Intermittent chest pain, heartburn, history of heart transplant EXAM: CHEST - 2 VIEW COMPARISON:  11/24/2020 FINDINGS: Frontal and lateral views of the chest demonstrate postsurgical changes from cardiac transplant. Stable pacemaker leads. Resolution of the airspace disease in the lung bases on prior study, with persistent bibasilar interstitial prominence which may reflect scarring. No effusion or pneumothorax. No acute bony abnormalities. IMPRESSION: 1. Bibasilar interstitial prominence consistent with scarring. No acute airspace disease. 2. Postsurgical changes from cardiac transplant. Electronically Signed   By: Randa Ngo M.D.   On: 02/20/2021 15:02   CT ABDOMEN PELVIS W CONTRAST  Result  Date: 02/20/2021 CLINICAL DATA:  Epigastric pain and burning for 2 weeks, vomiting EXAM: CT ABDOMEN AND PELVIS WITH CONTRAST TECHNIQUE: Multidetector CT imaging of the abdomen and pelvis was performed using the standard protocol following bolus administration of intravenous contrast. CONTRAST:  15mL OMNIPAQUE IOHEXOL 300 MG/ML  SOLN COMPARISON:  02/23/2020 FINDINGS: Lower chest: Bibasilar emphysema and scarring. Postsurgical changes from previous cardiac transplant. Hepatobiliary: No focal liver abnormality is seen. Status post cholecystectomy. No biliary dilatation. Pancreas: Unremarkable. No pancreatic ductal dilatation or surrounding inflammatory changes. Spleen: Normal in size without focal abnormality. Adrenals/Urinary Tract: Marked bilateral renal cortical atrophy consistent with history of end-stage renal disease. The adrenals are unremarkable. Bladder is decompressed, limiting its evaluation. Stomach/Bowel: No bowel obstruction or ileus. There is segmental wall thickening of the rectosigmoid colon compatible with inflammatory or infectious colitis. Mural thickening of the gastric antrum/pylorus, nonspecific. Mild gastric distension. Vascular/Lymphatic: Aortic atherosclerosis. No enlarged abdominal or pelvic lymph nodes. Reproductive: Prostate is unremarkable. Other: No free fluid or free gas.  No abdominal wall hernia. Musculoskeletal: No acute or destructive bony lesions. Diffuse changes of renal osteodystrophy. Reconstructed images demonstrate no additional findings. IMPRESSION: 1. Segmental wall thickening of the distal rectosigmoid colon, consistent with inflammatory or infectious colitis. 2. Mural thickening of the gastric antrum and pylorus, which could reflect gastritis or peptic ulcer disease. 3. Postsurgical changes from cardiac transplant. 4. Sequela of end-stage renal disease. 5.  Aortic Atherosclerosis (ICD10-I70.0). Electronically Signed   By: Randa Ngo M.D.   On: 02/20/2021 17:10    My  personal review of EKG: Rhythm NSR, Rate  95 /min, QTc 473   Assessment & Plan:    Active Problems:   Heart transplant recipient [facility])   ESRD (end stage renal disease) ([place])   Infectious colitis    Abdominal pain, acute on chronic -CT abdomen pelvis findings suspicious for gastritis versus peptic ulcer disease, patient with extensive upper GI history, most recent endoscopy 01/09/2021 at [facility] which was significant for esophagitis, polyp in the antrum which was resected(biopsy showing sure with recommendation to repeat EGD in 6 months. -We will increase his Protonix to 40 mg IV twice daily, he can be discharged on Protonix 40 mg p.o. twice daily given history of esophagitis on recent endoscopy and possible gastritis versus peptic ulcer. -  As well CT abdomen pelvis significant for colitis,(patient with known chronic diarrhea), will be started empirically on Rocephin and Flagyl, will check GI panel. -We will start on as needed Vicodin for pain.  ESRD -Renal consulted, he is due for hemodialysis tomorrow  Heart transplant -Continue with immunosuppressive therapy -Continue with cardiac medications -S/P PPM  Hypertension -Continue with home medications  Hyperlipidemia -Continue with statin  DVT Prophylaxis Heparin   AM Labs Ordered, also please review Full Orders  Family Communication: Admission, patients condition and plan of care including tests being ordered have been discussed with the patient  who indicate understanding and agree with the plan and Code Status.  Code Status Full  Likely DC to  home  Condition GUARDED    Consults called: renal    Admission status: observation    Time spent in minutes : 65 minutes   Phillips Climes M.D on 02/20/2021 at 7:00 PM   [facility] - Office  [phone]

## 2021-02-20 NOTE — ED Triage Notes (Signed)
Pt c/o abdominal pain and burning for the past 2 wks. Pt states he has vomited x2 today.

## 2021-02-20 NOTE — ED Notes (Signed)
Pt requesting more water and stomach med for burning/pain.

## 2021-02-20 NOTE — ED Notes (Signed)
Date and time results received: 02/20/21 &1619hrs   Test: Troponin  Critical Value: 145  Name of Provider Notified: EDP  Orders Received? Or Actions Taken?: Notified and new ekg requested

## 2021-02-20 NOTE — ED Provider Notes (Signed)
[place] Provider Note   CSN: [id number] Arrival date & time: 02/20/21  1247     History Chief Complaint  Patient presents with  . Abdominal Pain    Antonio Powell is a 45 y.o. male.  HPI Patient presents with abdominal pain.  Upper abdomen.  Began with heartburn a week or 2 ago.  States he could feel the acid coming up to his throat.  Has had some vomiting.  Has had diarrhea but that is not unusual for him.  Has had a previous heart transplant.  He is on immunosuppression.  He is a dialysis patient and did not go on Tuesday but did go yesterday on Thursday.  Has had previous cholecystectomy and appendectomy.  Pain is dull.  In the upper abdomen.  States eating may hurt the pain more.  He is on Nexium without relief.    Past Medical History:  Diagnosis Date  . Heart transplant recipient [facility])   . Hypertension     Patient Active Problem List   Diagnosis Date Noted  . Acute anemia 01/16/2021    Past Surgical History:  Procedure Laterality Date  . APPENDECTOMY    . CARDIAC SURGERY    . CHOLECYSTECTOMY    . HERNIA REPAIR    . left ear         No family history on file.  Social History   Tobacco Use  . Smoking status: Current Every Day Smoker    Packs/day: 0.50    Types: Cigarettes  . Smokeless tobacco: Never Used  . Tobacco comment: black and mild  Vaping Use  . Vaping Use: Never used  Substance Use Topics  . Alcohol use: Yes    Comment: occ  . Drug use: Yes    Frequency: 2.0 times per week    Types: Marijuana    Home Medications Prior to Admission medications   Medication Sig Start Date End Date Taking? Authorizing Provider  acetaminophen (TYLENOL) 500 MG tablet Take 500 mg by mouth every 4 (four) hours as needed for moderate pain.    [provider]  allopurinol (ZYLOPRIM) 100 MG tablet Take 100 mg by mouth daily.    [provider]  amitriptyline (ELAVIL) 10 MG tablet Take 10 mg by mouth at bedtime. 01/12/21    [provider]  aspirin 81 MG chewable tablet Chew 81 mg by mouth daily.    [provider]  calcium acetate (PHOSLO) 667 MG capsule Take 2,001 mg by mouth 3 (three) times daily with meals. 2001 mg with meals and 1334 with snacks 06/10/20   [provider]  carvedilol (COREG) 6.25 MG tablet Take 6.25 mg by mouth 2 (two) times daily.     [provider]  colestipol (COLESTID) 1 g tablet Take 1 tablet by mouth daily. 09/16/20   [provider]  cyclobenzaprine (FLEXERIL) 10 MG tablet Take 10 mg by mouth 2 (two) times daily. 10/22/20   [provider]  dicyclomine (BENTYL) 10 MG capsule Take by mouth. 12/23/18   [provider]  diltiazem (CARDIZEM CD) 360 MG 24 hr capsule Take 360 mg by mouth daily. 01/12/21   [provider]  diltiazem (TIAZAC) 360 MG 24 hr capsule Take 360 mg by mouth daily.    [provider]  doxycycline (VIBRA-TABS) 100 MG tablet Take 100 mg by mouth daily. 01/09/19   [provider]  folic acid (FOLVITE) 1 MG tablet Take 1 mg by mouth daily.  [provider]  furosemide (LASIX) 20 MG tablet Take 20 mg by mouth daily. Patient not taking: Reported on 01/16/2021    [provider]  gabapentin (NEURONTIN) 100 MG capsule Take 100 mg by mouth 3 (three) times daily. 11/03/20   [provider]  hydrALAZINE (APRESOLINE) 50 MG tablet Take 50 mg by mouth 3 (three) times daily.    [provider]  hyoscyamine (ANASPAZ) 0.125 MG TBDP disintergrating tablet Place 0.125 mg under the tongue every 6 (six) hours as needed for cramping. 02/12/20 02/11/21  [provider]  iron polysaccharides (NIFEREX) 150 MG capsule Take 150 mg by mouth daily.    [provider]  linaclotide Rolan Lipa) 145 MCG CAPS capsule Take 145 mcg by mouth daily before breakfast. Patient not taking: Reported on 01/16/2021    [provider]  loperamide (IMODIUM) 2 MG capsule Take  2-4 mg by mouth every 6 (six) hours as needed. 11/11/20   [provider]  magnesium oxide (MAG-OX) 400 MG tablet Take 400 mg by mouth daily.    [provider]  melatonin 3 MG TABS tablet TAKE 2 TABLETS BY MOUTH NIGHTLY AS NEEDED SLEEP. 07/24/19 05/14/21  [provider]  Multiple Vitamins-Minerals (MULTIVITAMIN ADULT) TABS Take 1 tablet by mouth daily.    [provider]  multivitamin (RENA-VIT) TABS tablet Take 1 tablet by mouth daily. 08/17/19 08/29/21  [provider]  mycophenolate (CELLCEPT) 250 MG capsule TAKE 2 CAPSULES BY MOUTH 2 TIMES DAILY. 12/20/20 01/12/22  [provider]  ondansetron (ZOFRAN) 4 MG tablet Take by mouth.    [provider]  pantoprazole (PROTONIX) 20 MG tablet Take 20 mg by mouth daily. 01/12/21   [provider]  pravastatin (PRAVACHOL) 40 MG tablet Take 40 mg by mouth daily.    [provider]  predniSONE (DELTASONE) 5 MG tablet  01/12/21   [provider]  ranitidine (ZANTAC) 150 MG tablet Take 150 mg by mouth 2 (two) times daily.    [provider]  sirolimus (RAPAMUNE) 1 MG tablet Take 1 mg by mouth daily.    [provider]  tacrolimus (PROGRAF) 1 MG capsule Take 1 mg by mouth 2 (two) times daily.    [provider]  traMADol (ULTRAM) 50 MG tablet Take 1 tablet (50 mg total) by mouth every 6 (six) hours as needed. Patient not taking: No sig reported 09/04/17   Triplett, Tammy, PA-C    Allergies    Nsaids, Tolmetin, Varenicline, and Phenergan [promethazine hcl]  Review of Systems   Review of Systems  Constitutional: Positive for appetite change.  HENT: Negative for congestion.   Respiratory: Negative for shortness of breath.   Gastrointestinal: Positive for abdominal pain, diarrhea, nausea and vomiting.  Genitourinary: Negative for frequency.  Musculoskeletal: Negative for back pain.  Skin: Negative for rash.  Neurological: Negative for weakness.   Psychiatric/Behavioral: Negative for confusion.    Physical Exam Updated Vital Signs BP (!) 128/95   Pulse 88   Temp 98.3 F (36.8 C) (Rectal)   Resp 19   Ht 5\' 11"  (1.803 m)   Wt 59 kg   SpO2 100%   BMI 18.13 kg/m   Physical Exam Vitals and nursing note reviewed.  HENT:     Head: Atraumatic.  Cardiovascular:     Rate and Rhythm: Regular rhythm.  Pulmonary:     Breath sounds: No wheezing.  Abdominal:     Comments: Diffuse tenderness, particularly in upper abdomen.  No rebound  or guarding.  No hernias palpated.  Skin:    Capillary Refill: Capillary refill takes less than 2 seconds.  Neurological:     Mental Status: He is alert and oriented to person, place, and time.  Psychiatric:        Mood and Affect: Mood normal.     ED Results / Procedures / Treatments   Labs (all labs ordered are listed, but only abnormal results are displayed) Labs Reviewed  COMPREHENSIVE METABOLIC PANEL - Abnormal; Notable for the following components:      Result Value   Chloride 91 (*)    BUN 46 (*)    Creatinine, Ser 10.42 (*)    Calcium 10.5 (*)    Total Protein 8.7 (*)    Albumin 3.4 (*)    GFR, Estimated 6 (*)    Anion gap 18 (*)    All other components within normal limits  CBC WITH DIFFERENTIAL/PLATELET - Abnormal; Notable for the following components:   WBC 11.8 (*)    RBC 3.69 (*)    Hemoglobin 11.0 (*)    HCT 32.7 (*)    Neutro Abs 7.9 (*)    Monocytes Absolute 1.2 (*)    All other components within normal limits  TROPONIN I (HIGH SENSITIVITY) - Abnormal; Notable for the following components:   Troponin I (High Sensitivity) 145 (*)    All other components within normal limits  TROPONIN I (HIGH SENSITIVITY) - Abnormal; Notable for the following components:   Troponin I (High Sensitivity) 135 (*)    All other components within normal limits  LIPASE, BLOOD    EKG EKG Interpretation  Date/Time:  Friday February 20 2021 16:21:51 EDT Ventricular Rate:  95 PR  Interval:  233 QRS Duration: 143 QT Interval:  376 QTC Calculation: 473 R Axis:   -53 Text Interpretation: Sinus rhythm Prolonged PR interval IVCD, consider atypical RBBB LVH with IVCD and secondary repol abnrm No significant change since last tracing Confirmed by Davonna Belling [phone]) on 02/20/2021 4:30:56 PM   Radiology DG Chest 2 View  Result Date: 02/20/2021 CLINICAL DATA:  Intermittent chest pain, heartburn, history of heart transplant EXAM: CHEST - 2 VIEW COMPARISON:  11/24/2020 FINDINGS: Frontal and lateral views of the chest demonstrate postsurgical changes from cardiac transplant. Stable pacemaker leads. Resolution of the airspace disease in the lung bases on prior study, with persistent bibasilar interstitial prominence which may reflect scarring. No effusion or pneumothorax. No acute bony abnormalities. IMPRESSION: 1. Bibasilar interstitial prominence consistent with scarring. No acute airspace disease. 2. Postsurgical changes from cardiac transplant. Electronically Signed   By: Randa Ngo M.D.   On: 02/20/2021 15:02   CT ABDOMEN PELVIS W CONTRAST  Result Date: 02/20/2021 CLINICAL DATA:  Epigastric pain and burning for 2 weeks, vomiting EXAM: CT ABDOMEN AND PELVIS WITH CONTRAST TECHNIQUE: Multidetector CT imaging of the abdomen and pelvis was performed using the standard protocol following bolus administration of intravenous contrast. CONTRAST:  164mL OMNIPAQUE IOHEXOL 300 MG/ML  SOLN COMPARISON:  02/23/2020 FINDINGS: Lower chest: Bibasilar emphysema and scarring. Postsurgical changes from previous cardiac transplant. Hepatobiliary: No focal liver abnormality is seen. Status post cholecystectomy. No biliary dilatation. Pancreas: Unremarkable. No pancreatic ductal dilatation or surrounding inflammatory changes. Spleen: Normal in size without focal abnormality. Adrenals/Urinary Tract: Marked bilateral renal cortical atrophy consistent with history of end-stage renal disease. The  adrenals are unremarkable. Bladder is decompressed, limiting its evaluation. Stomach/Bowel: No bowel obstruction or ileus. There is segmental wall thickening of the rectosigmoid colon compatible  with inflammatory or infectious colitis. Mural thickening of the gastric antrum/pylorus, nonspecific. Mild gastric distension. Vascular/Lymphatic: Aortic atherosclerosis. No enlarged abdominal or pelvic lymph nodes. Reproductive: Prostate is unremarkable. Other: No free fluid or free gas.  No abdominal wall hernia. Musculoskeletal: No acute or destructive bony lesions. Diffuse changes of renal osteodystrophy. Reconstructed images demonstrate no additional findings. IMPRESSION: 1. Segmental wall thickening of the distal rectosigmoid colon, consistent with inflammatory or infectious colitis. 2. Mural thickening of the gastric antrum and pylorus, which could reflect gastritis or peptic ulcer disease. 3. Postsurgical changes from cardiac transplant. 4. Sequela of end-stage renal disease. 5.  Aortic Atherosclerosis (ICD10-I70.0). Electronically Signed   By: Randa Ngo M.D.   On: 02/20/2021 17:10    Procedures Procedures   Medications Ordered in ED Medications  iohexol (OMNIPAQUE) 300 MG/ML solution 100 mL (100 mLs Intravenous Contrast Given 02/20/21 1649)  fentaNYL (SUBLIMAZE) injection 50 mcg (50 mcg Intravenous Given 02/20/21 1823)    ED Course  I have reviewed the triage vital signs and the nursing notes.  Pertinent labs & imaging results that were available during my care of the patient were reviewed by me and considered in my medical decision making (see chart for details).    MDM Rules/Calculators/A&P                          Patient presented with abdominal pain.  Upper abdominal pain and lower abdominal pain.  Has had diarrhea for a while now.  States he has been having difficulty eating and drinking.  Still nauseous.  Has had previous heart transplant.  Troponin mildly elevated but stable.  EKG  reassuring.  I think the chest pain is likely due to reflux.  CT scan done and showed potential gastritis and colitis.  Discussed with transplant cardiology at [facility] where he is managed.  They feel he requires admission to the hospital would not necessarily have to be at [facility] from a cardiac standpoint.  They would recommend checking for CMV and EBV.  And get levels of his immunosuppressants.  They would be open to consult by phone if needed. Patient has had continued pain.  Also has dialysis and is due for dialysis tomorrow.  I feel with the pain and decreased oral intakes patient would benefit from admission to the hospital for fluids and potentially antibiotics.  Will discuss with hospitalist. Final Clinical Impression(s) / ED Diagnoses Final diagnoses:  Colitis  Abdominal pain, unspecified abdominal location  End stage renal disease on dialysis [facility])    Rx / DC Orders ED Discharge Orders    None       Davonna Belling, MD 02/20/21 1836

## 2021-02-20 NOTE — ED Notes (Addendum)
Date and time results received: 02/20/21 1822  Test: Troponin Critical Value: 135  Name of Provider Notified: Alvino Chapel, MD

## 2021-02-20 NOTE — ED Triage Notes (Signed)
Emergency Medicine Provider Triage Evaluation Note  Antonio Powell , a 45 y.o. male with a history of ESRD on dialysis (T/TH/S), heart transplant,, who was evaluated in triage.  Pt complains of abd pain. States sxs started with heartburn about 1-2 weeks ago and he later developed epigastric abdominal pain nausea and vomiting.  He is also had some diarrhea as well.  States that the heartburn has since resolved.  There have been no fevers or shortness of breath.  He does report he has been able to keep down his immunosuppressants.  He missed dialysis on Tuesday but was able to go yesterday and states he has dialyzed down below his dry weight.  He has a history of cholecystectomy and appendectomy.  He does not drink any alcohol.  He denies any hematochezia, melena or hematemesis.  Review of Systems  Positive: Abd, nvd Negative: Fevers  Physical Exam  BP 120/88 (BP Location: Left Arm)   Pulse (!) 113   Temp 98.3 F (36.8 C) (Oral)   Resp 18   Ht 5\' 11"  (1.803 m)   Wt 59 kg   SpO2 97%   BMI 18.13 kg/m  Gen:   Awake, no distress   HEENT:  Atraumatic  Resp:  Normal effort  Cardiac:  tachycardic Abd:   Nondistended, TTP diffusely, worse to the upper abd MSK:   Moves extremities without difficulty Neuro:  Speech clear   Medical Decision Making  Medically screening exam initiated at 2:23 PM.  Appropriate orders placed.  Antonio Powell was informed that the remainder of the evaluation will be completed by another provider, this initial triage assessment does not replace that evaluation, and the importance of remaining in the ED until their evaluation is complete.  Clinical Impression   45 y/o M presenting with abd pain. Labs and CT imaging ordered.   MSE was initiated and I personally evaluated the patient and placed orders (if any) at  2:23 PM on February 20, 2021.  The patient appears stable so that the remainder of the MSE may be completed by another provider.    Rodney Booze, PA-C 02/20/21 1423

## 2021-02-20 NOTE — Consult Note (Signed)
ESRD Consult Note [facility]  Requesting provider: Elgergawy, Silver Huguenin, MD  Outpatient dialysis unit: [facility] Outpatient dialysis schedule: TTS  Assessment/Recommendations:   ESRD:  -HD today per his TTS schedule, outpatient orders to be sent to [place] -called his outpatient dialysis unit on 4/16 am and spoke with the HD RN there for orders. 3hrs 54min, 2k, 3cal, revaclear 300. Awaiting outpatient orders to be faxed   Abdominal pain secondary to colitis -per primary service. Of note, he is on mycophenolate. Recommendation to the primary service is to call his transplant team to see if it is reasonable to cut back to 250mg  BID because of his colitis. Recommend checking CMV -abx per primary service -pain significantly improved  Volume/ hypertension: EDW 58.5, he has been under EDW per his outpatient unit. Plan for 1-2L UF challenge as tolerated. Discussed with HD RN. New edw to be determined post HD  Anemia of Chronic Kidney Disease: Hemoglobin 11 on admit, down to 8.4 today which is around his baseline it seems (hgb of 11 likely hemoconcentrated). Receiving epo 10k qtreatment, last dose thurs  Secondary Hyperparathyroidism/Hyperphosphatemia: 2cal bath ordered for Saturday's treatment rather than 3Cal (calcium 10.5 on admission), not on any VDRA (outpatient orders to be faxed). Resume home binders, renal diet, check phos  Vascular access: LUE AVF +b/t  Orthotopic heart transplant 2001, h/o NICM -followed at [facility], resume home immunosuppression  Additional recommendations: - Dose all meds for creatinine clearance < 10 ml/min  - Unless absolutely necessary, no MRIs with gadolinium.  - Implement save arm precautions.  Prefer needle sticks in the dorsum of the hands or wrists.  No blood pressure measurements in arm. - If blood transfusion is requested during hemodialysis sessions, please alert [place] prior to the session.  - If a hemodialysis catheter line culture is  requested, please alert [place] as only hemodialysis nurses are able to collect those specimens.   Recommendations were discussed with the primary team.  Gean Quint, MD [facility]   History of Present Illness: Antonio Powell is a 45 y.o. male with a past medical history of ESRD on HD, h/o NICM s/p BiVAD and s/p OHT 12/25/1999, recent COVID pneumonia requiring intubation (hospital course complicated by hemorrhagic shock-iatrogenic secondary to heparin bolus, encephalopathy), chronic abdominal pain, HTN, CHF who presents with abdominal pain with vomiting. He does have chronic diarrhea.  Last HD Thursday however was vomiting then as per outpatient HD RN. Was actually under his EDW therefore no UF that day.  He typically misses HD about one day every other week.  No issues with his AVF per outpatient HD staff.  Today, patient currently feels a lot better. He reports that this is the best he has felt all week. Tolerated breakfast this am. No other complaints at the moment. He denies fevers, chills, chest pain, issues with AVF, abd pain, n/v/d currently.  Medications:  Current Facility-Administered Medications  Medication Dose Route Frequency Provider Last Rate Last Admin  . ondansetron (ZOFRAN) injection 4 mg  4 mg Intravenous Q6H PRN Elgergawy, Silver Huguenin, MD      . pantoprazole (PROTONIX) injection 40 mg  40 mg Intravenous Q12H Elgergawy, Silver Huguenin, MD       Current Outpatient Medications  Medication Sig Dispense Refill  . acetaminophen (TYLENOL) 500 MG tablet Take 500 mg by mouth every 4 (four) hours as needed for moderate pain.    Marland Kitchen allopurinol (ZYLOPRIM) 100 MG tablet Take 100 mg by mouth daily.    Marland Kitchen  amitriptyline (ELAVIL) 10 MG tablet Take 10 mg by mouth at bedtime.    Marland Kitchen aspirin 81 MG chewable tablet Chew 81 mg by mouth daily.    . calcium acetate (PHOSLO) 667 MG capsule Take 2,001 mg by mouth 3 (three) times daily with meals. 2001 mg with meals and 1334 with snacks    .  carvedilol (COREG) 6.25 MG tablet Take 6.25 mg by mouth 2 (two) times daily.     . colestipol (COLESTID) 1 g tablet Take 1 tablet by mouth daily.    . cyclobenzaprine (FLEXERIL) 10 MG tablet Take 10 mg by mouth 2 (two) times daily.    Marland Kitchen dicyclomine (BENTYL) 10 MG capsule Take by mouth.    . diltiazem (CARDIZEM CD) 360 MG 24 hr capsule Take 360 mg by mouth daily.    Marland Kitchen diltiazem (TIAZAC) 360 MG 24 hr capsule Take 360 mg by mouth daily.    Marland Kitchen doxycycline (VIBRA-TABS) 100 MG tablet Take 100 mg by mouth daily.    . folic acid (FOLVITE) 1 MG tablet Take 1 mg by mouth daily.    . furosemide (LASIX) 20 MG tablet Take 20 mg by mouth daily. (Patient not taking: Reported on 01/16/2021)    . gabapentin (NEURONTIN) 100 MG capsule Take 100 mg by mouth 3 (three) times daily.    . hydrALAZINE (APRESOLINE) 50 MG tablet Take 50 mg by mouth 3 (three) times daily.    . hyoscyamine (ANASPAZ) 0.125 MG TBDP disintergrating tablet Place 0.125 mg under the tongue every 6 (six) hours as needed for cramping.    . iron polysaccharides (NIFEREX) 150 MG capsule Take 150 mg by mouth daily.    Marland Kitchen linaclotide (LINZESS) 145 MCG CAPS capsule Take 145 mcg by mouth daily before breakfast. (Patient not taking: Reported on 01/16/2021)    . loperamide (IMODIUM) 2 MG capsule Take 2-4 mg by mouth every 6 (six) hours as needed.    . magnesium oxide (MAG-OX) 400 MG tablet Take 400 mg by mouth daily.    . melatonin 3 MG TABS tablet TAKE 2 TABLETS BY MOUTH NIGHTLY AS NEEDED SLEEP.    . Multiple Vitamins-Minerals (MULTIVITAMIN ADULT) TABS Take 1 tablet by mouth daily.    . multivitamin (RENA-VIT) TABS tablet Take 1 tablet by mouth daily.    . mycophenolate (CELLCEPT) 250 MG capsule TAKE 2 CAPSULES BY MOUTH 2 TIMES DAILY.    Marland Kitchen ondansetron (ZOFRAN) 4 MG tablet Take by mouth.    . pantoprazole (PROTONIX) 20 MG tablet Take 20 mg by mouth daily.    . pravastatin (PRAVACHOL) 40 MG tablet Take 40 mg by mouth daily.    . predniSONE (DELTASONE) 5 MG  tablet     . ranitidine (ZANTAC) 150 MG tablet Take 150 mg by mouth 2 (two) times daily.    . sirolimus (RAPAMUNE) 1 MG tablet Take 1 mg by mouth daily.    . tacrolimus (PROGRAF) 1 MG capsule Take 1 mg by mouth 2 (two) times daily.    . traMADol (ULTRAM) 50 MG tablet Take 1 tablet (50 mg total) by mouth every 6 (six) hours as needed. (Patient not taking: No sig reported) 12 tablet 0     ALLERGIES Nsaids, Tolmetin, Varenicline, and Phenergan [promethazine hcl]  MEDICAL HISTORY Past Medical History:  Diagnosis Date  . Heart transplant recipient [facility])   . Hypertension      SOCIAL HISTORY Social History   Socioeconomic History  . Marital status: Single    Spouse name: Not  on file  . Number of children: Not on file  . Years of education: Not on file  . Highest education level: Not on file  Occupational History  . Not on file  Tobacco Use  . Smoking status: Current Every Day Smoker    Packs/day: 0.50    Types: Cigarettes  . Smokeless tobacco: Never Used  . Tobacco comment: black and mild  Vaping Use  . Vaping Use: Never used  Substance and Sexual Activity  . Alcohol use: Yes    Comment: occ  . Drug use: Yes    Frequency: 2.0 times per week    Types: Marijuana  . Sexual activity: Not on file  Other Topics Concern  . Not on file  Social History Narrative  . Not on file   Social Determinants of Health   Financial Resource Strain: Not on file  Food Insecurity: Not on file  Transportation Needs: Not on file  Physical Activity: Not on file  Stress: Not on file  Social Connections: Not on file  Intimate Partner Violence: Not on file     FAMILY HISTORY No family history on file.   Review of Systems: 12 systems were reviewed and negative except per HPI  Physical Exam: Vitals:   02/20/21 1730 02/20/21 1800  BP: (!) 122/91 (!) 128/95  Pulse: 91 88  Resp: 19 19  Temp:    SpO2: 100% 100%   No intake/output data recorded. No intake or output data in the 24 hours  ending 02/20/21 1859 General: well-appearing, no acute distress, laying flat in bed HEENT: anicteric sclera, MMM CV: normal rate Lungs: bilateral chest rise, normal wob Abd: soft, non-tender, non-distended Skin: no visible lesions or rashes Msk: no edema Psych: alert, engaged, appropriate mood and affect Neuro: normal speech, no gross focal deficits  Dialysis access: LUE AVF +b/t  Test Results Reviewed Lab Results  Component Value Date   NA 135 02/20/2021   K 4.1 02/20/2021   CL 91 (L) 02/20/2021   CO2 26 02/20/2021   BUN 46 (H) 02/20/2021   CREATININE 10.42 (H) 02/20/2021   CALCIUM 10.5 (H) 02/20/2021   ALBUMIN 3.4 (L) 02/20/2021    I have reviewed relevant outside healthcare records

## 2021-02-21 ENCOUNTER — Encounter (HOSPITAL_COMMUNITY): Payer: Self-pay | Admitting: Internal Medicine

## 2021-02-21 DIAGNOSIS — Z941 Heart transplant status: Secondary | ICD-10-CM | POA: Diagnosis not present

## 2021-02-21 DIAGNOSIS — Z992 Dependence on renal dialysis: Secondary | ICD-10-CM | POA: Diagnosis not present

## 2021-02-21 DIAGNOSIS — I132 Hypertensive heart and chronic kidney disease with heart failure and with stage 5 chronic kidney disease, or end stage renal disease: Secondary | ICD-10-CM | POA: Diagnosis not present

## 2021-02-21 DIAGNOSIS — K529 Noninfective gastroenteritis and colitis, unspecified: Secondary | ICD-10-CM | POA: Diagnosis not present

## 2021-02-21 DIAGNOSIS — R109 Unspecified abdominal pain: Secondary | ICD-10-CM | POA: Diagnosis not present

## 2021-02-21 DIAGNOSIS — K2100 Gastro-esophageal reflux disease with esophagitis, without bleeding: Secondary | ICD-10-CM

## 2021-02-21 DIAGNOSIS — D631 Anemia in chronic kidney disease: Secondary | ICD-10-CM | POA: Diagnosis not present

## 2021-02-21 DIAGNOSIS — N186 End stage renal disease: Secondary | ICD-10-CM | POA: Diagnosis not present

## 2021-02-21 DIAGNOSIS — I509 Heart failure, unspecified: Secondary | ICD-10-CM | POA: Diagnosis not present

## 2021-02-21 DIAGNOSIS — N2581 Secondary hyperparathyroidism of renal origin: Secondary | ICD-10-CM | POA: Diagnosis not present

## 2021-02-21 LAB — CBC
HCT: 24.5 % — ABNORMAL LOW (ref 39.0–52.0)
Hemoglobin: 8.4 g/dL — ABNORMAL LOW (ref 13.0–17.0)
MCH: 29.8 pg (ref 26.0–34.0)
MCHC: 34.3 g/dL (ref 30.0–36.0)
MCV: 86.9 fL (ref 80.0–100.0)
Platelets: 195 10*3/uL (ref 150–400)
RBC: 2.82 MIL/uL — ABNORMAL LOW (ref 4.22–5.81)
RDW: 14.5 % (ref 11.5–15.5)
WBC: 9.6 10*3/uL (ref 4.0–10.5)
nRBC: 0 % (ref 0.0–0.2)

## 2021-02-21 LAB — COMPREHENSIVE METABOLIC PANEL
ALT: 10 U/L (ref 0–44)
AST: 13 U/L — ABNORMAL LOW (ref 15–41)
Albumin: 2.5 g/dL — ABNORMAL LOW (ref 3.5–5.0)
Alkaline Phosphatase: 57 U/L (ref 38–126)
Anion gap: 17 — ABNORMAL HIGH (ref 5–15)
BUN: 49 mg/dL — ABNORMAL HIGH (ref 6–20)
CO2: 23 mmol/L (ref 22–32)
Calcium: 9.5 mg/dL (ref 8.9–10.3)
Chloride: 93 mmol/L — ABNORMAL LOW (ref 98–111)
Creatinine, Ser: 11.18 mg/dL — ABNORMAL HIGH (ref 0.61–1.24)
GFR, Estimated: 5 mL/min — ABNORMAL LOW (ref >60)
Glucose, Bld: 99 mg/dL (ref 70–99)
Potassium: 3.7 mmol/L (ref 3.5–5.1)
Sodium: 133 mmol/L — ABNORMAL LOW (ref 135–145)
Total Bilirubin: 0.2 mg/dL — ABNORMAL LOW (ref 0.3–1.2)
Total Protein: 6.5 g/dL (ref 6.5–8.1)

## 2021-02-21 LAB — SARS CORONAVIRUS 2 (TAT 6-24 HRS): SARS Coronavirus 2: NEGATIVE

## 2021-02-21 MED ORDER — MELATONIN 3 MG PO TABS: 4.5000 mg | ORAL_TABLET | Freq: Every day | ORAL | 0 refills | Status: AC

## 2021-02-21 MED ORDER — AMOXICILLIN-POT CLAVULANATE 500-125 MG PO TABS: 1.0000 | ORAL_TABLET | Freq: Two times a day (BID) | ORAL | 0 refills | Status: AC

## 2021-02-21 MED ORDER — SUCRALFATE 1 GM/10ML PO SUSP
1.0000 g | Freq: Three times a day (TID) | ORAL | Status: DC
  Administered 2021-02-21 (×2): 1 g via ORAL
  Filled 2021-02-21 (×2): qty 10

## 2021-02-21 MED ORDER — FAMOTIDINE 10 MG PO TABS: 10.0000 mg | ORAL_TABLET | Freq: Every day | ORAL | 2 refills | Status: AC

## 2021-02-21 MED ORDER — SUCRALFATE 1 GM/10ML PO SUSP: 1.0000 g | Freq: Three times a day (TID) | ORAL | 0 refills | Status: AC

## 2021-02-21 MED ORDER — LINACLOTIDE 145 MCG PO CAPS: 145.0000 ug | ORAL_CAPSULE | Freq: Every day | ORAL | Status: AC

## 2021-02-21 MED ORDER — PANTOPRAZOLE SODIUM 40 MG PO TBEC: 40.0000 mg | DELAYED_RELEASE_TABLET | Freq: Two times a day (BID) | ORAL | 2 refills | Status: AC

## 2021-02-21 NOTE — Discharge Summary (Signed)
Physician Discharge Summary  Antonio Powell DZH:[id number] DOB: 01-Apr-1976 DOA: 02/20/2021  PCP: [facility]  Admit date: 02/20/2021 Discharge date: 02/21/2021  Time spent: 35 minutes  Recommendations for Outpatient Follow-up:  Reassess resolution of patient's symptoms with adjustment of PPI therapy. Follow final results of CMV and adjust treatment as needed -Outpatient follow-up with gastroenterology service as he may end up requiring colonoscopy evaluation in 6 to 8 weeks.  Discharge Diagnoses:  Active Problems:   Heart transplant recipient [facility])   ESRD (end stage renal disease) ([place])   Colitis   Abdominal pain   Gastroesophageal reflux disease with esophagitis without hemorrhage   Discharge Condition: Stable and improved.  No nausea, no vomiting, tolerating diet and expressing almost complete resolution of abdominal discomfort.  Tolerating oral antibiotics without any issues.  CODE STATUS: Full code  Diet recommendation: Low-sodium diet.  Filed Weights   02/20/21 1320 02/21/21 1330  Weight: 59 kg 58 kg    History of present illness:  As per H&P written by Dr. Waldron Labs on 02/20/2021  Antonio Powell  is a 45 y.o. male with past medical history of heart transplant in 2001 on immunosuppressant, ESRD on hemodialysis TTS, hypertension, hyperlipidemia. -Patient presents due to complaints of abdominal pain, nausea and vomiting, and diarrhea, he reports complains of a chronic abdominal pain, chronic diarrhea, reports he is compliant with his immunosuppressive therapy, he presents to ED secondary to worsening abdominal pain, reports in the upper abdomen, as well he does report diarrhea, with no change, denies any bright red blood per rectum, reports heartburn with no improvement with Nexium, patient with recent prolonged hospitalization in January at [facility] where he required intubation, and recently had follow-up EGD which was significant for esophagitis and  small stomach mass which was resected with biopsy significant only for hyperplasia which is benign. -In ED the blood work demonstrated hemoglobin at 11, white blood cell count of 11.8, troponins 145>> 135, the abdomen pelvis significant for stomach findings suspicious of gastritis versus ulcer, as well he had evidence of colitis.  Hospital Course:  Acute on chronic abdominal pain -In the setting of acute colitis, gastroesophageal reflux disease and esophagitis -Patient has been started on Protonix 40 mg twice a day as long Carafate for improvement in his gastritis and esophagitis component. -Also found with concerns of infectious colitis has been started on oral Augmentin to complete antibiotic therapy.  (MOD at time of discharge). -Advised to continue good hydration and nutrition -Outpatient follow-up with gastroenterology service.  End-stage renal disease -Appreciate nephrology service consultation -Status post hemodialysis 02/21/2021  Hypertension -Continue home cardiac medications regimen -Vital signs stable.  Hyperlipidemia -Continue statins.  History of heart transplant -Continue home immunosuppressive therapy and the steroids. -Patient denies chest pain -Fibrillation in his troponin in the setting of end-stage renal disease -No acute ischemic changes on telemetry or EKG -Continue patient follow-up with transplant unit.  Anticipated disease -Continue adjusted dose of PPI and Carafate as mentioned above. -Left eye modification discussed with patient.    Procedures:  See below for x-ray reports  Consultations:  Nephrology service.  Discharge Exam: Vitals:   02/21/21 1500 02/21/21 1530  BP: 96/66 96/67  Pulse: 96 97  Resp: 16 16  Temp:    SpO2:      General: No chest pain, reports no nausea or vomiting.  Significant improvement in his abdominal discomfort has been expressed.  Feeling ready to go home. Cardiovascular: Rate controlled, no rubs, no gallops, no JVD  on  exam. Respiratory: Good air movement bilaterally, no wheezing or crackles. Abdomen: Soft, nontender, positive bowel sounds Extremities: No cyanosis or clubbing.  Discharge Instructions   Discharge Instructions    Diet - low sodium heart healthy   Complete by: As directed    Discharge instructions   Complete by: As directed    Take medications as prescribed Maintain adequate hydration Follow-up with transplant unit in 1 week Follow up with PCP in 10 days Follow heart healthy/low-sodium diet Be compliant with your hemodialysis sessions.     Allergies as of 02/21/2021      Reactions   Nsaids    Cant take due to heart transplant   Tolmetin Other (See Comments)   Cant take due to heart transplant   Varenicline Other (See Comments)   Vivid dreams   Phenergan [promethazine Hcl] Anxiety      Medication List    STOP taking these medications   dicyclomine 10 MG capsule Commonly known as: BENTYL   diltiazem 360 MG 24 hr capsule Commonly known as: TIAZAC   doxycycline 100 MG tablet Commonly known as: VIBRA-TABS   esomeprazole 20 MG packet Commonly known as: NEXIUM   furosemide 20 MG tablet Commonly known as: LASIX   ranitidine 150 MG tablet Commonly known as: ZANTAC Replaced by: famotidine 10 MG tablet   traMADol 50 MG tablet Commonly known as: ULTRAM     TAKE these medications   acetaminophen 500 MG tablet Commonly known as: TYLENOL Take 500 mg by mouth every 4 (four) hours as needed for moderate pain.   allopurinol 100 MG tablet Commonly known as: ZYLOPRIM Take 100 mg by mouth daily.   amitriptyline 10 MG tablet Commonly known as: ELAVIL Take 10 mg by mouth at bedtime.   amoxicillin-clavulanate 500-125 MG tablet Commonly known as: Augmentin Take 1 tablet (500 mg total) by mouth in the morning and at bedtime for 8 days.   aspirin 81 MG chewable tablet Chew 81 mg by mouth daily.   calcitRIOL 0.5 MCG capsule Commonly known as: ROCALTROL Resume As  per outpatient nephrology.   calcium acetate 667 MG capsule Commonly known as: PHOSLO Take 2,001 mg by mouth 3 (three) times daily with meals. 2001 mg with meals and 1334 with snacks   carvedilol 6.25 MG tablet Commonly known as: COREG Take 6.25 mg by mouth 2 (two) times daily.   colestipol 1 g tablet Commonly known as: COLESTID Take 1 tablet by mouth daily.   cyclobenzaprine 10 MG tablet Commonly known as: FLEXERIL Take 10 mg by mouth 2 (two) times daily.   diltiazem 360 MG 24 hr capsule Commonly known as: CARDIZEM CD Take 360 mg by mouth in the morning and at bedtime.   famotidine 10 MG tablet Commonly known as: PEPCID Take 1 tablet (10 mg total) by mouth at bedtime. Replaces: ranitidine 366 MG tablet   folic acid 1 MG tablet Commonly known as: FOLVITE Take 1 mg by mouth daily.   gabapentin 100 MG capsule Commonly known as: NEURONTIN Take 100 mg by mouth 3 (three) times daily.   hydrALAZINE 50 MG tablet Commonly known as: APRESOLINE Take 50 mg by mouth in the morning and at bedtime.   hyoscyamine 0.125 MG Tbdp disintergrating tablet Commonly known as: ANASPAZ Place 0.125 mg under the tongue every 6 (six) hours as needed for cramping.   imiquimod 5 % cream Commonly known as: ALDARA Apply to wart three times weekly for 16 weeks   iron polysaccharides 150 MG capsule Commonly known  as: NIFEREX Take 150 mg by mouth daily.   linaclotide 145 MCG Caps capsule Commonly known as: LINZESS Take 1 capsule (145 mcg total) by mouth daily before breakfast. Hold for diarrhea What changed: additional instructions   loperamide 2 MG capsule Commonly known as: IMODIUM Take 2-4 mg by mouth every 6 (six) hours as needed.   magnesium oxide 400 MG tablet Commonly known as: MAG-OX Take 400 mg by mouth daily.   melatonin 3 MG Tabs tablet Take 1.5 tablets (4.5 mg total) by mouth at bedtime. What changed: how much to take   Multivitamin Adult Tabs Take 1 tablet by mouth  daily.   multivitamin Tabs tablet Take 1 tablet by mouth daily.   mycophenolate 250 MG capsule Commonly known as: CELLCEPT Take 500 mg by mouth 2 (two) times daily.   ondansetron 4 MG disintegrating tablet Commonly known as: ZOFRAN-ODT Take 4 mg by mouth every 8 (eight) hours as needed.   ondansetron 4 MG tablet Commonly known as: ZOFRAN Take 4 mg by mouth every 8 (eight) hours as needed for nausea or vomiting.   pantoprazole 40 MG tablet Commonly known as: PROTONIX Take 1 tablet (40 mg total) by mouth 2 (two) times daily. What changed:   medication strength  how much to take  when to take this   pravastatin 40 MG tablet Commonly known as: PRAVACHOL Take 40 mg by mouth daily.   predniSONE 5 MG tablet Commonly known as: DELTASONE Take 5 mg by mouth daily with breakfast.   sirolimus 1 MG tablet Commonly known as: RAPAMUNE Take 1 mg by mouth daily.   sucralfate 1 GM/10ML suspension Commonly known as: CARAFATE Take 10 mLs (1 g total) by mouth 4 (four) times daily -  with meals and at bedtime for 14 days.   tacrolimus 1 MG capsule Commonly known as: PROGRAF Take 6 mg by mouth at bedtime. Take 5 mg every morning   tacrolimus 5 MG capsule Commonly known as: PROGRAF Take 5 mg by mouth daily.      Allergies  Allergen Reactions  . Nsaids     Cant take due to heart transplant  . Tolmetin Other (See Comments)    Cant take due to heart transplant  . Varenicline Other (See Comments)    Vivid dreams  . Phenergan [Promethazine Hcl] Anxiety    Follow-up Information    [facility]. Schedule an appointment as soon as possible for a visit in 10 day(s).   Specialty: Internal Medicine Contact information: [place] [address] [phone]                The results of significant diagnostics from this hospitalization (including imaging, microbiology, ancillary and laboratory) are listed below for reference.    Significant Diagnostic  Studies: DG Chest 2 View  Result Date: 02/20/2021 CLINICAL DATA:  Intermittent chest pain, heartburn, history of heart transplant EXAM: CHEST - 2 VIEW COMPARISON:  11/24/2020 FINDINGS: Frontal and lateral views of the chest demonstrate postsurgical changes from cardiac transplant. Stable pacemaker leads. Resolution of the airspace disease in the lung bases on prior study, with persistent bibasilar interstitial prominence which may reflect scarring. No effusion or pneumothorax. No acute bony abnormalities. IMPRESSION: 1. Bibasilar interstitial prominence consistent with scarring. No acute airspace disease. 2. Postsurgical changes from cardiac transplant. Electronically Signed   By: Randa Ngo M.D.   On: 02/20/2021 15:02   CT ABDOMEN PELVIS W CONTRAST  Result Date: 02/20/2021 CLINICAL DATA:  Epigastric pain  and burning for 2 weeks, vomiting EXAM: CT ABDOMEN AND PELVIS WITH CONTRAST TECHNIQUE: Multidetector CT imaging of the abdomen and pelvis was performed using the standard protocol following bolus administration of intravenous contrast. CONTRAST:  164mL OMNIPAQUE IOHEXOL 300 MG/ML  SOLN COMPARISON:  02/23/2020 FINDINGS: Lower chest: Bibasilar emphysema and scarring. Postsurgical changes from previous cardiac transplant. Hepatobiliary: No focal liver abnormality is seen. Status post cholecystectomy. No biliary dilatation. Pancreas: Unremarkable. No pancreatic ductal dilatation or surrounding inflammatory changes. Spleen: Normal in size without focal abnormality. Adrenals/Urinary Tract: Marked bilateral renal cortical atrophy consistent with history of end-stage renal disease. The adrenals are unremarkable. Bladder is decompressed, limiting its evaluation. Stomach/Bowel: No bowel obstruction or ileus. There is segmental wall thickening of the rectosigmoid colon compatible with inflammatory or infectious colitis. Mural thickening of the gastric antrum/pylorus, nonspecific. Mild gastric distension.  Vascular/Lymphatic: Aortic atherosclerosis. No enlarged abdominal or pelvic lymph nodes. Reproductive: Prostate is unremarkable. Other: No free fluid or free gas.  No abdominal wall hernia. Musculoskeletal: No acute or destructive bony lesions. Diffuse changes of renal osteodystrophy. Reconstructed images demonstrate no additional findings. IMPRESSION: 1. Segmental wall thickening of the distal rectosigmoid colon, consistent with inflammatory or infectious colitis. 2. Mural thickening of the gastric antrum and pylorus, which could reflect gastritis or peptic ulcer disease. 3. Postsurgical changes from cardiac transplant. 4. Sequela of end-stage renal disease. 5.  Aortic Atherosclerosis (ICD10-I70.0). Electronically Signed   By: Randa Ngo M.D.   On: 02/20/2021 17:10    Microbiology: No results found for this or any previous visit (from the past 240 hour(s)).   Labs: Basic Metabolic Panel: Recent Labs  Lab 02/20/21 1526 02/21/21 0520  NA 135 133*  K 4.1 3.7  CL 91* 93*  CO2 26 23  GLUCOSE 94 99  BUN 46* 49*  CREATININE 10.42* 11.18*  CALCIUM 10.5* 9.5   Liver Function Tests: Recent Labs  Lab 02/20/21 1526 02/21/21 0520  AST 21 13*  ALT 11 10  ALKPHOS 77 57  BILITOT 0.4 0.2*  PROT 8.7* 6.5  ALBUMIN 3.4* 2.5*   Recent Labs  Lab 02/20/21 1526  LIPASE 27   CBC: Recent Labs  Lab 02/20/21 1526 02/21/21 0520  WBC 11.8* 9.6  NEUTROABS 7.9*  --   HGB 11.0* 8.4*  HCT 32.7* 24.5*  MCV 88.6 86.9  PLT 226 195    Signed:  Barton Dubois MD.  [facility] 02/21/2021, 3:59 PM

## 2021-02-21 NOTE — Care Plan (Addendum)
Pt given AVS at this time. Denies questions. IV removed. Educated about medications and how to take them at this time.   1816: pt off unit in stable condition via wheelchair.

## 2021-02-21 NOTE — Treatment Plan (Signed)
Patient states he did not want hydralazine, carvedilol, or diltiazem this morning as he does not take then prior to dialysis. Takes them when he gets home, pharmacy made aware and will retime daily medications.

## 2021-02-22 LAB — CMV IGM: CMV IgM: <30 AU/mL (ref 0.0–29.9)

## 2021-02-22 LAB — EPSTEIN-BARR VIRUS VCA, IGM: EBV VCA IgM: <36 U/mL (ref 0.0–35.9)

## 2021-02-24 DIAGNOSIS — N186 End stage renal disease: Secondary | ICD-10-CM | POA: Diagnosis not present

## 2021-02-24 DIAGNOSIS — Z992 Dependence on renal dialysis: Secondary | ICD-10-CM | POA: Diagnosis not present

## 2021-02-25 DIAGNOSIS — N186 End stage renal disease: Secondary | ICD-10-CM | POA: Diagnosis not present

## 2021-02-25 DIAGNOSIS — Z941 Heart transplant status: Secondary | ICD-10-CM | POA: Diagnosis not present

## 2021-02-25 DIAGNOSIS — Z8719 Personal history of other diseases of the digestive system: Secondary | ICD-10-CM | POA: Diagnosis not present

## 2021-02-25 DIAGNOSIS — L89612 Pressure ulcer of right heel, stage 2: Secondary | ICD-10-CM | POA: Diagnosis not present

## 2021-02-25 DIAGNOSIS — Z952 Presence of prosthetic heart valve: Secondary | ICD-10-CM | POA: Diagnosis not present

## 2021-02-25 DIAGNOSIS — I2510 Atherosclerotic heart disease of native coronary artery without angina pectoris: Secondary | ICD-10-CM | POA: Diagnosis not present

## 2021-02-25 DIAGNOSIS — E440 Moderate protein-calorie malnutrition: Secondary | ICD-10-CM | POA: Diagnosis not present

## 2021-02-25 DIAGNOSIS — J9601 Acute respiratory failure with hypoxia: Secondary | ICD-10-CM | POA: Diagnosis not present

## 2021-02-25 DIAGNOSIS — U071 COVID-19: Secondary | ICD-10-CM | POA: Diagnosis not present

## 2021-02-25 DIAGNOSIS — D62 Acute posthemorrhagic anemia: Secondary | ICD-10-CM | POA: Diagnosis not present

## 2021-02-25 DIAGNOSIS — L89892 Pressure ulcer of other site, stage 2: Secondary | ICD-10-CM | POA: Diagnosis not present

## 2021-02-25 DIAGNOSIS — Z992 Dependence on renal dialysis: Secondary | ICD-10-CM | POA: Diagnosis not present

## 2021-02-25 DIAGNOSIS — M6281 Muscle weakness (generalized): Secondary | ICD-10-CM | POA: Diagnosis not present

## 2021-02-25 DIAGNOSIS — I120 Hypertensive chronic kidney disease with stage 5 chronic kidney disease or end stage renal disease: Secondary | ICD-10-CM | POA: Diagnosis not present

## 2021-02-25 DIAGNOSIS — J1282 Pneumonia due to coronavirus disease 2019: Secondary | ICD-10-CM | POA: Diagnosis not present

## 2021-02-27 DIAGNOSIS — Z941 Heart transplant status: Secondary | ICD-10-CM | POA: Diagnosis not present

## 2021-02-27 DIAGNOSIS — I10 Essential (primary) hypertension: Secondary | ICD-10-CM | POA: Diagnosis not present

## 2021-02-27 DIAGNOSIS — Z299 Encounter for prophylactic measures, unspecified: Secondary | ICD-10-CM | POA: Diagnosis not present

## 2021-02-27 DIAGNOSIS — K2970 Gastritis, unspecified, without bleeding: Secondary | ICD-10-CM | POA: Diagnosis not present

## 2021-02-27 DIAGNOSIS — E7800 Pure hypercholesterolemia, unspecified: Secondary | ICD-10-CM | POA: Diagnosis not present

## 2021-02-28 DIAGNOSIS — Z992 Dependence on renal dialysis: Secondary | ICD-10-CM | POA: Diagnosis not present

## 2021-02-28 DIAGNOSIS — N186 End stage renal disease: Secondary | ICD-10-CM | POA: Diagnosis not present

## 2021-03-02 DIAGNOSIS — Z8719 Personal history of other diseases of the digestive system: Secondary | ICD-10-CM | POA: Diagnosis not present

## 2021-03-02 DIAGNOSIS — I2510 Atherosclerotic heart disease of native coronary artery without angina pectoris: Secondary | ICD-10-CM | POA: Diagnosis not present

## 2021-03-02 DIAGNOSIS — D62 Acute posthemorrhagic anemia: Secondary | ICD-10-CM | POA: Diagnosis not present

## 2021-03-02 DIAGNOSIS — L89892 Pressure ulcer of other site, stage 2: Secondary | ICD-10-CM | POA: Diagnosis not present

## 2021-03-02 DIAGNOSIS — Z941 Heart transplant status: Secondary | ICD-10-CM | POA: Diagnosis not present

## 2021-03-02 DIAGNOSIS — E440 Moderate protein-calorie malnutrition: Secondary | ICD-10-CM | POA: Diagnosis not present

## 2021-03-02 DIAGNOSIS — U071 COVID-19: Secondary | ICD-10-CM | POA: Diagnosis not present

## 2021-03-02 DIAGNOSIS — L89612 Pressure ulcer of right heel, stage 2: Secondary | ICD-10-CM | POA: Diagnosis not present

## 2021-03-02 DIAGNOSIS — J1282 Pneumonia due to coronavirus disease 2019: Secondary | ICD-10-CM | POA: Diagnosis not present

## 2021-03-02 DIAGNOSIS — J9601 Acute respiratory failure with hypoxia: Secondary | ICD-10-CM | POA: Diagnosis not present

## 2021-03-02 DIAGNOSIS — Z952 Presence of prosthetic heart valve: Secondary | ICD-10-CM | POA: Diagnosis not present

## 2021-03-02 DIAGNOSIS — Z992 Dependence on renal dialysis: Secondary | ICD-10-CM | POA: Diagnosis not present

## 2021-03-02 DIAGNOSIS — I120 Hypertensive chronic kidney disease with stage 5 chronic kidney disease or end stage renal disease: Secondary | ICD-10-CM | POA: Diagnosis not present

## 2021-03-02 DIAGNOSIS — N186 End stage renal disease: Secondary | ICD-10-CM | POA: Diagnosis not present

## 2021-03-02 DIAGNOSIS — M6281 Muscle weakness (generalized): Secondary | ICD-10-CM | POA: Diagnosis not present

## 2021-03-03 DIAGNOSIS — N186 End stage renal disease: Secondary | ICD-10-CM | POA: Diagnosis not present

## 2021-03-03 DIAGNOSIS — Z992 Dependence on renal dialysis: Secondary | ICD-10-CM | POA: Diagnosis not present

## 2021-03-03 DIAGNOSIS — D509 Iron deficiency anemia, unspecified: Secondary | ICD-10-CM | POA: Diagnosis not present

## 2021-03-04 ENCOUNTER — Emergency Department (HOSPITAL_COMMUNITY): Payer: Medicare Other

## 2021-03-04 ENCOUNTER — Inpatient Hospital Stay (HOSPITAL_COMMUNITY)
Admission: EM | Admit: 2021-03-04 | Discharge: 2021-03-08 | DRG: 123 | Disposition: A | Discharge status: Other Acute Inpatient Hospital | Payer: Medicare Other | Attending: Family Medicine | Admitting: Family Medicine

## 2021-03-04 ENCOUNTER — Other Ambulatory Visit: Payer: Self-pay

## 2021-03-04 ENCOUNTER — Encounter (HOSPITAL_COMMUNITY): Payer: Self-pay | Admitting: *Deleted

## 2021-03-04 DIAGNOSIS — E785 Hyperlipidemia, unspecified: Secondary | ICD-10-CM | POA: Diagnosis not present

## 2021-03-04 DIAGNOSIS — H3412 Central retinal artery occlusion, left eye: Secondary | ICD-10-CM

## 2021-03-04 DIAGNOSIS — T82897A Other specified complication of cardiac prosthetic devices, implants and grafts, initial encounter: Secondary | ICD-10-CM | POA: Insufficient documentation

## 2021-03-04 DIAGNOSIS — Z419 Encounter for procedure for purposes other than remedying health state, unspecified: Secondary | ICD-10-CM

## 2021-03-04 DIAGNOSIS — H5462 Unqualified visual loss, left eye, normal vision right eye: Secondary | ICD-10-CM | POA: Diagnosis not present

## 2021-03-04 DIAGNOSIS — E440 Moderate protein-calorie malnutrition: Secondary | ICD-10-CM | POA: Diagnosis not present

## 2021-03-04 DIAGNOSIS — K2100 Gastro-esophageal reflux disease with esophagitis, without bleeding: Secondary | ICD-10-CM | POA: Diagnosis present

## 2021-03-04 DIAGNOSIS — D631 Anemia in chronic kidney disease: Secondary | ICD-10-CM | POA: Diagnosis not present

## 2021-03-04 DIAGNOSIS — N186 End stage renal disease: Secondary | ICD-10-CM | POA: Diagnosis not present

## 2021-03-04 DIAGNOSIS — I330 Acute and subacute infective endocarditis: Secondary | ICD-10-CM | POA: Diagnosis not present

## 2021-03-04 DIAGNOSIS — H53132 Sudden visual loss, left eye: Secondary | ICD-10-CM | POA: Diagnosis not present

## 2021-03-04 DIAGNOSIS — Z9049 Acquired absence of other specified parts of digestive tract: Secondary | ICD-10-CM

## 2021-03-04 DIAGNOSIS — Z7982 Long term (current) use of aspirin: Secondary | ICD-10-CM | POA: Diagnosis not present

## 2021-03-04 DIAGNOSIS — Z8249 Family history of ischemic heart disease and other diseases of the circulatory system: Secondary | ICD-10-CM | POA: Diagnosis not present

## 2021-03-04 DIAGNOSIS — R29818 Other symptoms and signs involving the nervous system: Secondary | ICD-10-CM | POA: Diagnosis not present

## 2021-03-04 DIAGNOSIS — F17200 Nicotine dependence, unspecified, uncomplicated: Secondary | ICD-10-CM | POA: Diagnosis not present

## 2021-03-04 DIAGNOSIS — Z941 Heart transplant status: Secondary | ICD-10-CM

## 2021-03-04 DIAGNOSIS — Z950 Presence of cardiac pacemaker: Secondary | ICD-10-CM | POA: Diagnosis not present

## 2021-03-04 DIAGNOSIS — E875 Hyperkalemia: Secondary | ICD-10-CM | POA: Diagnosis not present

## 2021-03-04 DIAGNOSIS — F17210 Nicotine dependence, cigarettes, uncomplicated: Secondary | ICD-10-CM | POA: Diagnosis present

## 2021-03-04 DIAGNOSIS — I442 Atrioventricular block, complete: Secondary | ICD-10-CM | POA: Diagnosis not present

## 2021-03-04 DIAGNOSIS — Z681 Body mass index (BMI) 19 or less, adult: Secondary | ICD-10-CM

## 2021-03-04 DIAGNOSIS — H349 Unspecified retinal vascular occlusion: Secondary | ICD-10-CM | POA: Diagnosis not present

## 2021-03-04 DIAGNOSIS — I120 Hypertensive chronic kidney disease with stage 5 chronic kidney disease or end stage renal disease: Secondary | ICD-10-CM | POA: Diagnosis not present

## 2021-03-04 DIAGNOSIS — Z992 Dependence on renal dialysis: Secondary | ICD-10-CM | POA: Diagnosis not present

## 2021-03-04 DIAGNOSIS — M898X9 Other specified disorders of bone, unspecified site: Secondary | ICD-10-CM | POA: Diagnosis present

## 2021-03-04 DIAGNOSIS — Z20822 Contact with and (suspected) exposure to covid-19: Secondary | ICD-10-CM | POA: Diagnosis present

## 2021-03-04 DIAGNOSIS — Z8616 Personal history of COVID-19: Secondary | ICD-10-CM | POA: Diagnosis not present

## 2021-03-04 DIAGNOSIS — N250 Renal osteodystrophy: Secondary | ICD-10-CM | POA: Diagnosis not present

## 2021-03-04 DIAGNOSIS — I6389 Other cerebral infarction: Secondary | ICD-10-CM | POA: Diagnosis not present

## 2021-03-04 DIAGNOSIS — I38 Endocarditis, valve unspecified: Secondary | ICD-10-CM

## 2021-03-04 DIAGNOSIS — I10 Essential (primary) hypertension: Secondary | ICD-10-CM | POA: Diagnosis not present

## 2021-03-04 DIAGNOSIS — J432 Centrilobular emphysema: Secondary | ICD-10-CM | POA: Diagnosis present

## 2021-03-04 DIAGNOSIS — I639 Cerebral infarction, unspecified: Secondary | ICD-10-CM | POA: Insufficient documentation

## 2021-03-04 DIAGNOSIS — E1122 Type 2 diabetes mellitus with diabetic chronic kidney disease: Secondary | ICD-10-CM | POA: Diagnosis not present

## 2021-03-04 DIAGNOSIS — R931 Abnormal findings on diagnostic imaging of heart and coronary circulation: Secondary | ICD-10-CM | POA: Diagnosis not present

## 2021-03-04 DIAGNOSIS — I509 Heart failure, unspecified: Secondary | ICD-10-CM | POA: Diagnosis not present

## 2021-03-04 DIAGNOSIS — I400 Infective myocarditis: Secondary | ICD-10-CM | POA: Diagnosis present

## 2021-03-04 DIAGNOSIS — Z952 Presence of prosthetic heart valve: Secondary | ICD-10-CM

## 2021-03-04 DIAGNOSIS — Z888 Allergy status to other drugs, medicaments and biological substances status: Secondary | ICD-10-CM

## 2021-03-04 DIAGNOSIS — H5440 Blindness, one eye, unspecified eye: Secondary | ICD-10-CM | POA: Diagnosis not present

## 2021-03-04 DIAGNOSIS — Z7952 Long term (current) use of systemic steroids: Secondary | ICD-10-CM

## 2021-03-04 DIAGNOSIS — Z720 Tobacco use: Secondary | ICD-10-CM | POA: Diagnosis present

## 2021-03-04 DIAGNOSIS — I132 Hypertensive heart and chronic kidney disease with heart failure and with stage 5 chronic kidney disease, or end stage renal disease: Secondary | ICD-10-CM | POA: Diagnosis not present

## 2021-03-04 DIAGNOSIS — Z299 Encounter for prophylactic measures, unspecified: Secondary | ICD-10-CM | POA: Diagnosis not present

## 2021-03-04 DIAGNOSIS — Z886 Allergy status to analgesic agent status: Secondary | ICD-10-CM

## 2021-03-04 DIAGNOSIS — E1140 Type 2 diabetes mellitus with diabetic neuropathy, unspecified: Secondary | ICD-10-CM | POA: Diagnosis present

## 2021-03-04 DIAGNOSIS — I358 Other nonrheumatic aortic valve disorders: Secondary | ICD-10-CM | POA: Diagnosis present

## 2021-03-04 DIAGNOSIS — H34231 Retinal artery branch occlusion, right eye: Secondary | ICD-10-CM | POA: Diagnosis not present

## 2021-03-04 DIAGNOSIS — Z79899 Other long term (current) drug therapy: Secondary | ICD-10-CM

## 2021-03-04 LAB — DIFFERENTIAL
Abs Immature Granulocytes: 0.04 10*3/uL (ref 0.00–0.07)
Basophils Absolute: 0 10*3/uL (ref 0.0–0.1)
Basophils Relative: 0 %
Eosinophils Absolute: 0.2 10*3/uL (ref 0.0–0.5)
Eosinophils Relative: 2 %
Immature Granulocytes: 0 %
Lymphocytes Relative: 21 %
Lymphs Abs: 1.9 10*3/uL (ref 0.7–4.0)
Monocytes Absolute: 0.8 10*3/uL (ref 0.1–1.0)
Monocytes Relative: 9 %
Neutro Abs: 6.3 10*3/uL (ref 1.7–7.7)
Neutrophils Relative %: 68 %

## 2021-03-04 LAB — COMPREHENSIVE METABOLIC PANEL
ALT: 17 U/L (ref 0–44)
AST: 13 U/L — ABNORMAL LOW (ref 15–41)
Albumin: 2.8 g/dL — ABNORMAL LOW (ref 3.5–5.0)
Alkaline Phosphatase: 59 U/L (ref 38–126)
Anion gap: 14 (ref 5–15)
BUN: 35 mg/dL — ABNORMAL HIGH (ref 6–20)
CO2: 25 mmol/L (ref 22–32)
Calcium: 9.6 mg/dL (ref 8.9–10.3)
Chloride: 95 mmol/L — ABNORMAL LOW (ref 98–111)
Creatinine, Ser: 9.59 mg/dL — ABNORMAL HIGH (ref 0.61–1.24)
GFR, Estimated: 6 mL/min — ABNORMAL LOW (ref >60)
Glucose, Bld: 97 mg/dL (ref 70–99)
Potassium: 4.3 mmol/L (ref 3.5–5.1)
Sodium: 134 mmol/L — ABNORMAL LOW (ref 135–145)
Total Bilirubin: 0.4 mg/dL (ref 0.3–1.2)
Total Protein: 7.3 g/dL (ref 6.5–8.1)

## 2021-03-04 LAB — CBC
HCT: 25.5 % — ABNORMAL LOW (ref 39.0–52.0)
Hemoglobin: 8.8 g/dL — ABNORMAL LOW (ref 13.0–17.0)
MCH: 30 pg (ref 26.0–34.0)
MCHC: 34.5 g/dL (ref 30.0–36.0)
MCV: 87 fL (ref 80.0–100.0)
Platelets: 156 10*3/uL (ref 150–400)
RBC: 2.93 MIL/uL — ABNORMAL LOW (ref 4.22–5.81)
RDW: 15.1 % (ref 11.5–15.5)
WBC: 9.3 10*3/uL (ref 4.0–10.5)
nRBC: 0 % (ref 0.0–0.2)

## 2021-03-04 LAB — APTT: aPTT: 35 seconds (ref 24–36)

## 2021-03-04 LAB — ETHANOL: Alcohol, Ethyl (B): <10 mg/dL (ref <10)

## 2021-03-04 LAB — PROTIME-INR
INR: 1.1 (ref 0.8–1.2)
Prothrombin Time: 14.2 seconds (ref 11.4–15.2)

## 2021-03-04 LAB — RESP PANEL BY RT-PCR (FLU A&B, COVID) ARPGX2
Influenza A by PCR: NEGATIVE
Influenza B by PCR: NEGATIVE
SARS Coronavirus 2 by RT PCR: NEGATIVE

## 2021-03-04 MED ORDER — MYCOPHENOLATE MOFETIL 250 MG PO CAPS
500.0000 mg | ORAL_CAPSULE | Freq: Two times a day (BID) | ORAL | Status: DC
  Administered 2021-03-05 – 2021-03-08 (×8): 500 mg via ORAL
  Filled 2021-03-04 (×8): qty 2

## 2021-03-04 MED ORDER — CARVEDILOL 6.25 MG PO TABS
6.2500 mg | ORAL_TABLET | Freq: Two times a day (BID) | ORAL | Status: DC
  Administered 2021-03-05 – 2021-03-08 (×7): 6.25 mg via ORAL
  Filled 2021-03-04 (×7): qty 1

## 2021-03-04 MED ORDER — CYCLOBENZAPRINE HCL 10 MG PO TABS
10.0000 mg | ORAL_TABLET | Freq: Three times a day (TID) | ORAL | Status: DC | PRN
  Administered 2021-03-06 – 2021-03-07 (×3): 10 mg via ORAL
  Filled 2021-03-04 (×3): qty 1

## 2021-03-04 MED ORDER — TACROLIMUS 1 MG PO CAPS
6.0000 mg | ORAL_CAPSULE | Freq: Every day | ORAL | Status: DC
  Administered 2021-03-05 – 2021-03-07 (×4): 6 mg via ORAL
  Filled 2021-03-04 (×4): qty 6

## 2021-03-04 MED ORDER — AMITRIPTYLINE HCL 10 MG PO TABS
10.0000 mg | ORAL_TABLET | Freq: Every day | ORAL | Status: DC
  Administered 2021-03-05 – 2021-03-07 (×4): 10 mg via ORAL
  Filled 2021-03-04 (×5): qty 1

## 2021-03-04 MED ORDER — ASPIRIN 81 MG PO CHEW
81.0000 mg | CHEWABLE_TABLET | Freq: Every day | ORAL | Status: DC
  Administered 2021-03-05: 81 mg via ORAL
  Filled 2021-03-04: qty 1

## 2021-03-04 MED ORDER — PRAVASTATIN SODIUM 40 MG PO TABS
40.0000 mg | ORAL_TABLET | Freq: Every day | ORAL | Status: DC
  Administered 2021-03-05 – 2021-03-07 (×3): 40 mg via ORAL
  Filled 2021-03-04 (×4): qty 1

## 2021-03-04 MED ORDER — COLESTIPOL HCL 1 G PO TABS
1.0000 g | ORAL_TABLET | Freq: Every day | ORAL | Status: DC
  Administered 2021-03-05 – 2021-03-08 (×4): 1 g via ORAL
  Filled 2021-03-04 (×4): qty 1

## 2021-03-04 MED ORDER — SIROLIMUS 1 MG PO TABS
1.0000 mg | ORAL_TABLET | Freq: Every day | ORAL | Status: DC
  Administered 2021-03-05 – 2021-03-08 (×4): 1 mg via ORAL
  Filled 2021-03-04 (×4): qty 1

## 2021-03-04 MED ORDER — TACROLIMUS 1 MG PO CAPS
5.0000 mg | ORAL_CAPSULE | Freq: Every day | ORAL | Status: DC
  Administered 2021-03-05 – 2021-03-08 (×4): 5 mg via ORAL
  Filled 2021-03-04 (×5): qty 5

## 2021-03-04 MED ORDER — ONDANSETRON HCL 4 MG/2ML IJ SOLN: 4.0000 mg | Freq: Four times a day (QID) | INTRAMUSCULAR | Status: DC | PRN

## 2021-03-04 MED ORDER — OXYCODONE HCL 5 MG PO TABS
5.0000 mg | ORAL_TABLET | Freq: Four times a day (QID) | ORAL | Status: DC | PRN
  Administered 2021-03-05 – 2021-03-08 (×11): 5 mg via ORAL
  Filled 2021-03-04 (×12): qty 1

## 2021-03-04 MED ORDER — STROKE: EARLY STAGES OF RECOVERY BOOK
Freq: Once | Status: AC
  Filled 2021-03-04: qty 1

## 2021-03-04 MED ORDER — IOHEXOL 350 MG/ML SOLN
75.0000 mL | Freq: Once | INTRAVENOUS | Status: AC | PRN
  Administered 2021-03-04: 75 mL via INTRAVENOUS

## 2021-03-04 MED ORDER — LOPERAMIDE HCL 2 MG PO CAPS: 2.0000 mg | ORAL_CAPSULE | ORAL | Status: DC | PRN

## 2021-03-04 MED ORDER — CALCIUM ACETATE (PHOS BINDER) 667 MG PO CAPS: 1334.0000 mg | ORAL_CAPSULE | ORAL | Status: DC | PRN

## 2021-03-04 MED ORDER — GABAPENTIN 100 MG PO CAPS
100.0000 mg | ORAL_CAPSULE | Freq: Three times a day (TID) | ORAL | Status: DC
  Administered 2021-03-05 – 2021-03-08 (×10): 100 mg via ORAL
  Filled 2021-03-04 (×10): qty 1

## 2021-03-04 MED ORDER — OXYCODONE HCL 5 MG PO TABS
10.0000 mg | ORAL_TABLET | Freq: Once | ORAL | Status: AC
  Administered 2021-03-04: 10 mg via ORAL
  Filled 2021-03-04: qty 2

## 2021-03-04 MED ORDER — NICOTINE 14 MG/24HR TD PT24
14.0000 mg | MEDICATED_PATCH | Freq: Every day | TRANSDERMAL | Status: DC
  Administered 2021-03-05 – 2021-03-08 (×5): 14 mg via TRANSDERMAL
  Filled 2021-03-04 (×5): qty 1

## 2021-03-04 MED ORDER — PANTOPRAZOLE SODIUM 40 MG PO TBEC
40.0000 mg | DELAYED_RELEASE_TABLET | Freq: Two times a day (BID) | ORAL | Status: DC
  Administered 2021-03-05 – 2021-03-08 (×8): 40 mg via ORAL
  Filled 2021-03-04 (×8): qty 1

## 2021-03-04 MED ORDER — ACETAMINOPHEN 650 MG RE SUPP: 650.0000 mg | RECTAL | Status: DC | PRN

## 2021-03-04 MED ORDER — CALCIUM ACETATE (PHOS BINDER) 667 MG PO CAPS
2001.0000 mg | ORAL_CAPSULE | Freq: Three times a day (TID) | ORAL | Status: DC
  Administered 2021-03-05 – 2021-03-08 (×10): 2001 mg via ORAL
  Filled 2021-03-04 (×10): qty 3

## 2021-03-04 MED ORDER — HYDRALAZINE HCL 50 MG PO TABS: 50.0000 mg | ORAL_TABLET | Freq: Three times a day (TID) | ORAL | Status: DC | PRN

## 2021-03-04 MED ORDER — RENA-VITE PO TABS
1.0000 | ORAL_TABLET | Freq: Every day | ORAL | Status: DC
  Administered 2021-03-05 – 2021-03-08 (×4): 1 via ORAL
  Filled 2021-03-04 (×4): qty 1

## 2021-03-04 MED ORDER — FAMOTIDINE 20 MG PO TABS
10.0000 mg | ORAL_TABLET | Freq: Every day | ORAL | Status: DC
  Administered 2021-03-05 – 2021-03-07 (×4): 10 mg via ORAL
  Filled 2021-03-04 (×4): qty 1

## 2021-03-04 MED ORDER — ASPIRIN EC 325 MG PO TBEC
325.0000 mg | DELAYED_RELEASE_TABLET | Freq: Once | ORAL | Status: AC
  Administered 2021-03-04: 325 mg via ORAL
  Filled 2021-03-04: qty 1

## 2021-03-04 MED ORDER — MELATONIN 3 MG PO TABS
4.5000 mg | ORAL_TABLET | Freq: Every day | ORAL | Status: DC
  Administered 2021-03-05 – 2021-03-07 (×4): 4.5 mg via ORAL
  Filled 2021-03-04 (×4): qty 2

## 2021-03-04 MED ORDER — ACETAMINOPHEN 160 MG/5ML PO SOLN: 650.0000 mg | ORAL | Status: DC | PRN

## 2021-03-04 MED ORDER — ACETAMINOPHEN 325 MG PO TABS
650.0000 mg | ORAL_TABLET | ORAL | Status: DC | PRN
  Administered 2021-03-07: 650 mg via ORAL
  Filled 2021-03-04: qty 2

## 2021-03-04 MED ORDER — DILTIAZEM HCL ER COATED BEADS 180 MG PO CP24
360.0000 mg | ORAL_CAPSULE | Freq: Every day | ORAL | Status: DC
  Administered 2021-03-05 – 2021-03-08 (×4): 360 mg via ORAL
  Filled 2021-03-04 (×4): qty 2

## 2021-03-04 NOTE — ED Provider Notes (Signed)
[facility] EMERGENCY DEPARTMENT Provider Note   CSN: [id number] Arrival date & time: 03/04/21  1351     History Chief Complaint  Patient presents with  . Loss of Vision    Antonio Powell is a 45 y.o. male.  HPI 3:02 PM- Vision loss left eye for 6 days. Saw PCP, then "[facility]" today, was sent here. Given eye drops by Eye doctor.  Denies headache, eye pain, weakness, dizziness.  He dialyzed yesterday.  He is taking his usual medications.     Past Medical History:  Diagnosis Date  . Heart transplant recipient [facility])   . Hypertension     Patient Active Problem List   Diagnosis Date Noted  . Abdominal pain   . Gastroesophageal reflux disease with esophagitis without hemorrhage   . Heart transplant recipient [facility]) 02/20/2021  . ESRD (end stage renal disease) ([place]) 02/20/2021  . Colitis 02/20/2021  . Acute anemia 01/16/2021    Past Surgical History:  Procedure Laterality Date  . APPENDECTOMY    . CARDIAC SURGERY    . CHOLECYSTECTOMY    . HERNIA REPAIR    . left ear         History reviewed. No pertinent family history.  Social History   Tobacco Use  . Smoking status: Current Every Day Smoker    Packs/day: 0.50    Types: Cigarettes  . Smokeless tobacco: Never Used  . Tobacco comment: black and mild  Vaping Use  . Vaping Use: Never used  Substance Use Topics  . Alcohol use: Yes    Comment: occ  . Drug use: Yes    Frequency: 2.0 times per week    Types: Marijuana    Home Medications Prior to Admission medications   Medication Sig Start Date End Date Taking? Authorizing Provider  acetaminophen (TYLENOL) 500 MG tablet Take 500 mg by mouth every 4 (four) hours as needed for moderate pain.   Yes [provider]  allopurinol (ZYLOPRIM) 100 MG tablet Take 100 mg by mouth daily.   Yes [provider]  amitriptyline (ELAVIL) 10 MG tablet Take 10 mg by mouth at bedtime. 01/12/21  Yes [provider]  aspirin 81 MG chewable tablet  Chew 81 mg by mouth daily.   Yes [provider]  calcium acetate (PHOSLO) 667 MG capsule Take 2,001 mg by mouth 3 (three) times daily with meals. 2001 mg with meals and 1334 with snacks 06/10/20  Yes [provider]  carvedilol (COREG) 6.25 MG tablet Take 6.25 mg by mouth 2 (two) times daily.    Yes [provider]  colestipol (COLESTID) 1 g tablet Take 1 tablet by mouth daily. 09/16/20  Yes [provider]  cyclobenzaprine (FLEXERIL) 10 MG tablet Take 10 mg by mouth 2 (two) times daily. 10/22/20  Yes [provider]  diltiazem (CARDIZEM CD) 360 MG 24 hr capsule Take 360 mg by mouth in the morning and at bedtime. 01/12/21  Yes [provider]  famotidine (PEPCID) 10 MG tablet Take 1 tablet (10 mg total) by mouth at bedtime. 02/21/21  Yes Barton Dubois, MD  folic acid (FOLVITE) 1 MG tablet Take 1 mg by mouth daily.   Yes [provider]  gabapentin (NEURONTIN) 100 MG capsule Take 100 mg by mouth 3 (three) times daily. 11/03/20  Yes [provider]  hydrALAZINE (APRESOLINE) 50 MG tablet Take 50 mg by mouth in the morning and at bedtime.   Yes [provider]  hyoscyamine (ANASPAZ) 0.125 MG  TBDP disintergrating tablet Place 0.125 mg under the tongue every 6 (six) hours as needed for cramping. 02/12/20 03/04/21 Yes [provider]  imiquimod (ALDARA) 5 % cream Apply 1 application topically 3 (three) times a week. 10/06/20  Yes [provider]  linaclotide Rolan Lipa) 145 MCG CAPS capsule Take 1 capsule (145 mcg total) by mouth daily before breakfast. Hold for diarrhea 02/21/21  Yes Barton Dubois, MD  loperamide (IMODIUM) 2 MG capsule Take 2-4 mg by mouth every 6 (six) hours as needed. 11/11/20  Yes [provider]  magnesium oxide (MAG-OX) 400 MG tablet Take 400 mg by mouth daily.   Yes [provider]  melatonin 3 MG TABS tablet Take 1.5 tablets (4.5 mg total) by mouth at bedtime. 02/21/21 12/13/22 Yes  Barton Dubois, MD  mycophenolate (CELLCEPT) 250 MG capsule Take 500 mg by mouth 2 (two) times daily. 12/20/20 01/12/22 Yes [provider]  ondansetron (ZOFRAN) 4 MG tablet Take 4 mg by mouth every 8 (eight) hours as needed for nausea or vomiting.   Yes [provider]  ondansetron (ZOFRAN-ODT) 4 MG disintegrating tablet Take 4 mg by mouth every 8 (eight) hours as needed. 02/19/21  Yes [provider]  pantoprazole (PROTONIX) 40 MG tablet Take 1 tablet (40 mg total) by mouth 2 (two) times daily. 02/21/21  Yes Barton Dubois, MD  pravastatin (PRAVACHOL) 40 MG tablet Take 40 mg by mouth daily.   Yes [provider]  predniSONE (DELTASONE) 5 MG tablet Take 5 mg by mouth daily with breakfast. 01/12/21  Yes [provider]  sirolimus (RAPAMUNE) 1 MG tablet Take 1 mg by mouth daily.   Yes [provider]  sucralfate (CARAFATE) 1 GM/10ML suspension Take 10 mLs (1 g total) by mouth 4 (four) times daily -  with meals and at bedtime for 14 days. 02/21/21 03/07/21 Yes Barton Dubois, MD  tacrolimus (PROGRAF) 1 MG capsule Take 6 mg by mouth at bedtime. Take 5 mg every morning   Yes [provider]  tacrolimus (PROGRAF) 5 MG capsule Take 5 mg by mouth daily. Take 5 mg every morning and 6 mg every evening   Yes [provider]  calcitRIOL (ROCALTROL) 0.5 MCG capsule Resume As per outpatient nephrology. Patient not taking: Reported on 02/20/2021 12/20/20   [provider]  iron polysaccharides (NIFEREX) 150 MG capsule Take 150 mg by mouth daily. Patient not taking: Reported on 02/20/2021    [provider]  Multiple Vitamins-Minerals (MULTIVITAMIN ADULT) TABS Take 1 tablet by mouth daily. Patient not taking: Reported on 02/20/2021    [provider]  multivitamin (RENA-VIT) TABS tablet Take 1 tablet by mouth daily. Patient not taking: Reported on 02/20/2021 08/17/19 08/29/21  [provider]    Allergies    Nsaids,  Tolmetin, Varenicline, and Phenergan [promethazine hcl]  Review of Systems   Review of Systems  Physical Exam Updated Vital Signs BP 96/71   Pulse (!) 101   Temp 98.8 F (37.1 C) (Oral)   Resp 19   Ht 5\' 11"  (1.803 m)   Wt 61.2 kg   SpO2 100%   BMI 18.83 kg/m   Physical Exam  ED Results / Procedures / Treatments   Labs (all labs ordered are listed, but only abnormal results are displayed) Labs Reviewed  CBC - Abnormal; Notable for the following components:      Result Value   RBC 2.93 (*)    Hemoglobin 8.8 (*)    HCT 25.5 (*)  All other components within normal limits  COMPREHENSIVE METABOLIC PANEL - Abnormal; Notable for the following components:   Sodium 134 (*)    Chloride 95 (*)    BUN 35 (*)    Creatinine, Ser 9.59 (*)    Albumin 2.8 (*)    AST 13 (*)    GFR, Estimated 6 (*)    All other components within normal limits  RESP PANEL BY RT-PCR (FLU A&B, COVID) ARPGX2  ETHANOL  PROTIME-INR  APTT  DIFFERENTIAL  RAPID URINE DRUG SCREEN, HOSP PERFORMED  URINALYSIS, ROUTINE W REFLEX MICROSCOPIC  I-STAT CHEM 8, ED    EKG EKG Interpretation  Date/Time:  Wednesday March 04 2021 16:15:22 EDT Ventricular Rate:  103 PR Interval:    QRS Duration: 134 QT Interval:  413 QTC Calculation: 541 R Axis:   -54 Text Interpretation: indeterminate rhythym RBBB and LAFB Left ventricular hypertrophy ST elevation, consider inferior injury Since last tracing Rate faster and p wave is not visible Confirmed by Daleen Bo [phone]) on 03/04/2021 5:30:25 PM   Radiology CT Angio Head W or Wo Contrast  Result Date: 03/04/2021 CLINICAL DATA:  Left eye vision loss for 5 days. EXAM: CT ANGIOGRAPHY HEAD AND NECK TECHNIQUE: Multidetector CT imaging of the head and neck was performed using the standard protocol during bolus administration of intravenous contrast. Multiplanar CT image reconstructions and MIPs were obtained to evaluate the vascular anatomy. Carotid stenosis measurements  (when applicable) are obtained utilizing NASCET criteria, using the distal internal carotid diameter as the denominator. CONTRAST:  56mL OMNIPAQUE IOHEXOL 350 MG/ML SOLN COMPARISON:  None. FINDINGS: CTA NECK FINDINGS Aortic arch: Normal variant aortic arch branching pattern with common origin of the brachiocephalic and left common carotid arteries. Right carotid system: Patent without evidence of stenosis, dissection, or significant atherosclerosis. Left carotid system: Patent without evidence of stenosis, dissection, or significant atherosclerosis. Vertebral arteries: Patent without evidence of stenosis, dissection, or significant atherosclerosis. Skeleton: Edentulous. Moderate multilevel disc space narrowing in the cervical spine. Other neck: No evidence of cervical lymphadenopathy or mass. Upper chest: Mild centrilobular emphysema in the lung apices with incompletely imaged asymmetric subpleural reticulation in the right upper lobe which may reflect scarring/mild fibrosis. Review of the MIP images confirms the above findings CTA HEAD FINDINGS Anterior circulation: The internal carotid arteries are widely patent from skull base to carotid termini. ACAs and MCAs are patent without evidence of a proximal branch occlusion or significant proximal stenosis. No aneurysm is identified. Posterior circulation: The intracranial vertebral arteries are widely patent to the basilar. Patent PICA and SCA origins are seen bilaterally. The basilar artery is widely patent. Posterior communicating arteries are diminutive or absent. Both PCAs are patent without evidence of a significant proximal stenosis. No aneurysm is identified. Venous sinuses: Patent. Anatomic variants: None. Review of the MIP images confirms the above findings IMPRESSION: 1. No large vessel occlusion, significant stenosis, or aneurysm in the head and neck. 2. Emphysema (ICD10-J43.9). Electronically Signed   By: Logan Bores M.D.   On: 03/04/2021 17:39   CT  Head Wo Contrast  Result Date: 03/04/2021 CLINICAL DATA:  Neuro deficit, acute, stroke suspected. Additional history provided: Vision loss in left eye 5 days. EXAM: CT HEAD WITHOUT CONTRAST TECHNIQUE: Contiguous axial images were obtained from the base of the skull through the vertex without intravenous contrast. COMPARISON:  Report from head CT 08/06/2007 (images currently unavailable). FINDINGS: Brain: Cerebral volume is normal for age. A small chronic infarct is questioned within the right cerebellar hemisphere (for  instance as seen on series 4, image 52). There is no acute intracranial hemorrhage. No demarcated cortical infarct. No extra-axial fluid collection. No evidence of intracranial mass. No midline shift. Partially empty sella turcica. Vascular: No hyperdense vessel. Skull: Normal. Negative for fracture or focal lesion. Sinuses/Orbits:a Visualized orbits show no acute finding. No significant paranasal sinus disease at the imaged levels. IMPRESSION: No evidence of acute intracranial abnormality. A small chronic infarct is questioned within the right cerebellar hemisphere. Electronically Signed   By: Kellie Simmering DO   On: 03/04/2021 15:42   CT Angio Neck W and/or Wo Contrast  Result Date: 03/04/2021 CLINICAL DATA:  Left eye vision loss for 5 days. EXAM: CT ANGIOGRAPHY HEAD AND NECK TECHNIQUE: Multidetector CT imaging of the head and neck was performed using the standard protocol during bolus administration of intravenous contrast. Multiplanar CT image reconstructions and MIPs were obtained to evaluate the vascular anatomy. Carotid stenosis measurements (when applicable) are obtained utilizing NASCET criteria, using the distal internal carotid diameter as the denominator. CONTRAST:  45mL OMNIPAQUE IOHEXOL 350 MG/ML SOLN COMPARISON:  None. FINDINGS: CTA NECK FINDINGS Aortic arch: Normal variant aortic arch branching pattern with common origin of the brachiocephalic and left common carotid arteries.  Right carotid system: Patent without evidence of stenosis, dissection, or significant atherosclerosis. Left carotid system: Patent without evidence of stenosis, dissection, or significant atherosclerosis. Vertebral arteries: Patent without evidence of stenosis, dissection, or significant atherosclerosis. Skeleton: Edentulous. Moderate multilevel disc space narrowing in the cervical spine. Other neck: No evidence of cervical lymphadenopathy or mass. Upper chest: Mild centrilobular emphysema in the lung apices with incompletely imaged asymmetric subpleural reticulation in the right upper lobe which may reflect scarring/mild fibrosis. Review of the MIP images confirms the above findings CTA HEAD FINDINGS Anterior circulation: The internal carotid arteries are widely patent from skull base to carotid termini. ACAs and MCAs are patent without evidence of a proximal branch occlusion or significant proximal stenosis. No aneurysm is identified. Posterior circulation: The intracranial vertebral arteries are widely patent to the basilar. Patent PICA and SCA origins are seen bilaterally. The basilar artery is widely patent. Posterior communicating arteries are diminutive or absent. Both PCAs are patent without evidence of a significant proximal stenosis. No aneurysm is identified. Venous sinuses: Patent. Anatomic variants: None. Review of the MIP images confirms the above findings IMPRESSION: 1. No large vessel occlusion, significant stenosis, or aneurysm in the head and neck. 2. Emphysema (ICD10-J43.9). Electronically Signed   By: Logan Bores M.D.   On: 03/04/2021 17:39    Procedures .Critical Care Performed by: Daleen Bo, MD Authorized by: Daleen Bo, MD   Critical care provider statement:    Critical care time (minutes):  50   Critical care start time:  03/04/2021 3:03 PM   Critical care end time:  03/04/2021 8:20 PM   Critical care time was exclusive of:  Separately billable procedures and treating  other patients   Critical care was necessary to treat or prevent imminent or life-threatening deterioration of the following conditions:  CNS failure or compromise   Critical care was time spent personally by me on the following activities:  Blood draw for specimens, development of treatment plan with patient or surrogate, discussions with consultants, evaluation of patient's response to treatment, examination of patient, obtaining history from patient or surrogate, ordering and performing treatments and interventions, ordering and review of laboratory studies, pulse oximetry, re-evaluation of patient's condition, review of old charts and ordering and review of radiographic studies  Medications Ordered in ED Medications  aspirin EC tablet 325 mg (325 mg Oral Given 03/04/21 1728)  iohexol (OMNIPAQUE) 350 MG/ML injection 75 mL (75 mLs Intravenous Contrast Given 03/04/21 1717)    ED Course  I have reviewed the triage vital signs and the nursing notes.  Pertinent labs & imaging results that were available during my care of the patient were reviewed by me and considered in my medical decision making (see chart for details).  Clinical Course as of 03/04/21 2020  Wed Mar 04, 2021  1605 I discussed case with telemetry neurologist, Dr. Leonel Ramsay.  He agrees with my findings and treatment plan.  He does not need to see this patient acutely for further evaluation at this time.  We will proceed with typical stroke work-up. [EW]    Clinical Course User Index [EW] Daleen Bo, MD   MDM Rules/Calculators/A&P                           Patient Vitals for the past 24 hrs:  BP Temp Temp src Pulse Resp SpO2 Height Weight  03/04/21 1900 96/71 -- -- (!) 101 19 100 % -- --  03/04/21 1830 110/80 -- -- -- (!) 22 -- -- --  03/04/21 1800 103/85 -- -- -- 14 100 % -- --  03/04/21 1730 104/82 -- -- -- (!) 21 -- -- --  03/04/21 1700 104/85 -- -- -- -- -- -- --  03/04/21 1600 106/83 -- -- (!) 101 (!) 22 100  % -- --  03/04/21 1545 -- -- -- (!) 101 (!) 22 100 % -- --  03/04/21 1530 110/89 -- -- -- 20 -- -- --  03/04/21 1515 -- -- -- (!) 102 18 100 % -- --  03/04/21 1500 110/72 -- -- (!) 102 11 100 % -- --  03/04/21 1445 -- -- -- (!) 104 (!) 22 100 % -- --  03/04/21 1439 -- 98.8 F (37.1 C) Oral -- -- -- -- --  03/04/21 1435 105/89 -- -- (!) 103 17 100 % -- --  03/04/21 1412 -- -- -- -- -- -- 5\' 11"  (1.803 m) 61.2 kg  03/04/21 1410 106/73 98.3 F (36.8 C) Oral (!) 110 16 93 % -- --    7:40 PM Reevaluation with update and discussion. After initial assessment and treatment, an updated evaluation reveals no change in clinical status, findings discussed with the patient and all questions were answered. Daleen Bo   Medical Decision Making:  This patient is presenting for evaluation of vision loss left eye, which does require a range of treatment options, and is a complaint that involves a high risk of morbidity and mortality. The differential diagnoses include CVA, retinal artery occlusion, intraocular disorder. I decided to review old records, and in summary dialysis patient presenting with vision loss for 6 days, isolated left eye with some preserved central vision.  I did not require additional historical information from anyone.  Clinical Laboratory Tests Ordered, included CBC, Metabolic panel, Urinalysis and Alcohol level, INR. Review indicates sodium low, chloride low, BUN high, creatinine high, albumin low, AST low, GFR low. Radiologic Tests Ordered, included normal except sodium low, chloride low, BUN high, creatinine high, albumin low, AST low, GFR low, hemoglobin low.  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  Patient cannot have a MRI because of pacemaker.  He also apparently has residual leads that are present from an old pacemaker.  He has multiple comorbidities, but is not acutely ill.  He dialyzed yesterday.  Aspirin started in the ED.  He needs hospitalization for further evaluation, including cardiac echo, neurology consultation.   CRITICAL CARE-yes Performed by: Daleen Bo  Nursing Notes Reviewed/ Care Coordinated Applicable Imaging Reviewed Interpretation of Laboratory Data incorporated into ED treatment  8:20 PM-Consult complete with hospitalist. Patient case explained and discussed.  She agrees to admit patient for further evaluation and treatment. Call ended at 8:19 PM  Plan: Admit  Final Clinical Impression(s) / ED Diagnoses Final diagnoses:  Retinal artery occlusion  End stage renal disease [facility])    Rx / DC Orders ED Discharge Orders    None       Daleen Bo, MD 03/04/21 2021

## 2021-03-04 NOTE — ED Notes (Addendum)
Pt states he went to his PCP today to be seen about his loss of vision to left eye. Pt was sent from PCP to the eye doctor. Per the patient, "The eye doctor wanted me to be seen for a possible stroke". EDP made aware.

## 2021-03-04 NOTE — ED Notes (Signed)
Unable to get MSE signature due to computer not working

## 2021-03-04 NOTE — ED Triage Notes (Addendum)
Pt with almost complete vision loss to left eye since Saturday night, occurred suddenly per pt.  Pt states he didn't have a ride til today to come to be seen.

## 2021-03-04 NOTE — ED Notes (Signed)
Pt states on his left eye he cannot see left side of visual field

## 2021-03-04 NOTE — Progress Notes (Signed)
Called regarding likely CRAO. Patient with unilateral vision loss and sent by Optho for CRAO.   Would admit for stroke workup including A1C, Lipids, echo, CTA head and neck, and MRI brain and can obtain  inpatient neurology consult.   Roland Rack, MD [facility] [phone]  If 7pm- 7am, please page neurology on call as listed in [place].

## 2021-03-04 NOTE — H&P (Signed)
TRH H&P    Patient Demographics:    Antonio Powell, is a 45 y.o. male  MRN: [id number]  DOB - Dec 31, 1975  Admit Date - 03/04/2021  Referring MD/NP/PA: Eulis Foster  Outpatient Primary MD for the patient is Medicine, [facility]  Patient coming from: Home  Chief complaint- Vision loss   HPI:    Antonio Powell  is a 45 y.o. male, with history of HTN, HLD, Heart transplant at age 34, ESRD with HD on T TH S, and more presents to the ED with a chief complaint of loss of vision in the left eye.  Patient reports that on 23 April he had sudden onset of bright light in his left eye and in the left eye going completely dark.  He had no pain to his eye or headache at that time.  He had no associated dysphagia, change in speech, asymmetric weakness or numbness, or change in coordination.  Patient does walk on his own at home and has had no difficulty with ambulation.  He reports that the site never came back in his left eye.  He had to wait till he can get a ride so today was the first day he was able to go see a provider.  He reports he saw his PCP who sent him to an ophthalmologist.  The ophthalmologist sent him to the ER.  Patient reports is never had anything like this before.  He does report that he has had full body aches starting today.  This is happened before and it was secondary to an ear infection.  He has taken Tylenol and muscle relaxers without relief.  He denies any fevers.  He reports any position changes make this pain worse.  Rest makes it better.  He request pain medication for this set of pains.  He is vaccinated for COVID, has not been exposed to anybody with COVID, has not had any infectious symptoms.  He was admitted for colitis about a week ago, but reports that his nausea vomiting has cleared up since then.  He does continue to have soft stools, but reports that that has been going on for 2 years.  Patient has  a history of heart transplant approximately 20 years ago, and heart valve replacements since then.  He reports compliance with his medication.  Patient is an ESRD patient -Tuesday Thursday Saturday schedule with left AV fistula.  Last dialysis was April 26, due for next dialysis April 28.  Dr. Joylene Grapes aware of nephro consult.  Patient does smoke black and milds, does not drink any alcohol, and uses marijuana 3-4 times per week.  Patient is vaccinated for COVID.  Patient is full code.  In the ED Temp 98.3, heart rate 101, respiratory rate 19, blood pressure 96/71, satting 100% No leukocytosis with a white blood cell count of 9.3, hemoglobin at baseline of 8.8, platelets 256 Chemistry panel is mostly unremarkable except BUN of 35, creatinine of 9.59 -dialysis patient CT head shows no acute intracranial abnormality.  Small chronic infarct right cerebellar hemisphere CTA head and  neck shows no large vessel occlusion, significant stenosis or aneurysm in the head or neck Dr. Leonel Ramsay was consulted and advised admission for stroke work-up including A1c, lipids, echo, CTA head and neck (done above), and MRI brain Patient does have a pacemaker and will be transferred to [facility] as we do not have an MRI compatible with pacemakers at [place] of systems:    In addition to the HPI above,  No Fever-chills, No Headache, Left sided vision loss No problems swallowing food or Liquids, No Chest pain, Cough or Shortness of Breath, No Abdominal pain, No Nausea or Vomiting, bowel movements are regular, No Blood in stool  No new skin rashes or bruises,  No new weakness, tingling, numbness in any extremity, No recent weight gain or loss, No  polydypsia or polyphagia,   All other systems reviewed and are negative.    Past History of the following :    Past Medical History:  Diagnosis Date  . Heart transplant recipient [facility])   . Hypertension       Past Surgical History:   Procedure Laterality Date  . APPENDECTOMY    . CARDIAC SURGERY    . CHOLECYSTECTOMY    . HERNIA REPAIR    . left ear        Social History:      Social History   Tobacco Use  . Smoking status: Current Every Day Smoker    Packs/day: 0.50    Types: Cigarettes  . Smokeless tobacco: Never Used  . Tobacco comment: black and mild  Substance Use Topics  . Alcohol use: Yes    Comment: occ       Family History :    History reviewed. No pertinent family history. Fam history of HTN   Home Medications:   Prior to Admission medications   Medication Sig Start Date End Date Taking? Authorizing Provider  acetaminophen (TYLENOL) 500 MG tablet Take 500 mg by mouth every 4 (four) hours as needed for moderate pain.   Yes [provider]  allopurinol (ZYLOPRIM) 100 MG tablet Take 100 mg by mouth daily.   Yes [provider]  amitriptyline (ELAVIL) 10 MG tablet Take 10 mg by mouth at bedtime. 01/12/21  Yes [provider]  aspirin 81 MG chewable tablet Chew 81 mg by mouth daily.   Yes [provider]  calcium acetate (PHOSLO) 667 MG capsule Take 2,001 mg by mouth 3 (three) times daily with meals. 2001 mg with meals and 1334 with snacks 06/10/20  Yes [provider]  carvedilol (COREG) 6.25 MG tablet Take 6.25 mg by mouth 2 (two) times daily.    Yes [provider]  colestipol (COLESTID) 1 g tablet Take 1 tablet by mouth daily. 09/16/20  Yes [provider]  cyclobenzaprine (FLEXERIL) 10 MG tablet Take 10 mg by mouth 2 (two) times daily. 10/22/20  Yes [provider]  diltiazem (CARDIZEM CD) 360 MG 24 hr capsule Take 360 mg by mouth in the morning and at bedtime. 01/12/21  Yes [provider]  famotidine (PEPCID) 10 MG tablet Take 1 tablet (10 mg total) by mouth at bedtime. 02/21/21  Yes Barton Dubois, MD  folic acid (FOLVITE) 1 MG tablet Take 1 mg by mouth daily.   Yes [provider]  gabapentin (NEURONTIN)  100 MG capsule Take 100 mg by mouth 3 (three) times daily. 11/03/20  Yes [provider]  hydrALAZINE (APRESOLINE) 50 MG tablet Take 50  mg by mouth in the morning and at bedtime.   Yes [provider]  hyoscyamine (ANASPAZ) 0.125 MG TBDP disintergrating tablet Place 0.125 mg under the tongue every 6 (six) hours as needed for cramping. 02/12/20 03/04/21 Yes [provider]  imiquimod (ALDARA) 5 % cream Apply 1 application topically 3 (three) times a week. 10/06/20  Yes [provider]  linaclotide Rolan Lipa) 145 MCG CAPS capsule Take 1 capsule (145 mcg total) by mouth daily before breakfast. Hold for diarrhea 02/21/21  Yes Barton Dubois, MD  loperamide (IMODIUM) 2 MG capsule Take 2-4 mg by mouth every 6 (six) hours as needed. 11/11/20  Yes [provider]  magnesium oxide (MAG-OX) 400 MG tablet Take 400 mg by mouth daily.   Yes [provider]  melatonin 3 MG TABS tablet Take 1.5 tablets (4.5 mg total) by mouth at bedtime. 02/21/21 12/13/22 Yes Barton Dubois, MD  mycophenolate (CELLCEPT) 250 MG capsule Take 500 mg by mouth 2 (two) times daily. 12/20/20 01/12/22 Yes [provider]  ondansetron (ZOFRAN) 4 MG tablet Take 4 mg by mouth every 8 (eight) hours as needed for nausea or vomiting.   Yes [provider]  ondansetron (ZOFRAN-ODT) 4 MG disintegrating tablet Take 4 mg by mouth every 8 (eight) hours as needed. 02/19/21  Yes [provider]  pantoprazole (PROTONIX) 40 MG tablet Take 1 tablet (40 mg total) by mouth 2 (two) times daily. 02/21/21  Yes Barton Dubois, MD  pravastatin (PRAVACHOL) 40 MG tablet Take 40 mg by mouth daily.   Yes [provider]  predniSONE (DELTASONE) 5 MG tablet Take 5 mg by mouth daily with breakfast. 01/12/21  Yes [provider]  sirolimus (RAPAMUNE) 1 MG tablet Take 1 mg by mouth daily.   Yes [provider]  sucralfate (CARAFATE) 1 GM/10ML suspension Take 10 mLs (1 g total) by  mouth 4 (four) times daily -  with meals and at bedtime for 14 days. 02/21/21 03/07/21 Yes Barton Dubois, MD  tacrolimus (PROGRAF) 1 MG capsule Take 6 mg by mouth at bedtime. Take 5 mg every morning   Yes [provider]  tacrolimus (PROGRAF) 5 MG capsule Take 5 mg by mouth daily. Take 5 mg every morning and 6 mg every evening   Yes [provider]  calcitRIOL (ROCALTROL) 0.5 MCG capsule Resume As per outpatient nephrology. Patient not taking: Reported on 02/20/2021 12/20/20   [provider]  iron polysaccharides (NIFEREX) 150 MG capsule Take 150 mg by mouth daily. Patient not taking: Reported on 02/20/2021    [provider]  Multiple Vitamins-Minerals (MULTIVITAMIN ADULT) TABS Take 1 tablet by mouth daily. Patient not taking: Reported on 02/20/2021    [provider]  multivitamin (RENA-VIT) TABS tablet Take 1 tablet by mouth daily. Patient not taking: Reported on 02/20/2021 08/17/19 08/29/21  [provider]     Allergies:     Allergies  Allergen Reactions  . Nsaids     Cant take due to heart transplant  . Tolmetin Other (See Comments)    Cant take due to heart transplant  . Varenicline Other (See Comments)    Vivid dreams  . Phenergan [Promethazine Hcl] Anxiety     Physical Exam:   Vitals  Blood pressure 96/71, pulse (!) 101, temperature 98.8 F (37.1 C), temperature source Oral, resp. rate 19, height 5\' 11"  (1.803 m), weight 61.2 kg, SpO2 100 %.  1.  General: Patient lying supine in bed no acute distress  2. Psychiatric:  Flat affect, behavior normal for situation, pleasant, cooperative with exam  3. Neurologic: Face is symmetric, tongue protrudes midline, extraocular muscles intact, complete loss of left visual field, right visual field intact, moves all 4 extremities voluntarily, equal sensation in the upper and lower extremities bilaterally, oriented x3  4. HEENMT:  Head is atraumatic, normocephalic, EOMI, neck supple,  trachea midline, mucous membranes moist  5. Respiratory : Lungs are clear to auscultation bilaterally without wheezes, rales, rhonchi, no clubbing, no cyanosis  6. Cardiovascular : Heart rate is normal, rhythm is regular, loud systolic murmur, no rubs or gallops, trace peripheral edema, peripheral pulses palpated  7. Gastrointestinal:  Abdomen is soft, nondistended, nontender to palpation, no palpable masses, bowel sounds active  8. Skin:  Skin is warm dry and intact without acute lesion on limited exam  9.Musculoskeletal:  No acute deformities, no calf tenderness, peripheral pulses palpated    Data Review:    CBC Recent Labs  Lab 03/04/21 1623  WBC 9.3  HGB 8.8*  HCT 25.5*  PLT 156  MCV 87.0  MCH 30.0  MCHC 34.5  RDW 15.1  LYMPHSABS 1.9  MONOABS 0.8  EOSABS 0.2  BASOSABS 0.0   ------------------------------------------------------------------------------------------------------------------  Results for orders placed or performed during the hospital encounter of 03/04/21 (from the past 48 hour(s))  Resp Panel by RT-PCR (Flu A&B, Covid) Nasopharyngeal Swab     Status: None   Collection Time: 03/04/21  4:02 PM   Specimen: Nasopharyngeal Swab; Nasopharyngeal(NP) swabs in vial transport medium  Result Value Ref Range   SARS Coronavirus 2 by RT PCR NEGATIVE NEGATIVE    Comment: (NOTE) SARS-CoV-2 target nucleic acids are NOT DETECTED.  The SARS-CoV-2 RNA is generally detectable in upper respiratory specimens during the acute phase of infection. The lowest concentration of SARS-CoV-2 viral copies this assay can detect is 138 copies/mL. A negative result does not preclude SARS-Cov-2 infection and should not be used as the sole basis for treatment or other patient management decisions. A negative result may occur with  improper specimen collection/handling, submission of specimen other than nasopharyngeal swab, presence of viral mutation(s) within the areas targeted by  this assay, and inadequate number of viral copies(<138 copies/mL). A negative result must be combined with clinical observations, patient history, and epidemiological information. The expected result is Negative.  Fact Sheet for Patients:  EntrepreneurPulse.com.au  Fact Sheet for Healthcare Providers:  IncredibleEmployment.be  This test is no t yet approved or cleared by the [place] FDA and  has been authorized for detection and/or diagnosis of SARS-CoV-2 by FDA under an Emergency Use Authorization (EUA). This EUA will remain  in effect (meaning this test can be used) for the duration of the COVID-19 declaration under Section 564(b)(1) of the Act, 21 U.S.C.section 360bbb-3(b)(1), unless the authorization is terminated  or revoked sooner.       Influenza A by PCR NEGATIVE NEGATIVE   Influenza B by PCR NEGATIVE NEGATIVE    Comment: (NOTE) The Xpert Xpress SARS-CoV-2/FLU/RSV plus assay is intended as an aid in the diagnosis of influenza from Nasopharyngeal swab specimens and should not be used as a sole basis for treatment. Nasal washings and aspirates are unacceptable for Xpert Xpress SARS-CoV-2/FLU/RSV testing.  Fact Sheet for Patients: EntrepreneurPulse.com.au  Fact Sheet for Healthcare Providers: IncredibleEmployment.be  This test is not yet approved or cleared by the [place] FDA and has been authorized for detection and/or diagnosis of SARS-CoV-2 by FDA under an Emergency Use Authorization (EUA). This EUA will remain in  effect (meaning this test can be used) for the duration of the COVID-19 declaration under Section 564(b)(1) of the Act, 21 U.S.C. section 360bbb-3(b)(1), unless the authorization is terminated or revoked.  Performed at [facility], [address] [employer] [place], [place] [address]   Ethanol     Status: None   Collection Time: 03/04/21  4:23 PM  Result Value Ref Range    Alcohol, Ethyl (B) <10 <10 mg/dL    Comment: (NOTE) Lowest detectable limit for serum alcohol is 10 mg/dL.  For medical purposes only. Performed at [facility], [address], [place], [place] [address]   Protime-INR     Status: None   Collection Time: 03/04/21  4:23 PM  Result Value Ref Range   Prothrombin Time 14.2 11.4 - 15.2 seconds   INR 1.1 0.8 - 1.2    Comment: (NOTE) INR goal varies based on device and disease states. Performed at [facility], [address], [place], [place] [address]   APTT     Status: None   Collection Time: 03/04/21  4:23 PM  Result Value Ref Range   aPTT 35 24 - 36 seconds    Comment: Performed at [facility], [address] [place], [place] [address]  CBC     Status: Abnormal   Collection Time: 03/04/21  4:23 PM  Result Value Ref Range   WBC 9.3 4.0 - 10.5 K/uL   RBC 2.93 (L) 4.22 - 5.81 MIL/uL   Hemoglobin 8.8 (L) 13.0 - 17.0 g/dL   HCT 25.5 (L) 39.0 - 52.0 %   MCV 87.0 80.0 - 100.0 fL   MCH 30.0 26.0 - 34.0 pg   MCHC 34.5 30.0 - 36.0 g/dL   RDW 15.1 11.5 - 15.5 %   Platelets 156 150 - 400 K/uL   nRBC 0.0 0.0 - 0.2 %    Comment: Performed at [facility], [address], [place], [place] [address]  Differential     Status: None   Collection Time: 03/04/21  4:23 PM  Result Value Ref Range   Neutrophils Relative % 68 %   Neutro Abs 6.3 1.7 - 7.7 K/uL   Lymphocytes Relative 21 %   Lymphs Abs 1.9 0.7 - 4.0 K/uL   Monocytes Relative 9 %   Monocytes Absolute 0.8 0.1 - 1.0 K/uL   Eosinophils Relative 2 %   Eosinophils Absolute 0.2 0.0 - 0.5 K/uL   Basophils Relative 0 %   Basophils Absolute 0.0 0.0 - 0.1 K/uL   Immature Granulocytes 0 %   Abs Immature Granulocytes 0.04 0.00 - 0.07 K/uL    Comment: Performed at [facility], [address] [place], [place] [address]  Comprehensive metabolic panel     Status: Abnormal   Collection Time: 03/04/21  4:23 PM  Result Value Ref Range   Sodium 134 (L) 135 - 145 mmol/L   Potassium 4.3 3.5 - 5.1  mmol/L   Chloride 95 (L) 98 - 111 mmol/L   CO2 25 22 - 32 mmol/L   Glucose, Bld 97 70 - 99 mg/dL    Comment: Glucose reference range applies only to samples taken after fasting for at least 8 hours.   BUN 35 (H) 6 - 20 mg/dL   Creatinine, Ser 9.59 (H) 0.61 - 1.24 mg/dL   Calcium 9.6 8.9 - 10.3 mg/dL   Total Protein 7.3 6.5 - 8.1 g/dL   Albumin 2.8 (L) 3.5 - 5.0 g/dL   AST 13 (L) 15 - 41 U/L  ALT 17 0 - 44 U/L   Alkaline Phosphatase 59 38 - 126 U/L   Total Bilirubin 0.4 0.3 - 1.2 mg/dL   GFR, Estimated 6 (L) >60 mL/min    Comment: (NOTE) Calculated using the CKD-EPI Creatinine Equation (2021)    Anion gap 14 5 - 15    Comment: Performed at [facility], [address], [place], [place] [address]    Chemistries  Recent Labs  Lab 03/04/21 1623  NA 134*  K 4.3  CL 95*  CO2 25  GLUCOSE 97  BUN 35*  CREATININE 9.59*  CALCIUM 9.6  AST 13*  ALT 17  ALKPHOS 59  BILITOT 0.4   ------------------------------------------------------------------------------------------------------------------  ------------------------------------------------------------------------------------------------------------------ GFR: Estimated Creatinine Clearance: 8.7 mL/min (A) (by C-G formula based on SCr of 9.59 mg/dL (H)). Liver Function Tests: Recent Labs  Lab 03/04/21 1623  AST 13*  ALT 17  ALKPHOS 59  BILITOT 0.4  PROT 7.3  ALBUMIN 2.8*   No results for input(s): LIPASE, AMYLASE in the last 168 hours. No results for input(s): AMMONIA in the last 168 hours. Coagulation Profile: Recent Labs  Lab 03/04/21 1623  INR 1.1   Cardiac Enzymes: No results for input(s): CKTOTAL, CKMB, CKMBINDEX, TROPONINI in the last 168 hours. BNP (last 3 results) No results for input(s): PROBNP in the last 8760 hours. HbA1C: No results for input(s): HGBA1C in the last 72 hours. CBG: No results for input(s): GLUCAP in the last 168 hours. Lipid Profile: No results for input(s): CHOL, HDL, LDLCALC,  TRIG, CHOLHDL, LDLDIRECT in the last 72 hours. Thyroid Function Tests: No results for input(s): TSH, T4TOTAL, FREET4, T3FREE, THYROIDAB in the last 72 hours. Anemia Panel: No results for input(s): VITAMINB12, FOLATE, FERRITIN, TIBC, IRON, RETICCTPCT in the last 72 hours.  --------------------------------------------------------------------------------------------------------------- Urine analysis:    Component Value Date/Time   COLORURINE YELLOW 01/02/2008 1216   APPEARANCEUR CLEAR 01/02/2008 1216   LABSPEC 1.020 01/02/2008 1216   PHURINE 5.0 01/02/2008 1216   GLUCOSEU NEGATIVE 01/02/2008 1216   HGBUR LARGE (A) 01/02/2008 1216   BILIRUBINUR NEGATIVE 01/02/2008 1216   KETONESUR NEGATIVE 01/02/2008 1216   PROTEINUR NEGATIVE 01/02/2008 1216   UROBILINOGEN 0.2 01/02/2008 1216   NITRITE NEGATIVE 01/02/2008 1216   LEUKOCYTESUR SMALL (A) 01/02/2008 1216      Imaging Results:    CT Angio Head W or Wo Contrast  Result Date: 03/04/2021 CLINICAL DATA:  Left eye vision loss for 5 days. EXAM: CT ANGIOGRAPHY HEAD AND NECK TECHNIQUE: Multidetector CT imaging of the head and neck was performed using the standard protocol during bolus administration of intravenous contrast. Multiplanar CT image reconstructions and MIPs were obtained to evaluate the vascular anatomy. Carotid stenosis measurements (when applicable) are obtained utilizing NASCET criteria, using the distal internal carotid diameter as the denominator. CONTRAST:  30mL OMNIPAQUE IOHEXOL 350 MG/ML SOLN COMPARISON:  None. FINDINGS: CTA NECK FINDINGS Aortic arch: Normal variant aortic arch branching pattern with common origin of the brachiocephalic and left common carotid arteries. Right carotid system: Patent without evidence of stenosis, dissection, or significant atherosclerosis. Left carotid system: Patent without evidence of stenosis, dissection, or significant atherosclerosis. Vertebral arteries: Patent without evidence of stenosis,  dissection, or significant atherosclerosis. Skeleton: Edentulous. Moderate multilevel disc space narrowing in the cervical spine. Other neck: No evidence of cervical lymphadenopathy or mass. Upper chest: Mild centrilobular emphysema in the lung apices with incompletely imaged asymmetric subpleural reticulation in the right upper lobe which may reflect scarring/mild fibrosis. Review of the MIP images confirms the above  findings CTA HEAD FINDINGS Anterior circulation: The internal carotid arteries are widely patent from skull base to carotid termini. ACAs and MCAs are patent without evidence of a proximal branch occlusion or significant proximal stenosis. No aneurysm is identified. Posterior circulation: The intracranial vertebral arteries are widely patent to the basilar. Patent PICA and SCA origins are seen bilaterally. The basilar artery is widely patent. Posterior communicating arteries are diminutive or absent. Both PCAs are patent without evidence of a significant proximal stenosis. No aneurysm is identified. Venous sinuses: Patent. Anatomic variants: None. Review of the MIP images confirms the above findings IMPRESSION: 1. No large vessel occlusion, significant stenosis, or aneurysm in the head and neck. 2. Emphysema (ICD10-J43.9). Electronically Signed   By: Logan Bores M.D.   On: 03/04/2021 17:39   CT Head Wo Contrast  Result Date: 03/04/2021 CLINICAL DATA:  Neuro deficit, acute, stroke suspected. Additional history provided: Vision loss in left eye 5 days. EXAM: CT HEAD WITHOUT CONTRAST TECHNIQUE: Contiguous axial images were obtained from the base of the skull through the vertex without intravenous contrast. COMPARISON:  Report from head CT 08/06/2007 (images currently unavailable). FINDINGS: Brain: Cerebral volume is normal for age. A small chronic infarct is questioned within the right cerebellar hemisphere (for instance as seen on series 4, image 52). There is no acute intracranial hemorrhage. No  demarcated cortical infarct. No extra-axial fluid collection. No evidence of intracranial mass. No midline shift. Partially empty sella turcica. Vascular: No hyperdense vessel. Skull: Normal. Negative for fracture or focal lesion. Sinuses/Orbits:a Visualized orbits show no acute finding. No significant paranasal sinus disease at the imaged levels. IMPRESSION: No evidence of acute intracranial abnormality. A small chronic infarct is questioned within the right cerebellar hemisphere. Electronically Signed   By: Kellie Simmering DO   On: 03/04/2021 15:42   CT Angio Neck W and/or Wo Contrast  Result Date: 03/04/2021 CLINICAL DATA:  Left eye vision loss for 5 days. EXAM: CT ANGIOGRAPHY HEAD AND NECK TECHNIQUE: Multidetector CT imaging of the head and neck was performed using the standard protocol during bolus administration of intravenous contrast. Multiplanar CT image reconstructions and MIPs were obtained to evaluate the vascular anatomy. Carotid stenosis measurements (when applicable) are obtained utilizing NASCET criteria, using the distal internal carotid diameter as the denominator. CONTRAST:  1mL OMNIPAQUE IOHEXOL 350 MG/ML SOLN COMPARISON:  None. FINDINGS: CTA NECK FINDINGS Aortic arch: Normal variant aortic arch branching pattern with common origin of the brachiocephalic and left common carotid arteries. Right carotid system: Patent without evidence of stenosis, dissection, or significant atherosclerosis. Left carotid system: Patent without evidence of stenosis, dissection, or significant atherosclerosis. Vertebral arteries: Patent without evidence of stenosis, dissection, or significant atherosclerosis. Skeleton: Edentulous. Moderate multilevel disc space narrowing in the cervical spine. Other neck: No evidence of cervical lymphadenopathy or mass. Upper chest: Mild centrilobular emphysema in the lung apices with incompletely imaged asymmetric subpleural reticulation in the right upper lobe which may reflect  scarring/mild fibrosis. Review of the MIP images confirms the above findings CTA HEAD FINDINGS Anterior circulation: The internal carotid arteries are widely patent from skull base to carotid termini. ACAs and MCAs are patent without evidence of a proximal branch occlusion or significant proximal stenosis. No aneurysm is identified. Posterior circulation: The intracranial vertebral arteries are widely patent to the basilar. Patent PICA and SCA origins are seen bilaterally. The basilar artery is widely patent. Posterior communicating arteries are diminutive or absent. Both PCAs are patent without evidence of a significant proximal stenosis. No aneurysm  is identified. Venous sinuses: Patent. Anatomic variants: None. Review of the MIP images confirms the above findings IMPRESSION: 1. No large vessel occlusion, significant stenosis, or aneurysm in the head and neck. 2. Emphysema (ICD10-J43.9). Electronically Signed   By: Logan Bores M.D.   On: 03/04/2021 17:39    My personal review of EKG: Rhythm junctional tachycardia with a rate of 103/min, QTc 541,no Acute ST changes   Assessment & Plan:    Principal Problem:   CVA (cerebral vascular accident) ([place]) Active Problems:   Heart transplant recipient [facility])   ESRD (end stage renal disease) ([place])   Gastroesophageal reflux disease with esophagitis without hemorrhage   Retinal artery occlusion   Protein-calorie malnutrition, moderate (HCC)   Tobacco use disorder   1. Retinal artery occlusion and CVA workup 1. Sudden loss of vision in left eye 2. Neurology recommends stroke work-up 3. CTA head and neck and CT head showed no acute findings 4. MRI brain to be done on arrival to [facility] as patient has pacemaker and we are not able to do MRI here any pen 5. Echo in the a.m. 6. ST OT PT eval and treat 7. Inpatient neuro consult 8. Monitor on telemetry 2. ESRD 1. Nephro consult 2. HD on Tuesday Thursday Saturday 3. Left AV fistula 3. Protein calorie  malnutrition 1. Albumin 2.8 2. Encourage nutrient dense food choices 4. Anemia of chronic disease 1. In the setting of ESRD 2. Hemoglobin stable at 8.8 -last hemoglobin 8.4 3. Was recently admitted in March and had 2 units transfused 4. Trend in the a.m. 5. History of heart transplant 1. Continue home antirejection medications 6. Hyperlipidemia 1. Continue statin and Colestid 7. Hypertension 1. Blood pressure well controlled in the ED 2. Continue Coreg, diltiazem, hydralazine 8. GERD 1. Continue famotidine and Protonix 9. Tobacco use disorder 1. Smokes a black and milds every other day 2. Nicotine patch ordered 3. Counseled on cessation 10. DVT prophylaxis 1. Holding heparin as patient reports a " near fatal reaction" to Heparin at [facility] before it became [facility] 2. Is likely the patient does get heparin flushes at dialysis, but until further data is collected we will hold heparin 3. Continue with SCDs    AM Labs Ordered, also please review Full Orders  Family Communication: No family at bedside  Code Status: Full Admission status: ObservationTime spent in minutes : [place]

## 2021-03-05 ENCOUNTER — Observation Stay (HOSPITAL_COMMUNITY): Payer: Medicare Other

## 2021-03-05 DIAGNOSIS — I6389 Other cerebral infarction: Secondary | ICD-10-CM | POA: Diagnosis not present

## 2021-03-05 DIAGNOSIS — H349 Unspecified retinal vascular occlusion: Secondary | ICD-10-CM | POA: Diagnosis not present

## 2021-03-05 LAB — ECHOCARDIOGRAM COMPLETE
AR max vel: 1.27 cm2
AV Area VTI: 1.32 cm2
AV Area mean vel: 1.14 cm2
AV Mean grad: 12 mmHg
AV Peak grad: 18.1 mmHg
Ao pk vel: 2.13 m/s
Area-P 1/2: 3.99 cm2
Calc EF: 50.3 %
Height: 71 in
MV VTI: 1.61 cm2
S' Lateral: 3 cm
Single Plane A2C EF: 48.8 %
Single Plane A4C EF: 48.8 %
Weight: 2160 oz

## 2021-03-05 LAB — CBC
HCT: 23.2 % — ABNORMAL LOW (ref 39.0–52.0)
Hemoglobin: 8.3 g/dL — ABNORMAL LOW (ref 13.0–17.0)
MCH: 29.7 pg (ref 26.0–34.0)
MCHC: 35.8 g/dL (ref 30.0–36.0)
MCV: 83.2 fL (ref 80.0–100.0)
Platelets: 132 10*3/uL — ABNORMAL LOW (ref 150–400)
RBC: 2.79 MIL/uL — ABNORMAL LOW (ref 4.22–5.81)
RDW: 14.6 % (ref 11.5–15.5)
WBC: 8.7 10*3/uL (ref 4.0–10.5)
nRBC: 0 % (ref 0.0–0.2)

## 2021-03-05 LAB — HEMOGLOBIN A1C
Hgb A1c MFr Bld: 5.3 % (ref 4.8–5.6)
Mean Plasma Glucose: 105.41 mg/dL

## 2021-03-05 LAB — COMPREHENSIVE METABOLIC PANEL
ALT: 14 U/L (ref 0–44)
AST: 11 U/L — ABNORMAL LOW (ref 15–41)
Albumin: 2.3 g/dL — ABNORMAL LOW (ref 3.5–5.0)
Alkaline Phosphatase: 49 U/L (ref 38–126)
Anion gap: 15 (ref 5–15)
BUN: 37 mg/dL — ABNORMAL HIGH (ref 6–20)
CO2: 25 mmol/L (ref 22–32)
Calcium: 9.2 mg/dL (ref 8.9–10.3)
Chloride: 93 mmol/L — ABNORMAL LOW (ref 98–111)
Creatinine, Ser: 10.69 mg/dL — ABNORMAL HIGH (ref 0.61–1.24)
GFR, Estimated: 6 mL/min — ABNORMAL LOW (ref >60)
Glucose, Bld: 96 mg/dL (ref 70–99)
Potassium: 4.6 mmol/L (ref 3.5–5.1)
Sodium: 133 mmol/L — ABNORMAL LOW (ref 135–145)
Total Bilirubin: 0.4 mg/dL (ref 0.3–1.2)
Total Protein: 5.9 g/dL — ABNORMAL LOW (ref 6.5–8.1)

## 2021-03-05 LAB — LIPID PANEL
Cholesterol: 138 mg/dL (ref 0–200)
HDL: 71 mg/dL (ref >40)
LDL Cholesterol: 54 mg/dL (ref 0–99)
Total CHOL/HDL Ratio: 1.9 RATIO
Triglycerides: 64 mg/dL (ref <150)
VLDL: 13 mg/dL (ref 0–40)

## 2021-03-05 MED ORDER — ONDANSETRON HCL 4 MG/2ML IJ SOLN
INTRAMUSCULAR | Status: AC
  Administered 2021-03-05: 4 mg via INTRAVENOUS
  Filled 2021-03-05: qty 2

## 2021-03-05 MED ORDER — PREDNISONE 5 MG PO TABS
5.0000 mg | ORAL_TABLET | Freq: Every day | ORAL | Status: DC
  Administered 2021-03-05 – 2021-03-08 (×4): 5 mg via ORAL
  Filled 2021-03-05 (×4): qty 1

## 2021-03-05 MED ORDER — CHLORHEXIDINE GLUCONATE CLOTH 2 % EX PADS: 6.0000 | MEDICATED_PAD | Freq: Every day | CUTANEOUS | Status: DC

## 2021-03-05 MED ORDER — CLOPIDOGREL BISULFATE 75 MG PO TABS
75.0000 mg | ORAL_TABLET | Freq: Every day | ORAL | Status: DC
  Administered 2021-03-05 – 2021-03-08 (×4): 75 mg via ORAL
  Filled 2021-03-05 (×4): qty 1

## 2021-03-05 NOTE — Plan of Care (Signed)
  Problem: Education: Goal: Knowledge of General Education information will improve Description: Including pain rating scale, medication(s)/side effects and non-pharmacologic comfort measures Outcome: Progressing   Problem: Health Behavior/Discharge Planning: Goal: Ability to manage health-related needs will improve Outcome: Progressing   Problem: Clinical Measurements: Goal: Ability to maintain clinical measurements within normal limits will improve Outcome: Progressing Goal: Will remain free from infection Outcome: Progressing Goal: Diagnostic test results will improve Outcome: Progressing Goal: Respiratory complications will improve Outcome: Progressing Goal: Cardiovascular complication will be avoided Outcome: Progressing   Problem: Activity: Goal: Risk for activity intolerance will decrease Outcome: Progressing   Problem: Nutrition: Goal: Adequate nutrition will be maintained Outcome: Progressing   Problem: Coping: Goal: Level of anxiety will decrease Outcome: Progressing   Problem: Elimination: Goal: Will not experience complications related to bowel motility Outcome: Progressing Goal: Will not experience complications related to urinary retention Outcome: Progressing   Problem: Pain Managment: Goal: General experience of comfort will improve Outcome: Progressing   Problem: Safety: Goal: Ability to remain free from injury will improve Outcome: Progressing   Problem: Skin Integrity: Goal: Risk for impaired skin integrity will decrease Outcome: Progressing   Problem: Education: Goal: Knowledge of disease or condition will improve Outcome: Progressing Goal: Knowledge of patient specific risk factors addressed and post discharge goals established will improve Outcome: Progressing

## 2021-03-05 NOTE — Progress Notes (Signed)
Patient admitted this shift from [facility] ED via stretcher.

## 2021-03-05 NOTE — Progress Notes (Signed)
Patient has abandoned pericardial leads and therefore cannot safely have a MRI. Cardiology PA Debroah Baller confirmed. Message sent to MD. Awaiting response. RN notified.

## 2021-03-05 NOTE — Care Management Obs Status (Signed)
[place] NOTIFICATION   Patient Details  Name: BODI PALMERI MRN: [id number] Date of Birth: Aug 15, 1976   Medicare Observation Status Notification Given:  Yes    Angelita Ingles, RN 03/05/2021, 4:15 PM

## 2021-03-05 NOTE — Progress Notes (Signed)
PROGRESS NOTE    TAHJE BORAWSKI  AVW:[id number] DOB: [birth year] DOA: 03/04/2021 PCP: Medicine, [facility]   Brief Narrative:  Antonio Powell  is a 45 y.o. male, with history of HTN, HLD, Heart transplant at age 52, ESRD with HD on T TH S, and more presents to the ED with a chief complaint of loss of vision in the left eye.  Patient reports that on 23 April he had sudden onset of bright light in his left eye followed by complete darkness.  This was painless.   No other associated complaint.   The vision never came back. He had to wait till he can get a ride so on 03/04/2021, he was able to go see a provider.  He reports he saw his PCP who sent him to an ophthalmologist.  The ophthalmologist gave him eyedrops and sent him to the ER.  No previous history of such. He was admitted for colitis about a week ago, but reports that his nausea vomiting has cleared up since then.    Patient has a history of heart transplant approximately 20 years ago, and heart valve replacements since then.  He reports compliance with his medication.  Patient does smoke black and milds, does not drink any alcohol, and uses marijuana 3-4 times per week.  Patient is vaccinated for COVID.   In the ED, he was hemodynamically stable. CT head shows no acute intracranial abnormality.  Small chronic infarct right cerebellar hemisphere. CTA head and neck shows no large vessel occlusion, significant stenosis or aneurysm in the head or neck. Dr. Leonel Ramsay was consulted and advised admission for stroke work-up including A1c, lipids, echo, CTA head and neck (done above), and MRI brain. Patient does have a pacemaker and was transferred to [facility] as there was no MRI at [facility].    Assessment & Plan:   Principal Problem:   Retinal artery occlusion Active Problems:   Heart transplant recipient [facility])   ESRD (end stage renal disease) ([place])   Gastroesophageal reflux disease with esophagitis without hemorrhage    Protein-calorie malnutrition, moderate (HCC)   Tobacco use disorder  Sudden painless vision loss on the left eye: No other focal deficit.  Vision has not changed since last 5 days.  CTA head and neck negative for any occlusion.  I doubt retinal artery occlusion.  Unfortunately, I was told that MRI cannot be done due to him having abandoned epicardial wires.  I have notified Dr. Quinn Axe of neurology.  Further management per neurology.  ESRD: Gets dialysis on TTS.  Nephrology on board.  Protein calorie malnutrition: Albumin 2.8.  Encourage nutrition.  Anemia of chronic disease: Hemoglobin is stable.  History of heart transplant: Continue antirejection medication as well as prednisone.  Hyperlipidemia: Continue statin and Colestid.  Essential hypertension: Blood pressure controlled.  Continue home medications which include Coreg, hydralazine and diltiazem.  GERD: Continue PPI and famotidine.  Tobacco dependence: Smoking cessation provided.  DVT prophylaxis: SCD's Start: 03/04/21 2343, per patient he is allergic to heparin.   Code Status: Full Code  Family Communication: None present at bedside.  Plan of care discussed with patient in length and he verbalized understanding and agreed with it.  Status is: Observation  The patient will require care spanning > 2 midnights and should be moved to inpatient because: Ongoing diagnostic testing needed not appropriate for outpatient work up  Dispo: The patient is from: Home              Anticipated  d/c is to: Home              Patient currently is not medically stable to d/c.   Difficult to place patient No        Estimated body mass index is 18.83 kg/m as calculated from the following:   Height as of this encounter: 5\' 11"  (1.803 m).   Weight as of this encounter: 61.2 kg.      Nutritional status:               Consultants:   Neurology and nephrology  Procedures:   None  Antimicrobials:  Anti-infectives (From  admission, onward)   None         Subjective: Patient seen and examined.  No other complaint except continued loss of vision in the left eye.  Objective: Vitals:   03/04/21 2230 03/04/21 2239 03/04/21 2327 03/05/21 0744  BP: (!) 114/92 (!) 114/92 (!) 117/94 119/84  Pulse:  (!) 104 (!) 105 96  Resp: 19 19 18 18   Temp:   98.6 F (37 C) 97.9 F (36.6 C)  TempSrc:   Oral Oral  SpO2:  100% 99% 100%  Weight:      Height:       No intake or output data in the 24 hours ending 03/05/21 1102 Filed Weights   03/04/21 1412  Weight: 61.2 kg    Examination:  General exam: Appears calm and comfortable  Respiratory system: Clear to auscultation. Respiratory effort normal. Cardiovascular system: S1 & S2 heard, RRR. No JVD, murmurs, rubs, gallops or clicks. No pedal edema. Gastrointestinal system: Abdomen is nondistended, soft and nontender. No organomegaly or masses felt. Normal bowel sounds heard. Central nervous system: Alert and oriented. No focal neurological deficits.  Decreased vision in the left eye. Extremities: Symmetric 5 x 5 power. Skin: No rashes, lesions or ulcers Psychiatry: Judgement and insight appear normal. Mood & affect appropriate.    Data Reviewed: I have personally reviewed following labs and imaging studies  CBC: Recent Labs  Lab 03/04/21 1623 03/05/21 0314  WBC 9.3 8.7  NEUTROABS 6.3  --   HGB 8.8* 8.3*  HCT 25.5* 23.2*  MCV 87.0 83.2  PLT 156 329*   Basic Metabolic Panel: Recent Labs  Lab 03/04/21 1623 03/05/21 0314  NA 134* 133*  K 4.3 4.6  CL 95* 93*  CO2 25 25  GLUCOSE 97 96  BUN 35* 37*  CREATININE 9.59* 10.69*  CALCIUM 9.6 9.2   GFR: Estimated Creatinine Clearance: 7.8 mL/min (A) (by C-G formula based on SCr of 10.69 mg/dL (H)). Liver Function Tests: Recent Labs  Lab 03/04/21 1623 03/05/21 0314  AST 13* 11*  ALT 17 14  ALKPHOS 59 49  BILITOT 0.4 0.4  PROT 7.3 5.9*  ALBUMIN 2.8* 2.3*   No results for input(s): LIPASE,  AMYLASE in the last 168 hours. No results for input(s): AMMONIA in the last 168 hours. Coagulation Profile: Recent Labs  Lab 03/04/21 1623  INR 1.1   Cardiac Enzymes: No results for input(s): CKTOTAL, CKMB, CKMBINDEX, TROPONINI in the last 168 hours. BNP (last 3 results) No results for input(s): PROBNP in the last 8760 hours. HbA1C: Recent Labs    03/05/21 0314  HGBA1C 5.3   CBG: No results for input(s): GLUCAP in the last 168 hours. Lipid Profile: Recent Labs    03/05/21 0314  CHOL 138  HDL 71  LDLCALC 54  TRIG 64  CHOLHDL 1.9   Thyroid Function Tests: No results for  input(s): TSH, T4TOTAL, FREET4, T3FREE, THYROIDAB in the last 72 hours. Anemia Panel: No results for input(s): VITAMINB12, FOLATE, FERRITIN, TIBC, IRON, RETICCTPCT in the last 72 hours. Sepsis Labs: No results for input(s): PROCALCITON, LATICACIDVEN in the last 168 hours.  Recent Results (from the past 240 hour(s))  Resp Panel by RT-PCR (Flu A&B, Covid) Nasopharyngeal Swab     Status: None   Collection Time: 03/04/21  4:02 PM   Specimen: Nasopharyngeal Swab; Nasopharyngeal(NP) swabs in vial transport medium  Result Value Ref Range Status   SARS Coronavirus 2 by RT PCR NEGATIVE NEGATIVE Final    Comment: (NOTE) SARS-CoV-2 target nucleic acids are NOT DETECTED.  The SARS-CoV-2 RNA is generally detectable in upper respiratory specimens during the acute phase of infection. The lowest concentration of SARS-CoV-2 viral copies this assay can detect is 138 copies/mL. A negative result does not preclude SARS-Cov-2 infection and should not be used as the sole basis for treatment or other patient management decisions. A negative result may occur with  improper specimen collection/handling, submission of specimen other than nasopharyngeal swab, presence of viral mutation(s) within the areas targeted by this assay, and inadequate number of viral copies(<138 copies/mL). A negative result must be combined  with clinical observations, patient history, and epidemiological information. The expected result is Negative.  Fact Sheet for Patients:  EntrepreneurPulse.com.au  Fact Sheet for Healthcare Providers:  IncredibleEmployment.be  This test is no t yet approved or cleared by the [place] FDA and  has been authorized for detection and/or diagnosis of SARS-CoV-2 by FDA under an Emergency Use Authorization (EUA). This EUA will remain  in effect (meaning this test can be used) for the duration of the COVID-19 declaration under Section 564(b)(1) of the Act, 21 U.S.C.section 360bbb-3(b)(1), unless the authorization is terminated  or revoked sooner.       Influenza A by PCR NEGATIVE NEGATIVE Final   Influenza B by PCR NEGATIVE NEGATIVE Final    Comment: (NOTE) The Xpert Xpress SARS-CoV-2/FLU/RSV plus assay is intended as an aid in the diagnosis of influenza from Nasopharyngeal swab specimens and should not be used as a sole basis for treatment. Nasal washings and aspirates are unacceptable for Xpert Xpress SARS-CoV-2/FLU/RSV testing.  Fact Sheet for Patients: EntrepreneurPulse.com.au  Fact Sheet for Healthcare Providers: IncredibleEmployment.be  This test is not yet approved or cleared by the [place] FDA and has been authorized for detection and/or diagnosis of SARS-CoV-2 by FDA under an Emergency Use Authorization (EUA). This EUA will remain in effect (meaning this test can be used) for the duration of the COVID-19 declaration under Section 564(b)(1) of the Act, 21 U.S.C. section 360bbb-3(b)(1), unless the authorization is terminated or revoked.  Performed at [facility], [address] [place], [place] [address]       Radiology Studies: CT Angio Head W or Wo Contrast  Result Date: 03/04/2021 CLINICAL DATA:  Left eye vision loss for 5 days. EXAM: CT ANGIOGRAPHY HEAD AND NECK TECHNIQUE:  Multidetector CT imaging of the head and neck was performed using the standard protocol during bolus administration of intravenous contrast. Multiplanar CT image reconstructions and MIPs were obtained to evaluate the vascular anatomy. Carotid stenosis measurements (when applicable) are obtained utilizing NASCET criteria, using the distal internal carotid diameter as the denominator. CONTRAST:  24mL OMNIPAQUE IOHEXOL 350 MG/ML SOLN COMPARISON:  None. FINDINGS: CTA NECK FINDINGS Aortic arch: Normal variant aortic arch branching pattern with common origin of the brachiocephalic and left common carotid arteries. Right carotid system: Patent without  evidence of stenosis, dissection, or significant atherosclerosis. Left carotid system: Patent without evidence of stenosis, dissection, or significant atherosclerosis. Vertebral arteries: Patent without evidence of stenosis, dissection, or significant atherosclerosis. Skeleton: Edentulous. Moderate multilevel disc space narrowing in the cervical spine. Other neck: No evidence of cervical lymphadenopathy or mass. Upper chest: Mild centrilobular emphysema in the lung apices with incompletely imaged asymmetric subpleural reticulation in the right upper lobe which may reflect scarring/mild fibrosis. Review of the MIP images confirms the above findings CTA HEAD FINDINGS Anterior circulation: The internal carotid arteries are widely patent from skull base to carotid termini. ACAs and MCAs are patent without evidence of a proximal branch occlusion or significant proximal stenosis. No aneurysm is identified. Posterior circulation: The intracranial vertebral arteries are widely patent to the basilar. Patent PICA and SCA origins are seen bilaterally. The basilar artery is widely patent. Posterior communicating arteries are diminutive or absent. Both PCAs are patent without evidence of a significant proximal stenosis. No aneurysm is identified. Venous sinuses: Patent. Anatomic variants:  None. Review of the MIP images confirms the above findings IMPRESSION: 1. No large vessel occlusion, significant stenosis, or aneurysm in the head and neck. 2. Emphysema (ICD10-J43.9). Electronically Signed   By: Logan Bores M.D.   On: 03/04/2021 17:39   CT Head Wo Contrast  Result Date: 03/04/2021 CLINICAL DATA:  Neuro deficit, acute, stroke suspected. Additional history provided: Vision loss in left eye 5 days. EXAM: CT HEAD WITHOUT CONTRAST TECHNIQUE: Contiguous axial images were obtained from the base of the skull through the vertex without intravenous contrast. COMPARISON:  Report from head CT 08/06/2007 (images currently unavailable). FINDINGS: Brain: Cerebral volume is normal for age. A small chronic infarct is questioned within the right cerebellar hemisphere (for instance as seen on series 4, image 52). There is no acute intracranial hemorrhage. No demarcated cortical infarct. No extra-axial fluid collection. No evidence of intracranial mass. No midline shift. Partially empty sella turcica. Vascular: No hyperdense vessel. Skull: Normal. Negative for fracture or focal lesion. Sinuses/Orbits:a Visualized orbits show no acute finding. No significant paranasal sinus disease at the imaged levels. IMPRESSION: No evidence of acute intracranial abnormality. A small chronic infarct is questioned within the right cerebellar hemisphere. Electronically Signed   By: Kellie Simmering DO   On: 03/04/2021 15:42   CT Angio Neck W and/or Wo Contrast  Result Date: 03/04/2021 CLINICAL DATA:  Left eye vision loss for 5 days. EXAM: CT ANGIOGRAPHY HEAD AND NECK TECHNIQUE: Multidetector CT imaging of the head and neck was performed using the standard protocol during bolus administration of intravenous contrast. Multiplanar CT image reconstructions and MIPs were obtained to evaluate the vascular anatomy. Carotid stenosis measurements (when applicable) are obtained utilizing NASCET criteria, using the distal internal carotid  diameter as the denominator. CONTRAST:  9mL OMNIPAQUE IOHEXOL 350 MG/ML SOLN COMPARISON:  None. FINDINGS: CTA NECK FINDINGS Aortic arch: Normal variant aortic arch branching pattern with common origin of the brachiocephalic and left common carotid arteries. Right carotid system: Patent without evidence of stenosis, dissection, or significant atherosclerosis. Left carotid system: Patent without evidence of stenosis, dissection, or significant atherosclerosis. Vertebral arteries: Patent without evidence of stenosis, dissection, or significant atherosclerosis. Skeleton: Edentulous. Moderate multilevel disc space narrowing in the cervical spine. Other neck: No evidence of cervical lymphadenopathy or mass. Upper chest: Mild centrilobular emphysema in the lung apices with incompletely imaged asymmetric subpleural reticulation in the right upper lobe which may reflect scarring/mild fibrosis. Review of the MIP images confirms the above findings CTA  HEAD FINDINGS Anterior circulation: The internal carotid arteries are widely patent from skull base to carotid termini. ACAs and MCAs are patent without evidence of a proximal branch occlusion or significant proximal stenosis. No aneurysm is identified. Posterior circulation: The intracranial vertebral arteries are widely patent to the basilar. Patent PICA and SCA origins are seen bilaterally. The basilar artery is widely patent. Posterior communicating arteries are diminutive or absent. Both PCAs are patent without evidence of a significant proximal stenosis. No aneurysm is identified. Venous sinuses: Patent. Anatomic variants: None. Review of the MIP images confirms the above findings IMPRESSION: 1. No large vessel occlusion, significant stenosis, or aneurysm in the head and neck. 2. Emphysema (ICD10-J43.9). Electronically Signed   By: Logan Bores M.D.   On: 03/04/2021 17:39    Scheduled Meds: . amitriptyline  10 mg Oral QHS  . aspirin  81 mg Oral Daily  . calcium  acetate  2,001 mg Oral TID WC  . carvedilol  6.25 mg Oral BID WC  . colestipol  1 g Oral Daily  . diltiazem  360 mg Oral Daily  . famotidine  10 mg Oral QHS  . gabapentin  100 mg Oral TID  . melatonin  4.5 mg Oral QHS  . multivitamin  1 tablet Oral Daily  . mycophenolate  500 mg Oral BID  . nicotine  14 mg Transdermal Daily  . pantoprazole  40 mg Oral BID  . pravastatin  40 mg Oral q1800  . predniSONE  5 mg Oral Q breakfast  . sirolimus  1 mg Oral Daily  . tacrolimus  5 mg Oral Daily  . tacrolimus  6 mg Oral QHS   Continuous Infusions:   LOS: 0 days   Time spent: 37 minutes   Darliss Cheney, MD [facility]  03/05/2021, 11:02 AM   To contact the attending provider between 7A-7P or the covering provider during after hours 7P-7A, please log into the web site [email or web address].

## 2021-03-05 NOTE — Evaluation (Signed)
Occupational Therapy Evaluation Patient Details Name: Antonio Powell MRN: [id number] DOB: 10-25-1976 Today's Date: 03/05/2021    History of Present Illness Pt is a 45 y.o. male admitted 03/04/21 with L eye vision loss; pt reports sudden onset of bright eye on 02/28/21 then vision going completely dark, but today was first day he could get a ride for medical help. Head CT negative for acute injury; chronic R cerebellar infarct. Awaiting MRI. PMH includes HTN, ESRD (HD TTS), heart transplant (age 55), pacemaker.   Clinical Impression   Pt admitted with the above diagnoses and presents with below problem list. Pt will benefit from continued acute OT to address the below listed deficits and maximize independence with basic ADLs prior to d/c home. At baseline pt is independent to mod I with ADLs, does not drive. Pt presents with significant vision loss in L eye with only a very small, central area that he is able to see. Pt describes this small area of vision as baseline blurriness. Discussed visual compensatory strategies for ADLs/IADLs, functional transfers and mobility at home and in the community.      Follow Up Recommendations    No OT follow-up  Equipment Recommendations    None   Recommendations for Other Services       Precautions / Restrictions Precautions Precautions: Other (comment) Precaution Comments: L eye vision loss pt appears to have small amount of tunnel vision (reports chronic blurred vision "from astigmatism" prior to vision loss) Restrictions Weight Bearing Restrictions: No      Mobility Bed Mobility Overal bed mobility: Independent             General bed mobility comments: up in recliner.    Transfers Overall transfer level: Independent                    Balance Overall balance assessment: No apparent balance deficits (not formally assessed) (walks with uneven gait pattern and extra time 2/2 chronic R hip pain. Pt reports mobility is at  baseline.)                           High level balance activites: Side stepping;Backward walking;Direction changes;Turns;Sudden stops;Head turns High Level Balance Comments: No overt instability or LOB noted with higher level balance tasks, just antalgic due to hip pain           ADL either performed or assessed with clinical judgement   ADL Overall ADL's : Needs assistance/impaired Eating/Feeding: Set up;Sitting   Grooming: Supervision/safety;Wash/dry face;Oral care;Standing   Upper Body Bathing: Set up;Sitting   Lower Body Bathing: Supervison/ safety;Sit to/from stand   Upper Body Dressing : Set up;Sitting   Lower Body Dressing: Supervision/safety;Sit to/from stand   Toilet Transfer: Supervision/safety;Ambulation   Toileting- Clothing Manipulation and Hygiene: Supervision/safety;Sit to/from stand   Tub/ Shower Transfer: Supervision/safety   Functional mobility during ADLs: Modified independent;Supervision/safety General ADL Comments: Discussed safety concerns and strategies for ADLs and functional mobility at home. Pt denies having in falls or bumping into furniture on left side. Pt reports he is mostly at home and when he does go out it is to places where he is familiar with the layout. Discussed scanning techniques for safety with functional mobility and IADLs. Pt does not drive at baseline     Vision Baseline Vision/History: Wears glasses Wears Glasses: Reading only Patient Visual Report: Other (comment) (baseline blurriness in left eye "that eye's always been bad") Vision Assessment?: Yes Eye Alignment:  Within Functional Limits Visual Fields: Other (comment) (small area of central tunnal vision) Additional Comments: depth perception appears intact. With therapist standing against wall in front of pt, pt reports being able to see therapist face but nothing in the area surrounding with L eye (right eye occluded). Pt reports no acute changes in R eye vision.  Pt also reports feeling that his L eye "does better" in the dark. Lights on during vision assessment     Perception     Praxis      Pertinent Vitals/Pain Pain Assessment: Faces Faces Pain Scale: Hurts little more Pain Location: R lateral thigh (IT band insertion area) Pain Descriptors / Indicators: Discomfort Pain Intervention(s): Monitored during session;Repositioned;Limited activity within patient's tolerance     Hand Dominance     Extremity/Trunk Assessment Upper Extremity Assessment Upper Extremity Assessment: Overall WFL for tasks assessed   Lower Extremity Assessment Lower Extremity Assessment: Defer to PT evaluation       Communication Communication Communication: No difficulties   Cognition Arousal/Alertness: Awake/alert Behavior During Therapy: WFL for tasks assessed/performed Overall Cognitive Status: Within Functional Limits for tasks assessed                                     General Comments       Exercises     Shoulder Instructions      Home Living Family/patient expects to be discharged to:: Private residence Living Arrangements: Alone   Type of Home: House Home Access: Level entry     Home Layout: One level               Home Equipment: None          Prior Functioning/Environment Level of Independence: Independent        Comments: Does not drive, uses transportation services. Reports no issues getting groceries, meds, etc.        OT Problem List: Decreased knowledge of precautions;Pain;Impaired vision/perception      OT Treatment/Interventions: Self-care/ADL training;Therapeutic activities;Visual/perceptual remediation/compensation;Patient/family education    OT Goals(Current goals can be found in the care plan section) Acute Rehab OT Goals Patient Stated Goal: back to baseline vision OT Goal Formulation: With patient Time For Goal Achievement: 03/19/21 Potential to Achieve Goals: Good ADL Goals Pt  Will Perform Grooming: Independently;with modified independence Pt Will Perform Tub/Shower Transfer: Independently;with modified independence;ambulating Additional ADL Goal #1: Pt will independently complete visual scanning while mobilizing in an unfamiliar space.  OT Frequency: Min 2X/week   Barriers to D/C:            Co-evaluation              AM-PAC OT "6 Clicks" Daily Activity     Outcome Measure Help from another person eating meals?: None Help from another person taking care of personal grooming?: None Help from another person toileting, which includes using toliet, bedpan, or urinal?: None Help from another person bathing (including washing, rinsing, drying)?: A Little Help from another person to put on and taking off regular upper body clothing?: None Help from another person to put on and taking off regular lower body clothing?: None 6 Click Score: 23   End of Session    Activity Tolerance: Patient tolerated treatment well Patient left: in chair;with call bell/phone within reach  OT Visit Diagnosis: Other abnormalities of gait and mobility (R26.89);Other (comment) (L eye vision loss)  Time: 0911-0930 OT Time Calculation (min): 19 min Charges:  OT General Charges $OT Visit: 1 Visit OT Evaluation $OT Eval Low Complexity: [place], OT Acute Rehabilitation Services Pager: [phone] Office: [phone]   Hortencia Pilar 03/05/2021, 10:29 AM

## 2021-03-05 NOTE — Consult Note (Signed)
NEUROLOGY CONSULTATION NOTE   Date of service: March 05, 2021 Patient Name: Antonio Powell MRN:  [id number] DOB:  1976/07/03 Reason for consult: "CRAO/Stroke Workup" _ _ _   _ __   _ __ _ _  __ __   _ __   __ _  History of Present Illness  Antonio Powell is a 45 y.o. male with PMH significant for  has a past medical history of Heart transplant recipient [facility]) and Hypertension. who presents with to the ED with a chief complaint of loss of vision in the left eye. Patient reports that on 23 April he had sudden onset of bright light in his left eye and in the left eye going completely dark. He had no pain to his eye or headache at that time. He had no associated dysphagia, change in speech, asymmetric weakness or numbness, or change in coordination. Patient does walk on his own at home and has had no difficulty with ambulation. He reports that the site never came back in his left eye.  He had to wait till he can get a ride so today was the first day he was able to go see a provider. He reports he saw his PCP who sent him to an ophthalmologist.  The ophthalmologist sent him to the ER. Patient reports is never had anything like this before. He does report that he has had full body aches starting today. This is happened before and it was secondary to an ear infection. He has taken Tylenol and muscle relaxers without relief. He denies any fevers. He reports any position changes make this pain worse. Rest makes it better. He request pain medication for this set of pains. He is vaccinated for COVID, has not been exposed to anybody with COVID, has not had any infectious symptoms. He was admitted for colitis about a week ago, but reports that his nausea vomiting has cleared up since then. He does continue to have soft stools, but reports that that has been going on for 2 years.  Patient has a history of heart transplant approximately 20 years ago, and heart valve replacements since then. He reports compliance  with his medication. Patient is an ESRD patient -Tuesday Thursday Saturday schedule with left AV fistula. Last dialysis was April 26, due for next dialysis April 28.  Dr. Joylene Grapes aware of nephro consult. Patient does smoke black and milds, does not drink any alcohol, and uses marijuana 3-4 times per week. Patient is vaccinated for COVID. Patient is full code.    He reports that he was at a friends house on Saturday 4/23, in the evening when he was leaving he suddenly had a flash of bright light in his Left eye. After the flash his eye went dark leaving only a small spot of tunnel vision which he could still see, though it was very fuzzy. He states he was hopeful that it would get better on its own so went to bed but when he woke up it was still present. He went to dialysis on Tues 4/26 and told the staff about his vision loss. They got an appointment for him to see his PCP on Wed. He saw his PCP who sent him to an Ophthalmologist that day who then sent him to the ED at [facility]. He was then transferred here for Stroke Work up. He reports that at the time he had no headache, no pain in his eye, and no other changes that he was aware of.  ROS   Constitutional Denies weight loss, fever and chills.   HEENT Denies changes in hearing.   Respiratory Denies SOB and cough.   CV Denies palpitations and CP   GI Denies abdominal pain, nausea, vomiting and diarrhea.   GU Denies dysuria and urinary frequency.   MSK Denies myalgia and reports some Right Hip pain   Skin Denies rash and pruritus.   Neurological Denies headache and syncope.   Psychiatric Denies recent changes in mood. Denies anxiety and depression.    Past History   Past Medical History:  Diagnosis Date  . Heart transplant recipient [facility])   . Hypertension    Past Surgical History:  Procedure Laterality Date  . APPENDECTOMY    . CARDIAC SURGERY    . CHOLECYSTECTOMY    . HERNIA REPAIR    . left ear     History reviewed. No  pertinent family history. Social History   Socioeconomic History  . Marital status: Single    Spouse name: Not on file  . Number of children: Not on file  . Years of education: Not on file  . Highest education level: Not on file  Occupational History  . Not on file  Tobacco Use  . Smoking status: Current Every Day Smoker    Packs/day: 0.50    Types: Cigarettes  . Smokeless tobacco: Never Used  . Tobacco comment: black and mild  Vaping Use  . Vaping Use: Never used  Substance and Sexual Activity  . Alcohol use: Yes    Comment: occ  . Drug use: Yes    Frequency: 2.0 times per week    Types: Marijuana  . Sexual activity: Not on file  Other Topics Concern  . Not on file  Social History Narrative  . Not on file   Social Determinants of Health   Financial Resource Strain: Not on file  Food Insecurity: Not on file  Transportation Needs: Not on file  Physical Activity: Not on file  Stress: Not on file  Social Connections: Not on file   Allergies  Allergen Reactions  . Nsaids     Cant take due to heart transplant  . Tolmetin Other (See Comments)    Cant take due to heart transplant  . Varenicline Other (See Comments)    Vivid dreams  . Phenergan [Promethazine Hcl] Anxiety    Medications   Medications Prior to Admission  Medication Sig Dispense Refill Last Dose  . acetaminophen (TYLENOL) 500 MG tablet Take 500 mg by mouth every 4 (four) hours as needed for moderate pain.   03/03/2021 at Unknown time  . allopurinol (ZYLOPRIM) 100 MG tablet Take 100 mg by mouth daily.   03/03/2021 at 0900  . amitriptyline (ELAVIL) 10 MG tablet Take 10 mg by mouth at bedtime.   03/03/2021 at Unknown time  . aspirin 81 MG chewable tablet Chew 81 mg by mouth daily.   03/03/2021 at Unknown time  . calcium acetate (PHOSLO) 667 MG capsule Take 2,001 mg by mouth 3 (three) times daily with meals. 2001 mg with meals and 1334 with snacks   03/03/2021 at Unknown time  . carvedilol (COREG) 6.25 MG  tablet Take 6.25 mg by mouth 2 (two) times daily.    03/03/2021 at 2100  . colestipol (COLESTID) 1 g tablet Take 1 tablet by mouth daily.   03/03/2021 at Unknown time  . cyclobenzaprine (FLEXERIL) 10 MG tablet Take 10 mg by mouth 2 (two) times daily.   03/03/2021 at Unknown time  .  diltiazem (CARDIZEM CD) 360 MG 24 hr capsule Take 360 mg by mouth in the morning and at bedtime.   03/03/2021 at Unknown time  . famotidine (PEPCID) 10 MG tablet Take 1 tablet (10 mg total) by mouth at bedtime. 30 tablet 2 03/03/2021 at Unknown time  . folic acid (FOLVITE) 1 MG tablet Take 1 mg by mouth daily.   03/03/2021 at Unknown time  . gabapentin (NEURONTIN) 100 MG capsule Take 100 mg by mouth 3 (three) times daily.   03/03/2021 at Unknown time  . hydrALAZINE (APRESOLINE) 50 MG tablet Take 50 mg by mouth in the morning and at bedtime.   03/03/2021 at Unknown time  . hyoscyamine (ANASPAZ) 0.125 MG TBDP disintergrating tablet Place 0.125 mg under the tongue every 6 (six) hours as needed for cramping.   Past Week at Unknown time  . imiquimod (ALDARA) 5 % cream Apply 1 application topically 3 (three) times a week.   03/03/2021 at Unknown time  . linaclotide (LINZESS) 145 MCG CAPS capsule Take 1 capsule (145 mcg total) by mouth daily before breakfast. Hold for diarrhea     . loperamide (IMODIUM) 2 MG capsule Take 2-4 mg by mouth every 6 (six) hours as needed.     . magnesium oxide (MAG-OX) 400 MG tablet Take 400 mg by mouth daily.   03/03/2021 at Unknown time  . melatonin 3 MG TABS tablet Take 1.5 tablets (4.5 mg total) by mouth at bedtime.  0 03/03/2021 at Unknown time  . mycophenolate (CELLCEPT) 250 MG capsule Take 500 mg by mouth 2 (two) times daily.   03/03/2021 at Unknown time  . ondansetron (ZOFRAN) 4 MG tablet Take 4 mg by mouth every 8 (eight) hours as needed for nausea or vomiting.   03/03/2021 at Unknown time  . ondansetron (ZOFRAN-ODT) 4 MG disintegrating tablet Take 4 mg by mouth every 8 (eight) hours as needed.     .  pantoprazole (PROTONIX) 40 MG tablet Take 1 tablet (40 mg total) by mouth 2 (two) times daily. 60 tablet 2 03/03/2021 at Unknown time  . pravastatin (PRAVACHOL) 40 MG tablet Take 40 mg by mouth daily.   03/03/2021 at Unknown time  . predniSONE (DELTASONE) 5 MG tablet Take 5 mg by mouth daily with breakfast.   03/03/2021 at Unknown time  . sirolimus (RAPAMUNE) 1 MG tablet Take 1 mg by mouth daily.   03/03/2021 at Unknown time  . sucralfate (CARAFATE) 1 GM/10ML suspension Take 10 mLs (1 g total) by mouth 4 (four) times daily -  with meals and at bedtime for 14 days. 560 mL 0   . tacrolimus (PROGRAF) 1 MG capsule Take 6 mg by mouth at bedtime. Take 5 mg every morning   03/03/2021 at Unknown time  . tacrolimus (PROGRAF) 5 MG capsule Take 5 mg by mouth daily. Take 5 mg every morning and 6 mg every evening   03/03/2021 at Unknown time  . calcitRIOL (ROCALTROL) 0.5 MCG capsule Resume As per outpatient nephrology. (Patient not taking: Reported on 02/20/2021)     . iron polysaccharides (NIFEREX) 150 MG capsule Take 150 mg by mouth daily. (Patient not taking: Reported on 02/20/2021)     . Multiple Vitamins-Minerals (MULTIVITAMIN ADULT) TABS Take 1 tablet by mouth daily. (Patient not taking: Reported on 02/20/2021)     . multivitamin (RENA-VIT) TABS tablet Take 1 tablet by mouth daily. (Patient not taking: Reported on 02/20/2021)        Vitals   Vitals:   03/04/21 2230  03/04/21 2239 03/04/21 2327 03/05/21 0744  BP: (!) 114/92 (!) 114/92 (!) 117/94 119/84  Pulse:  (!) 104 (!) 105 96  Resp: 19 19 18 18   Temp:   98.6 F (37 C) 97.9 F (36.6 C)  TempSrc:   Oral Oral  SpO2:  100% 99% 100%  Weight:      Height:         Body mass index is 18.83 kg/m.  Physical Exam   General: Laying comfortably in bedside chair; in no acute distress.  HENT: Normal oropharynx and mucosa. Normal external appearance of ears and nose.  Neck: Supple, no pain or tenderness  CV: No JVD. No peripheral edema.  Pulmonary: Symmetric  Chest rise. Normal respiratory effort.  Abdomen: Soft to touch, non-tender.  Ext: No cyanosis, edema, or deformity  Skin: No rash. Normal palpation of skin.   Musculoskeletal: Normal digits and nails by inspection. No clubbing.   Neurologic Examination  Mental status/Cognition: Alert, oriented to self, place, month and year, good attention. Able to follow all commands Speech/language: Fluent, comprehension intact, object naming intact, repetition intact.  Cranial nerves:   CN II Pupils equal and reactive to light, only minimal central vision present in Left eye   CN III,IV,VI EOM intact, no gaze preference or deviation, no nystagmus    CN V normal sensation in V1, V2, and V3 segments bilaterally    CN VII no asymmetry, no nasolabial fold flattening    CN VIII normal hearing to speech    CN IX & X normal palatal elevation, no uvular deviation    CN XI 5/5 head turn and 5/5 shoulder shrug bilaterally    CN XII midline tongue protrusion    Motor: does have total body weakness that is proportionate must likely due to moderate malnutrition that he has. Reports no sudden decrease from baseline. RUE: 4/5  LUE: 4/5 RLE: 4/5  LLE: 4/5 Muscle bulk: reduced, tone normal, no pronator drift or tremor present   Sensation:  Light touch Intact bilaterally   Pin prick    Temperature    Vibration   Proprioception    Coordination/Complex Motor:  - Finger to Nose intact bilaterally - Heel to shin intact bilaterally - Rapid alternating movement intact bilaterally - Gait: deferred  Labs   CBC:  Recent Labs  Lab 03/04/21 1623 03/05/21 0314  WBC 9.3 8.7  NEUTROABS 6.3  --   HGB 8.8* 8.3*  HCT 25.5* 23.2*  MCV 87.0 83.2  PLT 156 132*    Basic Metabolic Panel:  Lab Results  Component Value Date   NA 133 (L) 03/05/2021   K 4.6 03/05/2021   CO2 25 03/05/2021   GLUCOSE 96 03/05/2021   BUN 37 (H) 03/05/2021   CREATININE 10.69 (H) 03/05/2021   CALCIUM 9.2 03/05/2021   GFRNONAA 6 (L)  03/05/2021   GFRAA 13 (L) 08/25/2018   Lipid Panel:  Lab Results  Component Value Date   LDLCALC 54 03/05/2021   HgbA1c:  Lab Results  Component Value Date   HGBA1C 5.3 03/05/2021   Urine Drug Screen: No results found for: LABOPIA, COCAINSCRNUR, LABBENZ, AMPHETMU, THCU, LABBARB  Alcohol Level     Component Value Date/Time   ETH <10 03/04/2021 1623    CT Head without contrast: No evidence of acute intracranial abnormality. A small chronic infarct is questioned within the right cerebellar hemisphere.   CT angio Head and Neck with contrast: No large vessel occlusion, significant stenosis, or aneurysm in the head and  neck. Emphysema  MRI Brain- ordered  2D Echo with bubble study- ordered  Impression   Mikolaj A Devino is a 45 y.o. male with PMH significant for HTN, HLD, Heart transplant at age 49, ESRD with HD on T TH S. His neurologic examination is notable for only vision changes in his Left eye. He is otherwise neurologically intact. Due to abandoned pericardial leads he cannot have an MRI done. Given his event happened on Saturday and CTA from yesterday showed no other changes no further head imaging needs to be done. Will stop his Aspirin and begin Plavix, will not do a loading dose. After 2D Echo is done no further neurological work up will need to be done.  Recommendations  Stroke/TIA Workup  - CTA- no large vessel occlusion or significant stenosis - TTE w/ bubble- ordered - Already on Pravastatin and LDL within goal 54 - Change from 81 mg Aspirin to Plavix - q4 hr neuro checks - STAT head CT for any change in neuro exam - Tele - PT/OT recommend no follow up - Stroke education - Amb referral to neurology upon discharge   Neurology will sign off once the Echo is read   ______________________________________________________________________   Fatima Sanger MD Resident

## 2021-03-05 NOTE — Consult Note (Signed)
[facility] Nephrology Consult Note: Reason for Consult: To manage dialysis and dialysis related needs Referring Physician: Dr.Pahwani, Einar Grad  HPI: Antonio Powell is an 45 y.o. male with history of hypertension, nonischemic cardiomyopathy status post BiVAD and heart transplant in 12/2019, COVID-pneumonia requiring mechanical intubation, ESRD on HD TTS at [facility] presented with vision loss on left eye, seen as a consultation for the management of dialysis. Patient reported that he has vision issues on his left eyes for last few days and was seen by ophthalmologist.  Bonne Dolores some eyedrops and then sent to ER for further evaluation.  He was recently admitted for colitis. In the ER he was hemodynamically stable.  CT scan of head without any acute finding.  The angiogram showed no significant stenosis or occlusion. For ESRD he goes to [facility] for about 2 years.  He has left upper extremity AV fistula for the access.  He reports usual dialysis time 3 hours 45 minutes and dry weight is around 60 kg, no use of heparin per patient. Currently his blood pressure is 119/84, in room air.  The labs showed potassium 4.6, BUN 37.  Past Medical History:  Diagnosis Date  . Heart transplant recipient [facility])   . Hypertension     Past Surgical History:  Procedure Laterality Date  . APPENDECTOMY    . CARDIAC SURGERY    . CHOLECYSTECTOMY    . HERNIA REPAIR    . left ear      History reviewed. No pertinent family history.  Social History:  reports that he has been smoking cigarettes. He has been smoking about 0.50 packs per day. He has never used smokeless tobacco. He reports current alcohol use. He reports current drug use. Frequency: 2.00 times per week. Drug: Marijuana.  Allergies:  Allergies  Allergen Reactions  . Nsaids     Cant take due to heart transplant  . Tolmetin Other (See Comments)    Cant take due to heart transplant  . Varenicline Other (See Comments)    Vivid  dreams  . Phenergan [Promethazine Hcl] Anxiety    Medications: I have reviewed the patient's current medications.   Results for orders placed or performed during the hospital encounter of 03/04/21 (from the past 48 hour(s))  Resp Panel by RT-PCR (Flu A&B, Covid) Nasopharyngeal Swab     Status: None   Collection Time: 03/04/21  4:02 PM   Specimen: Nasopharyngeal Swab; Nasopharyngeal(NP) swabs in vial transport medium  Result Value Ref Range   SARS Coronavirus 2 by RT PCR NEGATIVE NEGATIVE    Comment: (NOTE) SARS-CoV-2 target nucleic acids are NOT DETECTED.  The SARS-CoV-2 RNA is generally detectable in upper respiratory specimens during the acute phase of infection. The lowest concentration of SARS-CoV-2 viral copies this assay can detect is 138 copies/mL. A negative result does not preclude SARS-Cov-2 infection and should not be used as the sole basis for treatment or other patient management decisions. A negative result may occur with  improper specimen collection/handling, submission of specimen other than nasopharyngeal swab, presence of viral mutation(s) within the areas targeted by this assay, and inadequate number of viral copies(<138 copies/mL). A negative result must be combined with clinical observations, patient history, and epidemiological information. The expected result is Negative.  Fact Sheet for Patients:  EntrepreneurPulse.com.au  Fact Sheet for Healthcare Providers:  IncredibleEmployment.be  This test is no t yet approved or cleared by the [place] FDA and  has been authorized for detection and/or diagnosis of SARS-CoV-2  by FDA under an Emergency Use Authorization (EUA). This EUA will remain  in effect (meaning this test can be used) for the duration of the COVID-19 declaration under Section 564(b)(1) of the Act, 21 U.S.C.section 360bbb-3(b)(1), unless the authorization is terminated  or revoked sooner.        Influenza A by PCR NEGATIVE NEGATIVE   Influenza B by PCR NEGATIVE NEGATIVE    Comment: (NOTE) The Xpert Xpress SARS-CoV-2/FLU/RSV plus assay is intended as an aid in the diagnosis of influenza from Nasopharyngeal swab specimens and should not be used as a sole basis for treatment. Nasal washings and aspirates are unacceptable for Xpert Xpress SARS-CoV-2/FLU/RSV testing.  Fact Sheet for Patients: EntrepreneurPulse.com.au  Fact Sheet for Healthcare Providers: IncredibleEmployment.be  This test is not yet approved or cleared by the [place] FDA and has been authorized for detection and/or diagnosis of SARS-CoV-2 by FDA under an Emergency Use Authorization (EUA). This EUA will remain in effect (meaning this test can be used) for the duration of the COVID-19 declaration under Section 564(b)(1) of the Act, 21 U.S.C. section 360bbb-3(b)(1), unless the authorization is terminated or revoked.  Performed at [facility], [address], [place], [place] [address]   Ethanol     Status: None   Collection Time: 03/04/21  4:23 PM  Result Value Ref Range   Alcohol, Ethyl (B) <10 <10 mg/dL    Comment: (NOTE) Lowest detectable limit for serum alcohol is 10 mg/dL.  For medical purposes only. Performed at [facility], [address] [place], [place] [address]   Protime-INR     Status: None   Collection Time: 03/04/21  4:23 PM  Result Value Ref Range   Prothrombin Time 14.2 11.4 - 15.2 seconds   INR 1.1 0.8 - 1.2    Comment: (NOTE) INR goal varies based on device and disease states. Performed at [facility], [address] [place], [place] [address]   APTT     Status: None   Collection Time: 03/04/21  4:23 PM  Result Value Ref Range   aPTT 35 24 - 36 seconds    Comment: Performed at [facility], [address], [place], [place] [address]  CBC     Status: Abnormal   Collection Time: 03/04/21  4:23 PM  Result Value Ref Range   WBC 9.3 4.0 - 10.5  K/uL   RBC 2.93 (L) 4.22 - 5.81 MIL/uL   Hemoglobin 8.8 (L) 13.0 - 17.0 g/dL   HCT 25.5 (L) 39.0 - 52.0 %   MCV 87.0 80.0 - 100.0 fL   MCH 30.0 26.0 - 34.0 pg   MCHC 34.5 30.0 - 36.0 g/dL   RDW 15.1 11.5 - 15.5 %   Platelets 156 150 - 400 K/uL   nRBC 0.0 0.0 - 0.2 %    Comment: Performed at [facility], [address] [place], [place] [address]  Differential     Status: None   Collection Time: 03/04/21  4:23 PM  Result Value Ref Range   Neutrophils Relative % 68 %   Neutro Abs 6.3 1.7 - 7.7 K/uL   Lymphocytes Relative 21 %   Lymphs Abs 1.9 0.7 - 4.0 K/uL   Monocytes Relative 9 %   Monocytes Absolute 0.8 0.1 - 1.0 K/uL   Eosinophils Relative 2 %   Eosinophils Absolute 0.2 0.0 - 0.5 K/uL   Basophils Relative 0 %   Basophils Absolute 0.0 0.0 - 0.1 K/uL   Immature Granulocytes 0 %  Abs Immature Granulocytes 0.04 0.00 - 0.07 K/uL    Comment: Performed at [facility], [address], [place], [place] [address]  Comprehensive metabolic panel     Status: Abnormal   Collection Time: 03/04/21  4:23 PM  Result Value Ref Range   Sodium 134 (L) 135 - 145 mmol/L   Potassium 4.3 3.5 - 5.1 mmol/L   Chloride 95 (L) 98 - 111 mmol/L   CO2 25 22 - 32 mmol/L   Glucose, Bld 97 70 - 99 mg/dL    Comment: Glucose reference range applies only to samples taken after fasting for at least 8 hours.   BUN 35 (H) 6 - 20 mg/dL   Creatinine, Ser 9.59 (H) 0.61 - 1.24 mg/dL   Calcium 9.6 8.9 - 10.3 mg/dL   Total Protein 7.3 6.5 - 8.1 g/dL   Albumin 2.8 (L) 3.5 - 5.0 g/dL   AST 13 (L) 15 - 41 U/L   ALT 17 0 - 44 U/L   Alkaline Phosphatase 59 38 - 126 U/L   Total Bilirubin 0.4 0.3 - 1.2 mg/dL   GFR, Estimated 6 (L) >60 mL/min    Comment: (NOTE) Calculated using the CKD-EPI Creatinine Equation (2021)    Anion gap 14 5 - 15    Comment: Performed at [facility], [address], [place], [place] [address]  Hemoglobin A1c     Status: None   Collection Time: 03/05/21  3:14 AM  Result Value Ref Range    Hgb A1c MFr Bld 5.3 4.8 - 5.6 %    Comment: (NOTE) Pre diabetes:          5.7%-6.4%  Diabetes:              >6.4%  Glycemic control for   <7.0% adults with diabetes    Mean Plasma Glucose 105.41 mg/dL    Comment: Performed at [facility], [place] [address] [place], [place] [address]  Lipid panel     Status: None   Collection Time: 03/05/21  3:14 AM  Result Value Ref Range   Cholesterol 138 0 - 200 mg/dL   Triglycerides 64 <150 mg/dL   HDL 71 >40 mg/dL   Total CHOL/HDL Ratio 1.9 RATIO   VLDL 13 0 - 40 mg/dL   LDL Cholesterol 54 0 - 99 mg/dL    Comment:        Total Cholesterol/HDL:CHD Risk Coronary Heart Disease Risk Table                     Men   Women  1/2 Average Risk   3.4   3.3  Average Risk       5.0   4.4  2 X Average Risk   9.6   7.1  3 X Average Risk  23.4   11.0        Use the calculated Patient Ratio above and the CHD Risk Table to determine the patient's CHD Risk.        ATP III CLASSIFICATION (LDL):  <100     mg/dL   Optimal  100-129  mg/dL   Near or Above                    Optimal  130-159  mg/dL   Borderline  160-189  mg/dL   High  >190     mg/dL   Very High Performed at [place] [address], [place], [place] [address]  Comprehensive metabolic panel     Status: Abnormal   Collection Time: 03/05/21  3:14 AM  Result Value Ref Range   Sodium 133 (L) 135 - 145 mmol/L   Potassium 4.6 3.5 - 5.1 mmol/L   Chloride 93 (L) 98 - 111 mmol/L   CO2 25 22 - 32 mmol/L   Glucose, Bld 96 70 - 99 mg/dL    Comment: Glucose reference range applies only to samples taken after fasting for at least 8 hours.   BUN 37 (H) 6 - 20 mg/dL   Creatinine, Ser 10.69 (H) 0.61 - 1.24 mg/dL   Calcium 9.2 8.9 - 10.3 mg/dL   Total Protein 5.9 (L) 6.5 - 8.1 g/dL   Albumin 2.3 (L) 3.5 - 5.0 g/dL   AST 11 (L) 15 - 41 U/L   ALT 14 0 - 44 U/L   Alkaline Phosphatase 49 38 - 126 U/L   Total Bilirubin 0.4 0.3 - 1.2 mg/dL   GFR, Estimated 6 (L) >60 mL/min    Comment:  (NOTE) Calculated using the CKD-EPI Creatinine Equation (2021)    Anion gap 15 5 - 15    Comment: Performed at [place] [address], [place], [place] [address]  CBC     Status: Abnormal   Collection Time: 03/05/21  3:14 AM  Result Value Ref Range   WBC 8.7 4.0 - 10.5 K/uL   RBC 2.79 (L) 4.22 - 5.81 MIL/uL   Hemoglobin 8.3 (L) 13.0 - 17.0 g/dL   HCT 23.2 (L) 39.0 - 52.0 %   MCV 83.2 80.0 - 100.0 fL   MCH 29.7 26.0 - 34.0 pg   MCHC 35.8 30.0 - 36.0 g/dL   RDW 14.6 11.5 - 15.5 %   Platelets 132 (L) 150 - 400 K/uL   nRBC 0.0 0.0 - 0.2 %    Comment: Performed at [facility], [place] [address], [place], [place] [address]    CT Angio Head W or [place] Contrast  Result Date: 03/04/2021 CLINICAL DATA:  Left eye vision loss for 5 days. EXAM: CT ANGIOGRAPHY HEAD AND NECK TECHNIQUE: Multidetector CT imaging of the head and neck was performed using the standard protocol during bolus administration of intravenous contrast. Multiplanar CT image reconstructions and MIPs were obtained to evaluate the vascular anatomy. Carotid stenosis measurements (when applicable) are obtained utilizing NASCET criteria, using the distal internal carotid diameter as the denominator. CONTRAST:  22mL OMNIPAQUE IOHEXOL 350 MG/ML SOLN COMPARISON:  None. FINDINGS: CTA NECK FINDINGS Aortic arch: Normal variant aortic arch branching pattern with common origin of the brachiocephalic and left common carotid arteries. Right carotid system: Patent without evidence of stenosis, dissection, or significant atherosclerosis. Left carotid system: Patent without evidence of stenosis, dissection, or significant atherosclerosis. Vertebral arteries: Patent without evidence of stenosis, dissection, or significant atherosclerosis. Skeleton: Edentulous. Moderate multilevel disc space narrowing in the cervical spine. Other neck: No evidence of cervical lymphadenopathy or mass. Upper chest: Mild centrilobular emphysema in the lung apices  with incompletely imaged asymmetric subpleural reticulation in the right upper lobe which may reflect scarring/mild fibrosis. Review of the MIP images confirms the above findings CTA HEAD FINDINGS Anterior circulation: The internal carotid arteries are widely patent from skull base to carotid termini. ACAs and MCAs are patent without evidence of a proximal branch occlusion or significant proximal stenosis. No aneurysm is identified. Posterior circulation: The intracranial vertebral arteries are widely patent to the basilar. Patent PICA and SCA origins are seen bilaterally. The  basilar artery is widely patent. Posterior communicating arteries are diminutive or absent. Both PCAs are patent without evidence of a significant proximal stenosis. No aneurysm is identified. Venous sinuses: Patent. Anatomic variants: None. Review of the MIP images confirms the above findings IMPRESSION: 1. No large vessel occlusion, significant stenosis, or aneurysm in the head and neck. 2. Emphysema (ICD10-J43.9). Electronically Signed   By: Logan Bores M.D.   On: 03/04/2021 17:39   CT Head Wo Contrast  Result Date: 03/04/2021 CLINICAL DATA:  Neuro deficit, acute, stroke suspected. Additional history provided: Vision loss in left eye 5 days. EXAM: CT HEAD WITHOUT CONTRAST TECHNIQUE: Contiguous axial images were obtained from the base of the skull through the vertex without intravenous contrast. COMPARISON:  Report from head CT 08/06/2007 (images currently unavailable). FINDINGS: Brain: Cerebral volume is normal for age. A small chronic infarct is questioned within the right cerebellar hemisphere (for instance as seen on series 4, image 52). There is no acute intracranial hemorrhage. No demarcated cortical infarct. No extra-axial fluid collection. No evidence of intracranial mass. No midline shift. Partially empty sella turcica. Vascular: No hyperdense vessel. Skull: Normal. Negative for fracture or focal lesion. Sinuses/Orbits:a  Visualized orbits show no acute finding. No significant paranasal sinus disease at the imaged levels. IMPRESSION: No evidence of acute intracranial abnormality. A small chronic infarct is questioned within the right cerebellar hemisphere. Electronically Signed   By: Kellie Simmering DO   On: 03/04/2021 15:42   CT Angio Neck W and/or Wo Contrast  Result Date: 03/04/2021 CLINICAL DATA:  Left eye vision loss for 5 days. EXAM: CT ANGIOGRAPHY HEAD AND NECK TECHNIQUE: Multidetector CT imaging of the head and neck was performed using the standard protocol during bolus administration of intravenous contrast. Multiplanar CT image reconstructions and MIPs were obtained to evaluate the vascular anatomy. Carotid stenosis measurements (when applicable) are obtained utilizing NASCET criteria, using the distal internal carotid diameter as the denominator. CONTRAST:  83mL OMNIPAQUE IOHEXOL 350 MG/ML SOLN COMPARISON:  None. FINDINGS: CTA NECK FINDINGS Aortic arch: Normal variant aortic arch branching pattern with common origin of the brachiocephalic and left common carotid arteries. Right carotid system: Patent without evidence of stenosis, dissection, or significant atherosclerosis. Left carotid system: Patent without evidence of stenosis, dissection, or significant atherosclerosis. Vertebral arteries: Patent without evidence of stenosis, dissection, or significant atherosclerosis. Skeleton: Edentulous. Moderate multilevel disc space narrowing in the cervical spine. Other neck: No evidence of cervical lymphadenopathy or mass. Upper chest: Mild centrilobular emphysema in the lung apices with incompletely imaged asymmetric subpleural reticulation in the right upper lobe which may reflect scarring/mild fibrosis. Review of the MIP images confirms the above findings CTA HEAD FINDINGS Anterior circulation: The internal carotid arteries are widely patent from skull base to carotid termini. ACAs and MCAs are patent without evidence of a  proximal branch occlusion or significant proximal stenosis. No aneurysm is identified. Posterior circulation: The intracranial vertebral arteries are widely patent to the basilar. Patent PICA and SCA origins are seen bilaterally. The basilar artery is widely patent. Posterior communicating arteries are diminutive or absent. Both PCAs are patent without evidence of a significant proximal stenosis. No aneurysm is identified. Venous sinuses: Patent. Anatomic variants: None. Review of the MIP images confirms the above findings IMPRESSION: 1. No large vessel occlusion, significant stenosis, or aneurysm in the head and neck. 2. Emphysema (ICD10-J43.9). Electronically Signed   By: Logan Bores M.D.   On: 03/04/2021 17:39    ROS:  Blood pressure 119/84, pulse 96, temperature  97.9 F (36.6 C), temperature source Oral, resp. rate 18, height 5\' 11"  (1.803 m), weight 61.2 kg, SpO2 100 %. Gen: NAD, comfortable Respiratory: Clear bilateral, no wheezing or crackle Cardiovascular: Regular rate rhythm S1-S2 normal, no rubs GI: Abdomen soft, nontender, nondistended Extremities, no cyanosis or clubbing, no edema Skin: No rash or ulcer Neurology: Alert, awake, following commands, oriented Dialysis Access: Left upper extremity AV fistula has good thrill and bruit.  Assessment/Plan:  #Left eye vision changes with TIA work-up: Seen by stroke team.  Getting MRI without contrast.  CT scan with no acute finding.  # ESRD: TTS schedule at [facility]: Plan for regular dialysis today, 1 L UF, no heparin.  AV fistula for the access.  # Hypertension: Resume home medication.  Volume status acceptable.  Monitor blood pressure.  # Anemia of ESRD: Hemoglobin below goal, need record for iron and ESA treatment from outpatient.  Patient will likely be discharged next 1 to 2 days.  Monitor hemoglobin.  # Metabolic Bone Disease: Continue PhosLo.  Monitor electrolytes.  Thank you for the consult.  We will follow with you.  Kenyatte Gruber  Tanna Furry 03/05/2021, 11:58 AM

## 2021-03-05 NOTE — Progress Notes (Signed)
SLP Cancellation Note  Patient Details Name: Antonio Powell MRN: [id number] DOB: July 24, 1976   Cancelled treatment:       Reason Eval/Treat Not Completed: SLP screened, no needs identified, will sign off. CT negative, no cognitive impairment noted by PT/OT. Will sign off   Daric Koren, Katherene Ponto 03/05/2021, 1:34 PM

## 2021-03-05 NOTE — Evaluation (Signed)
Physical Therapy Evaluation & Discharge Patient Details Name: Antonio Powell MRN: [id number] DOB: 1976-09-22 Today's Date: 03/05/2021   History of Present Illness  Pt is a 45 y.o. male admitted 03/04/21 with L eye vision loss; pt reports sudden onset of bright eye on 02/28/21 then vision going completely dark, but today was first day he could get a ride for medical help. Head CT negative for acute injury; chronic R cerebellar infarct. Awaiting MRI. PMH includes HTN, ESRD (HD TTS), heart transplant (age 31), pacemaker.    Clinical Impression  Patient evaluated by Physical Therapy with no further acute PT needs identified. PTA, pt independent, lives alone, does not drive but uses transportation services. Today, pt independent with transfers, ambulation and ADL tasks; mild antalgic gait pattern due to c/o R hip soreness. Pt reports no change in L vision loss; educ on scanning environment for obstacles during mobility. All education has been completed and the patient has no further questions. Acute PT is signing off. Thank you for this referral.    Follow Up Recommendations No PT follow up    Equipment Recommendations  None recommended by PT    Recommendations for Other Services       Precautions / Restrictions Precautions Precautions: Other (comment) Precaution Comments: L eye vision loss (reports chronic blurred vision "from astigmatism" prior to vision loss) Restrictions Weight Bearing Restrictions: No      Mobility  Bed Mobility Overal bed mobility: Independent                  Transfers Overall transfer level: Independent                  Ambulation/Gait Ambulation/Gait assistance: Modified independent (Device/Increase time) Gait Distance (Feet): 40 Feet Assistive device: None Gait Pattern/deviations: Step-through pattern;Decreased stride length;Antalgic Gait velocity: Decreased   General Gait Details: Slow, antalgic, but steady gait without DME; pt reports  R hip pain; mod indep due to slow gait. Pt declined further distance due to arrival of breakfast  Stairs            Wheelchair Mobility    Modified Rankin (Stroke Patients Only) Modified Rankin (Stroke Patients Only) Pre-Morbid Rankin Score: No symptoms Modified Rankin: No significant disability     Balance Overall balance assessment: No apparent balance deficits (not formally assessed)                           High level balance activites: Side stepping;Backward walking;Direction changes;Turns;Sudden stops;Head turns High Level Balance Comments: No overt instability or LOB noted with higher level balance tasks, just antalgic due to hip pain             Pertinent Vitals/Pain Pain Assessment: Faces Faces Pain Scale: Hurts little more Pain Location: R lateral thigh (IT band insertion area) Pain Descriptors / Indicators: Discomfort Pain Intervention(s): Limited activity within patient's tolerance;Heat applied    Home Living Family/patient expects to be discharged to:: Private residence Living Arrangements: Alone   Type of Home: House Home Access: Level entry     Home Layout: One level Home Equipment: None      Prior Function Level of Independence: Independent         Comments: Does not drive, uses transportation services. Reports no issues getting groceries, meds, etc.     Hand Dominance        Extremity/Trunk Assessment   Upper Extremity Assessment Upper Extremity Assessment: Overall WFL for tasks assessed  Lower Extremity Assessment Lower Extremity Assessment: Overall WFL for tasks assessed (noted 3-4/5 knee flexion strength; otherwise 4-5/5 BLEs)       Communication   Communication: No difficulties  Cognition Arousal/Alertness: Awake/alert Behavior During Therapy: WFL for tasks assessed/performed Overall Cognitive Status: Within Functional Limits for tasks assessed                                         General Comments      Exercises     Assessment/Plan    PT Assessment Patent does not need any further PT services  PT Problem List         PT Treatment Interventions      PT Goals (Current goals can be found in the Care Plan section)  Acute Rehab PT Goals PT Goal Formulation: All assessment and education complete, DC therapy    Frequency     Barriers to discharge        Co-evaluation               AM-PAC PT "6 Clicks" Mobility  Outcome Measure Help needed turning from your back to your side while in a flat bed without using bedrails?: None Help needed moving from lying on your back to sitting on the side of a flat bed without using bedrails?: None Help needed moving to and from a bed to a chair (including a wheelchair)?: None Help needed standing up from a chair using your arms (e.g., wheelchair or bedside chair)?: None Help needed to walk in hospital room?: None Help needed climbing 3-5 steps with a railing? : None 6 Click Score: 24    End of Session   Activity Tolerance: Patient tolerated treatment well Patient left: in chair;with call bell/phone within reach Nurse Communication: Mobility status PT Visit Diagnosis: Pain Pain - Right/Left: Right Pain - part of body: Hip    Time: 6578-4696 PT Time Calculation (min) (ACUTE ONLY): 10 min   Charges:   PT Evaluation $PT Eval Low Complexity: 1 Low     Mabeline Caras, PT, DPT Acute Rehabilitation Services  Pager [phone] Office [place] 03/05/2021, 9:08 AM

## 2021-03-05 NOTE — Progress Notes (Signed)
Resume home prednisone 5mg  qday as part of home heart transplant meds per Dr. Doristine Bosworth.  Onnie Boer, PharmD, BCIDP, AAHIVP, CPP Infectious Disease Pharmacist 03/05/2021 9:52 AM

## 2021-03-05 NOTE — Progress Notes (Incomplete)
  Echocardiogram 2D Echocardiogram has been performed.  Cammy Brochure 03/05/2021, 1:04 PM

## 2021-03-06 DIAGNOSIS — I639 Cerebral infarction, unspecified: Secondary | ICD-10-CM | POA: Diagnosis not present

## 2021-03-06 DIAGNOSIS — H349 Unspecified retinal vascular occlusion: Secondary | ICD-10-CM | POA: Diagnosis not present

## 2021-03-06 MED ORDER — CHLORHEXIDINE GLUCONATE CLOTH 2 % EX PADS: 6.0000 | MEDICATED_PAD | Freq: Every day | CUTANEOUS | Status: DC

## 2021-03-06 NOTE — Progress Notes (Addendum)
STROKE TEAM PROGRESS NOTE   INTERVAL HISTORY No one is at the bedside.  Patient awake, alert, NAD. Patient states he still has significant vision loss in the left eye and is only able to see through a narrow tunnel.  MRI scan cannot be obtained due to M-End in pericardial leads.  CT scan of the head and CT angiogram of brain and neck are both unremarkable.  LDL cholesterol is 54 mg percent.  Hemoglobin A1c is 5.3. Vitals:   03/05/21 1831 03/05/21 1834 03/05/21 2126 03/06/21 0619  BP: 103/78 103/78 99/69 98/78   Pulse: 91 94 89 86  Resp: 18 18 16 14   Temp:  98.3 F (36.8 C) 98.2 F (36.8 C) 98.2 F (36.8 C)  TempSrc:  Oral Oral Oral  SpO2: 98% 98% 98% 98%  Weight:      Height:       CBC:  Recent Labs  Lab 03/04/21 1623 03/05/21 0314  WBC 9.3 8.7  NEUTROABS 6.3  --   HGB 8.8* 8.3*  HCT 25.5* 23.2*  MCV 87.0 83.2  PLT 156 268*   Basic Metabolic Panel:  Recent Labs  Lab 03/04/21 1623 03/05/21 0314  NA 134* 133*  K 4.3 4.6  CL 95* 93*  CO2 25 25  GLUCOSE 97 96  BUN 35* 37*  CREATININE 9.59* 10.69*  CALCIUM 9.6 9.2   Lipid Panel:  Recent Labs  Lab 03/05/21 0314  CHOL 138  TRIG 64  HDL 71  CHOLHDL 1.9  VLDL 13  LDLCALC 54   HgbA1c:  Recent Labs  Lab 03/05/21 0314  HGBA1C 5.3    Alcohol Level  Recent Labs  Lab 03/04/21 1623  ETH <10    IMAGING past 24 hours ECHOCARDIOGRAM COMPLETE  Result Date: 03/05/2021    ECHOCARDIOGRAM REPORT   Patient Name:   Antonio Powell Date of Exam: 03/05/2021 Medical Rec #:  [id number]          Height:       71.0 in Accession #:    [id number]         Weight:       135.0 lb Date of Birth:  1976-03-16           BSA:          1.784 m Patient Age:    45 years           BP:           117/94 mmHg Patient Gender: M                  HR:           105 bpm. Exam Location:  Inpatient Procedure: 2D Echo, Cardiac Doppler and Color Doppler Indications:     Stroke  History:         Patient has no prior history of Echocardiogram  examinations.                  Heart transplant, Pacemaker and Prior Cardiac Surgery; Risk                  Factors:Hypertension and Current Smoker. Heart Transplant                  12/25/1999                  Freestyle Aortic Valve/Root 27 mm 01/2019.  Aortic Valve: 27 mm Medtronic homograft valve is present in the                  aortic position. Procedure Date: 01/2019.  Sonographer:     Cammy Brochure Referring Phys:  [id number] [place] Diagnosing Phys: Eleonore Chiquito MD IMPRESSIONS  1. 27 mm Freestyle AoV/Root replacement 01/2019. 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 There is a also a small mobile mass under the NCC/LCC best seen in the PLAX that measures 0.7 cm x 0.7 cm. 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 The echo report from [facility] was reviewed via [place] (12/08/2020), where they metion possible prosthetic valve dehisensce, but this is not a stented valve. Suspect, this is related to root abscess based on appearance. TEE was not performed at [facility]. Would recommend TEE or gated cardiac CT for further clarification. Would also recommend blood cultures. The aortic valve has been repaired/replaced. Aortic valve regurgitation is not visualized. There is a 27 mm Medtronic homograft valve present in the aortic position. Procedure Date: 01/2019.  2. Left ventricular ejection fraction, by estimation, is 50 to 55%. The left ventricle has low normal function. The left ventricle has no regional wall motion abnormalities. There is moderate concentric left ventricular hypertrophy. Left ventricular diastolic function could not be evaluated.  3. Right ventricular systolic function is mildly reduced. The right ventricular size is normal. There is moderately elevated pulmonary artery systolic pressure. The  estimated right ventricular systolic pressure is 13.0 mmHg.  4. Biatrial enlargement is present likely due to prior heart transplant in 2001 that was likely performed usring a biatrial technique. Left atrial size was severely dilated.  5. Right atrial size was severely dilated.  6. The mitral valve is grossly normal. Moderate mitral valve regurgitation.  7. The inferior vena cava is dilated in size with >50% respiratory variability, suggesting right atrial pressure of 8 mmHg.  8. Prior aortic root/AoV replacement 27 mm fresstyle. Concerns for aortic root abscess detailed in the report. Aortic root/ascending aorta has been repaired/replaced. FINDINGS  Left Ventricle: Left ventricular ejection fraction, by estimation, is 50 to 55%. The left ventricle has low normal function. The left ventricle has no regional wall motion abnormalities. The left ventricular internal cavity size was normal in size. There is moderate concentric left ventricular hypertrophy. Abnormal (paradoxical) septal motion consistent with post-operative status. Left ventricular diastolic function could not be evaluated due to abnormal septal motion. Left ventricular diastolic function could not be evaluated. Right Ventricle: The right ventricular size is normal. No increase in right ventricular wall thickness. Right ventricular systolic function is mildly reduced. There is moderately elevated pulmonary artery systolic pressure. The tricuspid regurgitant velocity is 3.25 m/s, and with an assumed right atrial pressure of 8 mmHg, the estimated right ventricular systolic pressure is 86.5 mmHg. Left Atrium: Biatrial enlargement is present likely due to prior heart transplant in 2001 that was likely performed usring a biatrial technique. Left atrial size was severely dilated. Right Atrium: Right atrial size was severely dilated. Pericardium: Trivial pericardial effusion is present. Mitral Valve: The mitral valve is grossly normal. There is mild thickening  of the mitral valve leaflet(s). Moderate mitral valve regurgitation. MV peak gradient, 19.5 mmHg. The mean mitral valve gradient is 7.0 mmHg. Tricuspid Valve: The tricuspid valve is grossly normal. Tricuspid valve regurgitation is mild . No  evidence of tricuspid stenosis. Aortic Valve: 27 mm Freestyle AoV/Root replacement 01/2019. 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 There is a also a small mobile mass under the NCC/LCC best seen in the PLAX that measures 0.7 cm x 0.7 cm. 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 The echo report from [facility] was reviewed via [place] (12/08/2020), where they metion possible prosthetic valve dehisensce, but this is not a stented valve. Suspect, this is related to root abscess based on appearance. TEE was not performed at [facility]. Would recommend TEE or gated cardiac CT for further clarification. Would also recommend blood cultures. The aortic valve has been repaired/replaced. Aortic valve regurgitation is not visualized. Aortic valve mean gradient measures 12.0 mmHg. Aortic valve peak gradient measures 18.1 mmHg. Aortic valve area, by VTI measures 1.32 cm. There is a 27 mm Medtronic homograft valve present in the aortic position. Procedure Date: 01/2019. Pulmonic Valve: The pulmonic valve was grossly normal. Pulmonic valve regurgitation is mild. No evidence of pulmonic stenosis. Aorta: Prior aortic root/AoV replacement 27 mm fresstyle. Concerns for aortic root abscess detailed in the report. The aortic root/ascending aorta has been repaired/replaced. Venous: The inferior vena cava is dilated in size with greater than 50% respiratory variability, suggesting right atrial pressure of 8 mmHg. IAS/Shunts: The atrial septum is grossly normal.  LEFT VENTRICLE PLAX 2D LVIDd:         4.20 cm LVIDs:          3.00 cm LV PW:         1.20 cm LV IVS:        0.93 cm LVOT diam:     2.40 cm LV SV:         56 LV SV Index:   31 LVOT Area:     4.52 cm  LV Volumes (MOD) LV vol d, MOD A2C: 100.0 ml LV vol d, MOD A4C: 104.0 ml LV vol s, MOD A2C: 51.2 ml LV vol s, MOD A4C: 53.3 ml LV SV MOD A2C:     48.8 ml LV SV MOD A4C:     104.0 ml LV SV MOD BP:      53.7 ml RIGHT VENTRICLE            IVC RV S prime:     8.70 cm/s  IVC diam: 3.10 cm LEFT ATRIUM              Index        RIGHT ATRIUM           Index LA Vol (A2C):   245.0 ml 137.32 ml/m RA Area:     18.80 cm LA Vol (A4C):   206.0 ml 115.46 ml/m RA Volume:   49.50 ml  27.74 ml/m LA Biplane Vol: 224.0 ml 125.55 ml/m  AORTIC VALVE AV Area (Vmax):    1.27 cm AV Area (Vmean):   1.14 cm AV Area (VTI):     1.32 cm AV Vmax:           213.00 cm/s AV Vmean:          163.000 cm/s AV VTI:            0.423 m AV Peak Grad:      18.1 mmHg AV Mean Grad:      12.0 mmHg LVOT Vmax:  59.60 cm/s LVOT Vmean:        41.100 cm/s LVOT VTI:          0.123 m LVOT/AV VTI ratio: 0.29  AORTA Ao Asc diam: 3.30 cm MITRAL VALVE                TRICUSPID VALVE MV Area (PHT): 3.99 cm     TR Peak grad:   42.2 mmHg MV Area VTI:   1.61 cm     TR Vmax:        325.00 cm/s MV Peak grad:  19.5 mmHg MV Mean grad:  7.0 mmHg     SHUNTS MV Vmax:       2.21 m/s     Systemic VTI:  0.12 m MV Vmean:      117.0 cm/s   Systemic Diam: 2.40 cm MV Decel Time: 190 msec MV E velocity: 192.00 cm/s Eleonore Chiquito MD Electronically signed by Eleonore Chiquito MD Signature Date/Time: 03/05/2021/3:00:03 PM    Final (Updated)     PHYSICAL EXAM Frail middle-age Caucasian male not in distress.  He has dialysis AV fistula in the left forearm he has midline sternal scar from previous surgery for heart transplant. . Afebrile. Head is nontraumatic. Neck is supple without bruit.    Cardiac exam no murmur or gallop. Lungs are clear to auscultation. Distal pulses are well felt. Neurological Exam ;  Awake  Alert oriented x 3. Normal  speech and language.eye movements full without nystagmus.fundi were not visualized. Vision acuity is significantly diminished in the left eye with patient only being able to see in the small tunnel area at the center of the left eye.  Normal vision in the right eye.  Left pupil is sluggishly reactive and right is reactive.Mar[place] Kitchen Hearing is normal. Palatal movements are normal. Face symmetric. Tongue midline. Normal strength, tone, reflexes and coordination. Normal sensation. Gait deferred.  ASSESSMENT/PLAN Antonio Powell is a 45 y.o. male male with PMH significant for  has a past medical history of Heart transplant recipient [facility]) and Hypertension, ESRD on HD ( T/TH/S) presented with c/o vision loss in left eye.   Probable Stroke  embolic secondary to small vessel disease source  CT head No acute abnormality.   CTA head & neck no LVO  MRI  Can not have d/t pacer wires  2D EchoLeft Ventricle: Left ventricular ejection fraction, by estimation, is 50  to 55%. The left ventricle has low normal function. The left ventricle has  no regional wall motion abnormalities. The left ventricular internal  cavity size was normal in size.  There is moderate concentric left ventricular hypertrophy. Abnormal  (paradoxical) septal motion consistent with post-operative status. Left  ventricular diastolic function could not be evaluated due to abnormal  septal motion. Left ventricular diastolic  function could not be evaluated.   Right Ventricle: The right ventricular size is normal. No increase in  right ventricular wall thickness. Right ventricular systolic function is  mildly reduced. There is moderately elevated pulmonary artery systolic  pressure. The tricuspid regurgitant  velocity is 3.25 m/s, and with an assumed right atrial pressure of 8 mmHg,  the estimated right ventricular systolic pressure is 46.9 mmHg.   Left Atrium: Biatrial enlargement is present likely due to prior heart  transplant in  2001 that was likely performed usring a biatrial technique.  Left atrial size was severely dilated.   Right Atrium: Right atrial size was severely dilated.   Pericardium: Trivial pericardial effusion is present.  Mitral Valve: The mitral valve is grossly normal. There is mild thickening  of the mitral valve leaflet(s). Moderate mitral valve regurgitation. MV  peak gradient, 19.5 mmHg. The mean mitral valve gradient is 7.0 mmHg.   Tricuspid Valve: The tricuspid valve is grossly normal. Tricuspid valve  regurgitation is mild . No evidence of tricuspid stenosis.   Aortic Valve: 27 mm Freestyle AoV/Root replacement 01/2019. The aortic root  is thickened and there is hypermobility of the aortic-mitral continuity  concerning for possible aortic root abscess. There is a also a small  mobile mass under the NCC/LCC best  seen in the PLAX that measures 0.7 cm x 0.7 cm. These findings are highly  concerning for prosthetic valve endocarditis with involvement of the  prosthetic aortic root. The echo report from [facility] was reviewed via  [place] (12/08/2020), where  they metion possible prosthetic valve dehisensce, but this is not a  stented valve. Suspect, this is related to root abscess based on  appearance. TEE was not performed at [facility]. Would recommend TEE or  gated cardiac CT for further clarification.  Would also recommend blood cultures. The aortic valve has been  repaired/replaced. Aortic valve regurgitation is not visualized. Aortic  valve mean gradient measures 12.0 mmHg. Aortic valve peak gradient  measures 18.1 mmHg. Aortic valve area, by VTI  measures 1.32 cm. There is a 27 mm Medtronic homograft valve present in  the aortic position. Procedure Date: 01/2019.   Pulmonic Valve: The pulmonic valve was grossly normal. Pulmonic valve  regurgitation is mild. No evidence of pulmonic stenosis.   Aorta: Prior aortic root/AoV replacement 27 mm fresstyle. Concerns for   aortic root abscess detailed in the report. The aortic root/ascending  aorta has been repaired/replaced.   Venous: The inferior vena cava is dilated in size with greater than 50%  respiratory variability, suggesting right atrial pressure of 8 mmHg.   IAS/Shunts: The atrial septum is grossly normal.   LDL 54  HgbA1c 5.3  VTE prophylaxis - SCD's    Diet   Diet Heart Room service appropriate? Yes; Fluid consistency: Thin     aspirin 81 mg daily prior to admission, now on clopidogrel 75 mg daily.   Therapy recommendations:  No needs  Disposition:  home  Hypertension  Home meds:  hydralazine  Stable . Permissive hypertension (OK if < 220/120) but gradually normalize in 5-7 days . Long-term BP goal normotensive  Hyperlipidemia  Home meds:  pravastatin 40, resumed in hospital  LDL 54, goal < 70  Continue statin at discharge  Diabetes type II Controlled  Home meds:  none  HgbA1c 5.3, goal < 7.0  Other Stroke Risk Factors  Cigarette smoker advised to stop smoking  ETOH use, alcohol level <10, advised to drink no more than 2 drinks a day  Substance abuse - UDS:  THC No results found for requested labs within last 26280 hours., Cocaine No results found for requested labs within last 26280 hours.. Patient advised to stop using due to stroke risk.    Other Active Problems  Endocarditis (?)- per primary team and cardiology  Consult opthalmology for vision loss  Hospital day # 0   Laurey Morale, MSN, NP-C [facility] [phone] I have personally obtained history,examined this patient, reviewed notes, independently viewed imaging studies, participated in medical decision making and plan of care.ROS completed by me personally and pertinent positives fully documented  I have made any additions or clarifications directly to the above note. Agree  with note above.  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  Recommend cardiology consult and ID consult for management of endocarditis.  Agree with changing aspirin to Plavix for stroke prevention and aggressive risk factor modification.  Patient apparently is going be transferred to [facility] since he had his cardiac transplant and cardiology care for the management of endocarditis.  Discussed with Dr. Doristine Bosworth.  Greater than 50% time during this 35-minute visit was spent on counseling and coordination of care about his vision loss and discussion about endocarditis and answered questions.  Antony Contras, MD Medical Director [facility] Pager: [phone] 03/06/2021 5:29 PM   To contact Stroke Continuity provider, please refer to [email or web address] After hours, contact General Neurology

## 2021-03-06 NOTE — Progress Notes (Signed)
PROGRESS NOTE    Antonio Powell  VQQ:[id number] DOB: 09-18-1976 DOA: 03/04/2021 PCP: Medicine, [facility]   Brief Narrative:  Antonio Powell  is a 45 y.o. male, with history of HTN, HLD, Heart transplant at age 4, ESRD with HD on T TH S, and more presents to the ED with a chief complaint of loss of vision in the left eye.  Patient reports that on 23 April he had sudden onset of bright light in his left eye followed by complete darkness.  This was painless.   No other associated complaint.   The vision never came back. He had to wait till he can get a ride so on 03/04/2021, he was able to go see a provider.  He reports he saw his PCP who sent him to an ophthalmologist.  The ophthalmologist gave him eyedrops and sent him to the ER.  No previous history of such. He was admitted for colitis about a week ago, but reports that his nausea vomiting has cleared up since then.    Patient has a history of heart transplant approximately 20 years ago, and heart valve replacements since then.  He reports compliance with his medication.  Patient does smoke black and milds, does not drink any alcohol, and uses marijuana 3-4 times per week.  Patient is vaccinated for COVID.   In the ED, he was hemodynamically stable. CT head shows no acute intracranial abnormality.  Small chronic infarct right cerebellar hemisphere. CTA head and neck shows no large vessel occlusion, significant stenosis or aneurysm in the head or neck. Dr. Leonel Ramsay was consulted and advised admission for stroke work-up including A1c, lipids, echo, CTA head and neck (done above), and MRI brain. Patient does have a pacemaker and was transferred to [facility] as there was no MRI at [facility].    Assessment & Plan:   Principal Problem:   Retinal artery occlusion Active Problems:   Heart transplant recipient [facility])   ESRD (end stage renal disease) ([place])   Gastroesophageal reflux disease with esophagitis without hemorrhage    Protein-calorie malnutrition, moderate (HCC)   Tobacco use disorder  Sudden painless vision loss on the left eye: No other focal deficit. CTA head and neck negative for any occlusion. Unfortunately,MRI cannot be done due to him having abandoned epicardial wires.  Seen by neurology.  They have nothing to offer.  Patient's vision remains impaired on the left side.  Transthoracic echo yesterday indicates possibility of aortic root abscess and possible vegetation/endocarditis.  This could very well explain the [place] of him having retinal artery thromboembolism causing sudden painless vision loss.  No other deficit, doubt any stroke.  Possible aortic root abscess/endocarditis: Transthoracic echo shows aortic root abscess and possible endocarditis.  Blood culture drawn yesterday remain negative.  Consulted cardiology yesterday.  Received a call from [place] of cardiology that Dr. Haroldine Laws has reviewed the case and has recommended to transfer the patient to [facility] since he had transplant there and he has cardiologist there.  Discussed with Dr. Davina Poke at [facility] and then discussed with Dr. Alric Ran cardiologist at [facility] who has accepted the patient in transfer.  Also called the transfer line.  No beds available.  Patient on the waiting list for transfer.  Primary nurse aware.  No other signs of infection.  Will hold off antibiotics until solid evidence of endocarditis.  ESRD: Gets dialysis on TTS.  Nephrology on board.  Protein calorie malnutrition: Albumin 2.8.  Encourage nutrition.  Anemia of chronic  disease: Hemoglobin is stable.  History of heart transplant: Continue antirejection medication as well as prednisone.  Hyperlipidemia: Continue statin and Colestid.  Essential hypertension: Blood pressure controlled.  Continue home medications which include Coreg, hydralazine and diltiazem.  GERD: Continue PPI and famotidine.  Tobacco dependence: Smoking cessation provided.  DVT prophylaxis:  SCD's Start: 03/04/21 2343, per patient he is allergic to heparin.   Code Status: Full Code  Family Communication: None present at bedside.  Plan of care discussed with patient in length and he verbalized understanding and agreed with it.  Status is: Observation  The patient will require care spanning > 2 midnights and should be moved to inpatient because: Ongoing diagnostic testing needed not appropriate for outpatient work up  Dispo: The patient is from: Home              Anticipated d/c is to: [facility]              Patient currently is not medically stable to d/c.   Difficult to place patient No        Estimated body mass index is 18.76 kg/m as calculated from the following:   Height as of this encounter: 5\' 11"  (1.803 m).   Weight as of this encounter: 61 kg.      Nutritional status:               Consultants:   Neurology and nephrology  Procedures:   None  Antimicrobials:  Anti-infectives (From admission, onward)   None         Subjective: Patient seen and examined.  No new complaint.  Vision on the left eye remains impaired.  Objective: Vitals:   03/05/21 1834 03/05/21 2126 03/06/21 0619 03/06/21 1134  BP: 103/78 99/69 98/78  98/72  Pulse: 94 89 86 85  Resp: 18 16 14 16   Temp: 98.3 F (36.8 C) 98.2 F (36.8 C) 98.2 F (36.8 C) 98.3 F (36.8 C)  TempSrc: Oral Oral Oral   SpO2: 98% 98% 98% 95%  Weight:      Height:        Intake/Output Summary (Last 24 hours) at 03/06/2021 1328 Last data filed at 03/05/2021 1700 Gross per 24 hour  Intake --  Output 1000 ml  Net -1000 ml   Filed Weights   03/04/21 1412 03/05/21 1325 03/05/21 1700  Weight: 61.2 kg 62 kg 61 kg    Examination:  General exam: Appears calm and comfortable  Respiratory system: Clear to auscultation. Respiratory effort normal. Cardiovascular system: S1 & S2 heard, RRR. No JVD, murmurs, rubs, gallops or clicks. No pedal edema. Gastrointestinal system:  Abdomen is nondistended, soft and nontender. No organomegaly or masses felt. Normal bowel sounds heard. Central nervous system: Alert and oriented. No focal neurological deficits.  Loss of vision in the left eye. Extremities: Symmetric 5 x 5 power. Skin: No rashes, lesions or ulcers.  Psychiatry: Judgement and insight appear normal. Mood & affect appropriate.    Data Reviewed: I have personally reviewed following labs and imaging studies  CBC: Recent Labs  Lab 03/04/21 1623 03/05/21 0314  WBC 9.3 8.7  NEUTROABS 6.3  --   HGB 8.8* 8.3*  HCT 25.5* 23.2*  MCV 87.0 83.2  PLT 156 244*   Basic Metabolic Panel: Recent Labs  Lab 03/04/21 1623 03/05/21 0314  NA 134* 133*  K 4.3 4.6  CL 95* 93*  CO2 25 25  GLUCOSE 97 96  BUN 35* 37*  CREATININE 9.59* 10.69*  CALCIUM  9.6 9.2   GFR: Estimated Creatinine Clearance: 7.8 mL/min (A) (by C-G formula based on SCr of 10.69 mg/dL (H)). Liver Function Tests: Recent Labs  Lab 03/04/21 1623 03/05/21 0314  AST 13* 11*  ALT 17 14  ALKPHOS 59 49  BILITOT 0.4 0.4  PROT 7.3 5.9*  ALBUMIN 2.8* 2.3*   No results for input(s): LIPASE, AMYLASE in the last 168 hours. No results for input(s): AMMONIA in the last 168 hours. Coagulation Profile: Recent Labs  Lab 03/04/21 1623  INR 1.1   Cardiac Enzymes: No results for input(s): CKTOTAL, CKMB, CKMBINDEX, TROPONINI in the last 168 hours. BNP (last 3 results) No results for input(s): PROBNP in the last 8760 hours. HbA1C: Recent Labs    03/05/21 0314  HGBA1C 5.3   CBG: No results for input(s): GLUCAP in the last 168 hours. Lipid Profile: Recent Labs    03/05/21 0314  CHOL 138  HDL 71  LDLCALC 54  TRIG 64  CHOLHDL 1.9   Thyroid Function Tests: No results for input(s): TSH, T4TOTAL, FREET4, T3FREE, THYROIDAB in the last 72 hours. Anemia Panel: No results for input(s): VITAMINB12, FOLATE, FERRITIN, TIBC, IRON, RETICCTPCT in the last 72 hours. Sepsis Labs: No results for  input(s): PROCALCITON, LATICACIDVEN in the last 168 hours.  Recent Results (from the past 240 hour(s))  Resp Panel by RT-PCR (Flu A&B, Covid) Nasopharyngeal Swab     Status: None   Collection Time: 03/04/21  4:02 PM   Specimen: Nasopharyngeal Swab; Nasopharyngeal(NP) swabs in vial transport medium  Result Value Ref Range Status   SARS Coronavirus 2 by RT PCR NEGATIVE NEGATIVE Final    Comment: (NOTE) SARS-CoV-2 target nucleic acids are NOT DETECTED.  The SARS-CoV-2 RNA is generally detectable in upper respiratory specimens during the acute phase of infection. The lowest concentration of SARS-CoV-2 viral copies this assay can detect is 138 copies/mL. A negative result does not preclude SARS-Cov-2 infection and should not be used as the sole basis for treatment or other patient management decisions. A negative result may occur with  improper specimen collection/handling, submission of specimen other than nasopharyngeal swab, presence of viral mutation(s) within the areas targeted by this assay, and inadequate number of viral copies(<138 copies/mL). A negative result must be combined with clinical observations, patient history, and epidemiological information. The expected result is Negative.  Fact Sheet for Patients:  EntrepreneurPulse.com.au  Fact Sheet for Healthcare Providers:  IncredibleEmployment.be  This test is no t yet approved or cleared by the [place] FDA and  has been authorized for detection and/or diagnosis of SARS-CoV-2 by FDA under an Emergency Use Authorization (EUA). This EUA will remain  in effect (meaning this test can be used) for the duration of the COVID-19 declaration under Section 564(b)(1) of the Act, 21 U.S.C.section 360bbb-3(b)(1), unless the authorization is terminated  or revoked sooner.       Influenza A by PCR NEGATIVE NEGATIVE Final   Influenza B by PCR NEGATIVE NEGATIVE Final    Comment: (NOTE) The  Xpert Xpress SARS-CoV-2/FLU/RSV plus assay is intended as an aid in the diagnosis of influenza from Nasopharyngeal swab specimens and should not be used as a sole basis for treatment. Nasal washings and aspirates are unacceptable for Xpert Xpress SARS-CoV-2/FLU/RSV testing.  Fact Sheet for Patients: EntrepreneurPulse.com.au  Fact Sheet for Healthcare Providers: IncredibleEmployment.be  This test is not yet approved or cleared by the [place] FDA and has been authorized for detection and/or diagnosis of SARS-CoV-2 by FDA under an Emergency Use  Authorization (EUA). This EUA will remain in effect (meaning this test can be used) for the duration of the COVID-19 declaration under Section 564(b)(1) of the Act, 21 U.S.C. section 360bbb-3(b)(1), unless the authorization is terminated or revoked.  Performed at [facility], [address], [place], [place] [address]   Culture, blood (routine x 2)     Status: None (Preliminary result)   Collection Time: 03/05/21  5:36 PM   Specimen: BLOOD LEFT HAND  Result Value Ref Range Status   Specimen Description BLOOD LEFT HAND  Final   Special Requests   Final    BOTTLES DRAWN AEROBIC ONLY Blood Culture results may not be optimal due to an inadequate volume of blood received in culture bottles   Culture   Final    NO GROWTH < 12 HOURS Performed at [facility] [address] [place], [place] [address]    Report Status PENDING  Incomplete  Culture, blood (routine x 2)     Status: None (Preliminary result)   Collection Time: 03/05/21  5:45 PM   Specimen: BLOOD LEFT HAND  Result Value Ref Range Status   Specimen Description BLOOD LEFT HAND  Final   Special Requests   Final    BOTTLES DRAWN AEROBIC ONLY Blood Culture results may not be optimal due to an inadequate volume of blood received in culture bottles   Culture   Final    NO GROWTH < 12 HOURS Performed at [facility], [place] [address] [place], [place] [address]    Report Status PENDING  Incomplete      Radiology Studies: CT Angio Head W or Wo Contrast  Result Date: 03/04/2021 CLINICAL DATA:  Left eye vision loss for 5 days. EXAM: CT ANGIOGRAPHY HEAD AND NECK TECHNIQUE: Multidetector CT imaging of the head and neck was performed using the standard protocol during bolus administration of intravenous contrast. Multiplanar CT image reconstructions and MIPs were obtained to evaluate the vascular anatomy. Carotid stenosis measurements (when applicable) are obtained utilizing NASCET criteria, using the distal internal carotid diameter as the denominator. CONTRAST:  22mL OMNIPAQUE IOHEXOL 350 MG/ML SOLN COMPARISON:  None. FINDINGS: CTA NECK FINDINGS Aortic arch: Normal variant aortic arch branching pattern with common origin of the brachiocephalic and left common carotid arteries. Right carotid system: Patent without evidence of stenosis, dissection, or significant atherosclerosis. Left carotid system: Patent without evidence of stenosis, dissection, or significant atherosclerosis. Vertebral arteries: Patent without evidence of stenosis, dissection, or significant atherosclerosis. Skeleton: Edentulous. Moderate multilevel disc space narrowing in the cervical spine. Other neck: No evidence of cervical lymphadenopathy or mass. Upper chest: Mild centrilobular emphysema in the lung apices with incompletely imaged asymmetric subpleural reticulation in the right upper lobe which may reflect scarring/mild fibrosis. Review of the MIP images confirms the above findings CTA HEAD FINDINGS Anterior circulation: The internal carotid arteries are widely patent from skull base to carotid termini. ACAs and MCAs are patent without evidence of a proximal branch occlusion or significant proximal stenosis. No aneurysm is identified. Posterior circulation: The intracranial vertebral arteries are widely patent to the basilar. Patent PICA and SCA origins are seen  bilaterally. The basilar artery is widely patent. Posterior communicating arteries are diminutive or absent. Both PCAs are patent without evidence of a significant proximal stenosis. No aneurysm is identified. Venous sinuses: Patent. Anatomic variants: None. Review of the MIP images confirms the above findings IMPRESSION: 1. No large vessel occlusion, significant stenosis, or aneurysm in the head and neck. 2.  Emphysema (ICD10-J43.9). Electronically Signed   By: Logan Bores M.D.   On: 03/04/2021 17:39   CT Head Wo Contrast  Result Date: 03/04/2021 CLINICAL DATA:  Neuro deficit, acute, stroke suspected. Additional history provided: Vision loss in left eye 5 days. EXAM: CT HEAD WITHOUT CONTRAST TECHNIQUE: Contiguous axial images were obtained from the base of the skull through the vertex without intravenous contrast. COMPARISON:  Report from head CT 08/06/2007 (images currently unavailable). FINDINGS: Brain: Cerebral volume is normal for age. A small chronic infarct is questioned within the right cerebellar hemisphere (for instance as seen on series 4, image 52). There is no acute intracranial hemorrhage. No demarcated cortical infarct. No extra-axial fluid collection. No evidence of intracranial mass. No midline shift. Partially empty sella turcica. Vascular: No hyperdense vessel. Skull: Normal. Negative for fracture or focal lesion. Sinuses/Orbits:a Visualized orbits show no acute finding. No significant paranasal sinus disease at the imaged levels. IMPRESSION: No evidence of acute intracranial abnormality. A small chronic infarct is questioned within the right cerebellar hemisphere. Electronically Signed   By: Kellie Simmering DO   On: 03/04/2021 15:42   CT Angio Neck W and/or Wo Contrast  Result Date: 03/04/2021 CLINICAL DATA:  Left eye vision loss for 5 days. EXAM: CT ANGIOGRAPHY HEAD AND NECK TECHNIQUE: Multidetector CT imaging of the head and neck was performed using the standard protocol during bolus  administration of intravenous contrast. Multiplanar CT image reconstructions and MIPs were obtained to evaluate the vascular anatomy. Carotid stenosis measurements (when applicable) are obtained utilizing NASCET criteria, using the distal internal carotid diameter as the denominator. CONTRAST:  24mL OMNIPAQUE IOHEXOL 350 MG/ML SOLN COMPARISON:  None. FINDINGS: CTA NECK FINDINGS Aortic arch: Normal variant aortic arch branching pattern with common origin of the brachiocephalic and left common carotid arteries. Right carotid system: Patent without evidence of stenosis, dissection, or significant atherosclerosis. Left carotid system: Patent without evidence of stenosis, dissection, or significant atherosclerosis. Vertebral arteries: Patent without evidence of stenosis, dissection, or significant atherosclerosis. Skeleton: Edentulous. Moderate multilevel disc space narrowing in the cervical spine. Other neck: No evidence of cervical lymphadenopathy or mass. Upper chest: Mild centrilobular emphysema in the lung apices with incompletely imaged asymmetric subpleural reticulation in the right upper lobe which may reflect scarring/mild fibrosis. Review of the MIP images confirms the above findings CTA HEAD FINDINGS Anterior circulation: The internal carotid arteries are widely patent from skull base to carotid termini. ACAs and MCAs are patent without evidence of a proximal branch occlusion or significant proximal stenosis. No aneurysm is identified. Posterior circulation: The intracranial vertebral arteries are widely patent to the basilar. Patent PICA and SCA origins are seen bilaterally. The basilar artery is widely patent. Posterior communicating arteries are diminutive or absent. Both PCAs are patent without evidence of a significant proximal stenosis. No aneurysm is identified. Venous sinuses: Patent. Anatomic variants: None. Review of the MIP images confirms the above findings IMPRESSION: 1. No large vessel occlusion,  significant stenosis, or aneurysm in the head and neck. 2. Emphysema (ICD10-J43.9). Electronically Signed   By: Logan Bores M.D.   On: 03/04/2021 17:39   ECHOCARDIOGRAM COMPLETE  Result Date: 03/05/2021    ECHOCARDIOGRAM REPORT   Patient Name:   Samson Frederic Date of Exam: 03/05/2021 Medical Rec #:  [id number]          Height:       71.0 in Accession #:    [id number]         Weight:  135.0 lb Date of Birth:  10/18/1976           BSA:          1.784 m Patient Age:    44 years           BP:           117/94 mmHg Patient Gender: M                  HR:           105 bpm. Exam Location:  Inpatient Procedure: 2D Echo, Cardiac Doppler and Color Doppler Indications:     Stroke  History:         Patient has no prior history of Echocardiogram examinations.                  Heart transplant, Pacemaker and Prior Cardiac Surgery; Risk                  Factors:Hypertension and Current Smoker. Heart Transplant                  12/25/1999                  Freestyle Aortic Valve/Root 27 mm 01/2019.                  Aortic Valve: 27 mm Medtronic homograft valve is present in the                  aortic position. Procedure Date: 01/2019.  Sonographer:     Cammy Brochure Referring Phys:  [id number] [place] Diagnosing Phys: Eleonore Chiquito MD IMPRESSIONS  1. 27 mm Freestyle AoV/Root replacement 01/2019. 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 There is a also a small mobile mass under the NCC/LCC best seen in the PLAX that measures 0.7 cm x 0.7 cm. 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 The echo report from [facility] was reviewed via [place] (12/08/2020), where they metion possible prosthetic valve dehisensce, but this is not a stented valve. Suspect, this is related to root abscess based on appearance. TEE was not performed at [facility].  Would recommend TEE or gated cardiac CT for further clarification. Would also recommend blood cultures. The aortic valve has been repaired/replaced. Aortic valve regurgitation is not visualized. There is a 27 mm Medtronic homograft valve present in the aortic position. Procedure Date: 01/2019.  2. Left ventricular ejection fraction, by estimation, is 50 to 55%. The left ventricle has low normal function. The left ventricle has no regional wall motion abnormalities. There is moderate concentric left ventricular hypertrophy. Left ventricular diastolic function could not be evaluated.  3. Right ventricular systolic function is mildly reduced. The right ventricular size is normal. There is moderately elevated pulmonary artery systolic pressure. The estimated right ventricular systolic pressure is 19.5 mmHg.  4. Biatrial enlargement is present likely due to prior heart transplant in 2001 that was likely performed usring a biatrial technique. Left atrial size was severely dilated.  5. Right atrial size was severely dilated.  6. The mitral valve is grossly normal. Moderate mitral valve regurgitation.  7. The inferior vena cava is dilated in size with >50% respiratory variability, suggesting right atrial pressure of 8 mmHg.  8. Prior aortic root/AoV replacement 27 mm fresstyle. Concerns for aortic root abscess  detailed in the report. Aortic root/ascending aorta has been repaired/replaced. FINDINGS  Left Ventricle: Left ventricular ejection fraction, by estimation, is 50 to 55%. The left ventricle has low normal function. The left ventricle has no regional wall motion abnormalities. The left ventricular internal cavity size was normal in size. There is moderate concentric left ventricular hypertrophy. Abnormal (paradoxical) septal motion consistent with post-operative status. Left ventricular diastolic function could not be evaluated due to abnormal septal motion. Left ventricular diastolic function could not be evaluated.  Right Ventricle: The right ventricular size is normal. No increase in right ventricular wall thickness. Right ventricular systolic function is mildly reduced. There is moderately elevated pulmonary artery systolic pressure. The tricuspid regurgitant velocity is 3.25 m/s, and with an assumed right atrial pressure of 8 mmHg, the estimated right ventricular systolic pressure is 01.0 mmHg. Left Atrium: Biatrial enlargement is present likely due to prior heart transplant in 2001 that was likely performed usring a biatrial technique. Left atrial size was severely dilated. Right Atrium: Right atrial size was severely dilated. Pericardium: Trivial pericardial effusion is present. Mitral Valve: The mitral valve is grossly normal. There is mild thickening of the mitral valve leaflet(s). Moderate mitral valve regurgitation. MV peak gradient, 19.5 mmHg. The mean mitral valve gradient is 7.0 mmHg. Tricuspid Valve: The tricuspid valve is grossly normal. Tricuspid valve regurgitation is mild . No evidence of tricuspid stenosis. Aortic Valve: 27 mm Freestyle AoV/Root replacement 01/2019. 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 There is a also a small mobile mass under the NCC/LCC best seen in the PLAX that measures 0.7 cm x 0.7 cm. 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 The echo report from [facility] was reviewed via [place] (12/08/2020), where they metion possible prosthetic valve dehisensce, but this is not a stented valve. Suspect, this is related to root abscess based on appearance. TEE was not performed at [facility]. Would recommend TEE or gated cardiac CT for further clarification. Would also recommend blood cultures. The aortic valve has been repaired/replaced. Aortic valve regurgitation is not visualized. Aortic valve mean gradient measures  12.0 mmHg. Aortic valve peak gradient measures 18.1 mmHg. Aortic valve area, by VTI measures 1.32 cm. There is a 27 mm Medtronic homograft valve present in the aortic position. Procedure Date: 01/2019. Pulmonic Valve: The pulmonic valve was grossly normal. Pulmonic valve regurgitation is mild. No evidence of pulmonic stenosis. Aorta: Prior aortic root/AoV replacement 27 mm fresstyle. Concerns for aortic root abscess detailed in the report. The aortic root/ascending aorta has been repaired/replaced. Venous: The inferior vena cava is dilated in size with greater than 50% respiratory variability, suggesting right atrial pressure of 8 mmHg. IAS/Shunts: The atrial septum is grossly normal.  LEFT VENTRICLE PLAX 2D LVIDd:         4.20 cm LVIDs:         3.00 cm LV PW:         1.20 cm LV IVS:        0.93 cm LVOT diam:     2.40 cm LV SV:         56 LV SV Index:   31 LVOT Area:     4.52 cm  LV Volumes (MOD) LV vol d, MOD A2C: 100.0 ml LV vol d, MOD A4C: 104.0 ml LV vol s, MOD A2C: 51.2 ml LV vol s, MOD A4C: 53.3 ml LV  SV MOD A2C:     48.8 ml LV SV MOD A4C:     104.0 ml LV SV MOD BP:      53.7 ml RIGHT VENTRICLE            IVC RV S prime:     8.70 cm/s  IVC diam: 3.10 cm LEFT ATRIUM              Index        RIGHT ATRIUM           Index LA Vol (A2C):   245.0 ml 137.32 ml/m RA Area:     18.80 cm LA Vol (A4C):   206.0 ml 115.46 ml/m RA Volume:   49.50 ml  27.74 ml/m LA Biplane Vol: 224.0 ml 125.55 ml/m  AORTIC VALVE AV Area (Vmax):    1.27 cm AV Area (Vmean):   1.14 cm AV Area (VTI):     1.32 cm AV Vmax:           213.00 cm/s AV Vmean:          163.000 cm/s AV VTI:            0.423 m AV Peak Grad:      18.1 mmHg AV Mean Grad:      12.0 mmHg LVOT Vmax:         59.60 cm/s LVOT Vmean:        41.100 cm/s LVOT VTI:          0.123 m LVOT/AV VTI ratio: 0.29  AORTA Ao Asc diam: 3.30 cm MITRAL VALVE                TRICUSPID VALVE MV Area (PHT): 3.99 cm     TR Peak grad:   42.2 mmHg MV Area VTI:   1.61 cm     TR Vmax:         325.00 cm/s MV Peak grad:  19.5 mmHg MV Mean grad:  7.0 mmHg     SHUNTS MV Vmax:       2.21 m/s     Systemic VTI:  0.12 m MV Vmean:      117.0 cm/s   Systemic Diam: 2.40 cm MV Decel Time: 190 msec MV E velocity: 192.00 cm/s Eleonore Chiquito MD Electronically signed by Eleonore Chiquito MD Signature Date/Time: 03/05/2021/3:00:03 PM    Final (Updated)     Scheduled Meds: . amitriptyline  10 mg Oral QHS  . calcium acetate  2,001 mg Oral TID WC  . carvedilol  6.25 mg Oral BID WC  . Chlorhexidine Gluconate Cloth  6 each Topical Q0600  . Chlorhexidine Gluconate Cloth  6 each Topical Q0600  . clopidogrel  75 mg Oral Daily  . colestipol  1 g Oral Daily  . diltiazem  360 mg Oral Daily  . famotidine  10 mg Oral QHS  . gabapentin  100 mg Oral TID  . melatonin  4.5 mg Oral QHS  . multivitamin  1 tablet Oral Daily  . mycophenolate  500 mg Oral BID  . nicotine  14 mg Transdermal Daily  . pantoprazole  40 mg Oral BID  . pravastatin  40 mg Oral q1800  . predniSONE  5 mg Oral Q breakfast  . sirolimus  1 mg Oral Daily  . tacrolimus  5 mg Oral Daily  . tacrolimus  6 mg Oral QHS   Continuous Infusions:   LOS: 0 days   Time spent: 35 minutes   Einar Grad  Jayln Branscom, MD [facility]  03/06/2021, 1:28 PM   To contact the attending provider between 7A-7P or the covering provider during after hours 7P-7A, please log into the web site [email or web address].

## 2021-03-06 NOTE — Consult Note (Signed)
NEUROLOGY CONSULTATION NOTE   Date of service: March 06, 2021 Patient Name: Antonio Powell MRN:  [id number] DOB:  January 14, 1976 Reason for consult: "CRAO/Stroke Workup" _ _ _   _ __   _ __ _ _  __ __   _ __   __ _  History of Present Illness  Antonio Powell is a 45 y.o. male with PMH significant for  has a past medical history of Heart transplant recipient [facility]) and Hypertension. who presents with to the ED with a chief complaint of loss of vision in the left eye. Patient reports that on 23 April he had sudden onset of bright light in his left eye and in the left eye going completely dark. He had no pain to his eye or headache at that time. He had no associated dysphagia, change in speech, asymmetric weakness or numbness, or change in coordination. Patient does walk on his own at home and has had no difficulty with ambulation. He reports that the site never came back in his left eye.  He had to wait till he can get a ride so today was the first day he was able to go see a provider. He reports he saw his PCP who sent him to an ophthalmologist.  The ophthalmologist sent him to the ER. Patient reports is never had anything like this before. He does report that he has had full body aches starting today. This is happened before and it was secondary to an ear infection. He has taken Tylenol and muscle relaxers without relief. He denies any fevers. He reports any position changes make this pain worse. Rest makes it better. He request pain medication for this set of pains. He is vaccinated for COVID, has not been exposed to anybody with COVID, has not had any infectious symptoms. He was admitted for colitis about a week ago, but reports that his nausea vomiting has cleared up since then. He does continue to have soft stools, but reports that that has been going on for 2 years.  Patient has a history of heart transplant approximately 20 years ago, and heart valve replacements since then. He reports compliance  with his medication. Patient is an ESRD patient -Tuesday Thursday Saturday schedule with left AV fistula. Last dialysis was April 26, due for next dialysis April 28.  Dr. Joylene Grapes aware of nephro consult. Patient does smoke black and milds, does not drink any alcohol, and uses marijuana 3-4 times per week. Patient is vaccinated for COVID. Patient is full code.    He reports that he was at a friends house on Saturday 4/23, in the evening when he was leaving he suddenly had a flash of bright light in his Left eye. After the flash his eye went dark leaving only a small spot of tunnel vision which he could still see, though it was very fuzzy. He states he was hopeful that it would get better on its own so went to bed but when he woke up it was still present. He went to dialysis on Tues 4/26 and told the staff about his vision loss. They got an appointment for him to see his PCP on Wed. He saw his PCP who sent him to an Ophthalmologist that day who then sent him to the ED at [facility]. He was then transferred here for Stroke Work up. He reports that at the time he had no headache, no pain in his eye, and no other changes that he was aware of.  ROS   Constitutional Denies weight loss, fever and chills.   HEENT Denies changes in hearing.   Respiratory Denies SOB and cough.   CV Denies palpitations and CP   GI Denies abdominal pain, nausea, vomiting and diarrhea.   GU Denies dysuria and urinary frequency.   MSK Denies myalgia and reports some Right Hip pain   Skin Denies rash and pruritus.   Neurological Denies headache and syncope.   Psychiatric Denies recent changes in mood. Denies anxiety and depression.    Past History   Past Medical History:  Diagnosis Date  . Heart transplant recipient [facility])   . Hypertension    Past Surgical History:  Procedure Laterality Date  . APPENDECTOMY    . CARDIAC SURGERY    . CHOLECYSTECTOMY    . HERNIA REPAIR    . left ear     History reviewed. No  pertinent family history. Social History   Socioeconomic History  . Marital status: Single    Spouse name: Not on file  . Number of children: Not on file  . Years of education: Not on file  . Highest education level: Not on file  Occupational History  . Not on file  Tobacco Use  . Smoking status: Current Every Day Smoker    Packs/day: 0.50    Types: Cigarettes  . Smokeless tobacco: Never Used  . Tobacco comment: black and mild  Vaping Use  . Vaping Use: Never used  Substance and Sexual Activity  . Alcohol use: Yes    Comment: occ  . Drug use: Yes    Frequency: 2.0 times per week    Types: Marijuana  . Sexual activity: Not on file  Other Topics Concern  . Not on file  Social History Narrative  . Not on file   Social Determinants of Health   Financial Resource Strain: Not on file  Food Insecurity: Not on file  Transportation Needs: Not on file  Physical Activity: Not on file  Stress: Not on file  Social Connections: Not on file   Allergies  Allergen Reactions  . Nsaids     Cant take due to heart transplant  . Tolmetin Other (See Comments)    Cant take due to heart transplant  . Varenicline Other (See Comments)    Vivid dreams  . Phenergan [Promethazine Hcl] Anxiety    Medications   Medications Prior to Admission  Medication Sig Dispense Refill Last Dose  . acetaminophen (TYLENOL) 500 MG tablet Take 500 mg by mouth every 4 (four) hours as needed for moderate pain.   03/03/2021 at Unknown time  . allopurinol (ZYLOPRIM) 100 MG tablet Take 100 mg by mouth daily.   03/03/2021 at 0900  . amitriptyline (ELAVIL) 10 MG tablet Take 10 mg by mouth at bedtime.   03/03/2021 at Unknown time  . aspirin 81 MG chewable tablet Chew 81 mg by mouth daily.   03/03/2021 at Unknown time  . calcium acetate (PHOSLO) 667 MG capsule Take 2,001 mg by mouth 3 (three) times daily with meals. 2001 mg with meals and 1334 with snacks   03/03/2021 at Unknown time  . carvedilol (COREG) 6.25 MG  tablet Take 6.25 mg by mouth 2 (two) times daily.    03/03/2021 at 2100  . colestipol (COLESTID) 1 g tablet Take 1 tablet by mouth daily.   03/03/2021 at Unknown time  . cyclobenzaprine (FLEXERIL) 10 MG tablet Take 10 mg by mouth 2 (two) times daily.   03/03/2021 at Unknown time  .  diltiazem (CARDIZEM CD) 360 MG 24 hr capsule Take 360 mg by mouth in the morning and at bedtime.   03/03/2021 at Unknown time  . famotidine (PEPCID) 10 MG tablet Take 1 tablet (10 mg total) by mouth at bedtime. 30 tablet 2 03/03/2021 at Unknown time  . folic acid (FOLVITE) 1 MG tablet Take 1 mg by mouth daily.   03/03/2021 at Unknown time  . gabapentin (NEURONTIN) 100 MG capsule Take 100 mg by mouth 3 (three) times daily.   03/03/2021 at Unknown time  . hydrALAZINE (APRESOLINE) 50 MG tablet Take 50 mg by mouth in the morning and at bedtime.   03/03/2021 at Unknown time  . hyoscyamine (ANASPAZ) 0.125 MG TBDP disintergrating tablet Place 0.125 mg under the tongue every 6 (six) hours as needed for cramping.   Past Week at Unknown time  . imiquimod (ALDARA) 5 % cream Apply 1 application topically 3 (three) times a week.   03/03/2021 at Unknown time  . linaclotide (LINZESS) 145 MCG CAPS capsule Take 1 capsule (145 mcg total) by mouth daily before breakfast. Hold for diarrhea     . loperamide (IMODIUM) 2 MG capsule Take 2-4 mg by mouth every 6 (six) hours as needed.     . magnesium oxide (MAG-OX) 400 MG tablet Take 400 mg by mouth daily.   03/03/2021 at Unknown time  . melatonin 3 MG TABS tablet Take 1.5 tablets (4.5 mg total) by mouth at bedtime.  0 03/03/2021 at Unknown time  . mycophenolate (CELLCEPT) 250 MG capsule Take 500 mg by mouth 2 (two) times daily.   03/03/2021 at Unknown time  . ondansetron (ZOFRAN) 4 MG tablet Take 4 mg by mouth every 8 (eight) hours as needed for nausea or vomiting.   03/03/2021 at Unknown time  . ondansetron (ZOFRAN-ODT) 4 MG disintegrating tablet Take 4 mg by mouth every 8 (eight) hours as needed.     .  pantoprazole (PROTONIX) 40 MG tablet Take 1 tablet (40 mg total) by mouth 2 (two) times daily. 60 tablet 2 03/03/2021 at Unknown time  . pravastatin (PRAVACHOL) 40 MG tablet Take 40 mg by mouth daily.   03/03/2021 at Unknown time  . predniSONE (DELTASONE) 5 MG tablet Take 5 mg by mouth daily with breakfast.   03/03/2021 at Unknown time  . sirolimus (RAPAMUNE) 1 MG tablet Take 1 mg by mouth daily.   03/03/2021 at Unknown time  . sucralfate (CARAFATE) 1 GM/10ML suspension Take 10 mLs (1 g total) by mouth 4 (four) times daily -  with meals and at bedtime for 14 days. 560 mL 0   . tacrolimus (PROGRAF) 1 MG capsule Take 6 mg by mouth at bedtime. Take 5 mg every morning   03/03/2021 at Unknown time  . tacrolimus (PROGRAF) 5 MG capsule Take 5 mg by mouth daily. Take 5 mg every morning and 6 mg every evening   03/03/2021 at Unknown time  . calcitRIOL (ROCALTROL) 0.5 MCG capsule Resume As per outpatient nephrology. (Patient not taking: Reported on 02/20/2021)     . iron polysaccharides (NIFEREX) 150 MG capsule Take 150 mg by mouth daily. (Patient not taking: Reported on 02/20/2021)     . Multiple Vitamins-Minerals (MULTIVITAMIN ADULT) TABS Take 1 tablet by mouth daily. (Patient not taking: Reported on 02/20/2021)     . multivitamin (RENA-VIT) TABS tablet Take 1 tablet by mouth daily. (Patient not taking: Reported on 02/20/2021)        Vitals   Vitals:   03/05/21 1831  03/05/21 1834 03/05/21 2126 03/06/21 0619  BP: 103/78 103/78 99/69 98/78   Pulse: 91 94 89 86  Resp: 18 18 16 14   Temp:  98.3 F (36.8 C) 98.2 F (36.8 C) 98.2 F (36.8 C)  TempSrc:  Oral Oral Oral  SpO2: 98% 98% 98% 98%  Weight:      Height:         Body mass index is 18.76 kg/m.  Physical Exam   General: Laying comfortably in bedside chair; in no acute distress.  HENT: Normal oropharynx and mucosa. Normal external appearance of ears and nose.  Neck: Supple, no pain or tenderness  CV: No JVD. No peripheral edema.  Pulmonary:  Symmetric Chest rise. Normal respiratory effort.  Abdomen: Soft to touch, non-tender.  Ext: No cyanosis, edema, or deformity  Skin: No rash. Normal palpation of skin.   Musculoskeletal: Normal digits and nails by inspection. No clubbing.   Neurologic Examination  Mental status/Cognition: Alert, oriented to self, place, month and year, good attention. Able to follow all commands Speech/language: Fluent, comprehension intact, object naming intact, repetition intact.  Cranial nerves:   CN II Pupils equal and reactive to light, only minimal central vision present in Left eye   CN III,IV,VI EOM intact, no gaze preference or deviation, no nystagmus    CN V normal sensation in V1, V2, and V3 segments bilaterally    CN VII no asymmetry, no nasolabial fold flattening    CN VIII normal hearing to speech    CN IX & X normal palatal elevation, no uvular deviation    CN XI 5/5 head turn and 5/5 shoulder shrug bilaterally    CN XII midline tongue protrusion    Motor: does have total body weakness that is proportionate must likely due to moderate malnutrition that he has. Reports no sudden decrease from baseline. RUE: 4/5  LUE: 4/5 RLE: 4/5  LLE: 4/5 Muscle bulk: reduced, tone normal, no pronator drift or tremor present   Sensation:  Light touch Intact bilaterally   Pin prick    Temperature    Vibration   Proprioception    Coordination/Complex Motor:  - Finger to Nose intact bilaterally - Heel to shin intact bilaterally - Rapid alternating movement intact bilaterally - Gait: deferred  Labs   CBC:  Recent Labs  Lab 03/04/21 1623 03/05/21 0314  WBC 9.3 8.7  NEUTROABS 6.3  --   HGB 8.8* 8.3*  HCT 25.5* 23.2*  MCV 87.0 83.2  PLT 156 132*    Basic Metabolic Panel:  Lab Results  Component Value Date   NA 133 (L) 03/05/2021   K 4.6 03/05/2021   CO2 25 03/05/2021   GLUCOSE 96 03/05/2021   BUN 37 (H) 03/05/2021   CREATININE 10.69 (H) 03/05/2021   CALCIUM 9.2 03/05/2021    GFRNONAA 6 (L) 03/05/2021   GFRAA 13 (L) 08/25/2018   Lipid Panel:  Lab Results  Component Value Date   LDLCALC 54 03/05/2021   HgbA1c:  Lab Results  Component Value Date   HGBA1C 5.3 03/05/2021   Urine Drug Screen: No results found for: LABOPIA, COCAINSCRNUR, LABBENZ, AMPHETMU, THCU, LABBARB  Alcohol Level     Component Value Date/Time   ETH <10 03/04/2021 1623    CT Head without contrast: No evidence of acute intracranial abnormality. A small chronic infarct is questioned within the right cerebellar hemisphere.   CT angio Head and Neck with contrast: No large vessel occlusion, significant stenosis, or aneurysm in the head and neck. Emphysema  MRI Brain- ordered  2D Echo with bubble study- ordered  Impression   Akeen A Katich is a 45 y.o. male with PMH significant for HTN, HLD, Heart transplant at age 72, ESRD with HD on [facility]. His neurologic examination is notable for only vision changes in his Left eye. He is otherwise neurologically intact. Due to abandoned pericardial leads he cannot have an MRI done. Given his event happened on Saturday and CTA from yesterday showed no other changes no further head imaging needs to be done. Will stop his Aspirin and begin Plavix, will not do a loading dose. After 2D Echo is done no further neurological work up will need to be done.  Recommendations  Stroke/TIA Workup  - CTA- no large vessel occlusion or significant stenosis - TTE w/ bubble- ordered - Already on Pravastatin and LDL within goal 54 - Change from 81 mg Aspirin to Plavix - q4 hr neuro checks - STAT head CT for any change in neuro exam - Tele - PT/OT recommend no follow up - Stroke education - Amb referral to neurology upon discharge   Neurology will sign off once the Echo is read   ______________________________________________________________________   Fatima Sanger MD Resident

## 2021-03-06 NOTE — Progress Notes (Signed)
[facility] NEPHROLOGY PROGRESS NOTE  Assessment/ Plan: Pt is a 45 y.o. yo male  with history of hypertension, nonischemic cardiomyopathy status post BiVAD and heart transplant in 12/2019, COVID-pneumonia requiring mechanical intubation, ESRD on HD TTS at [facility] presented with vision loss on left eye, seen as a consultation for the management of dialysis.  #Left eye vision changes with TIA work-up: Seen by stroke team.  CT scan with no acute finding.  Echocardiogram with possible prosthetic valve endocarditis/aortic root abscess, sent blood culture.  Management as per primary team and stroke team.  # ESRD: TTS schedule at [facility]: Status post dialysis yesterday with 1 L UF, tolerated well.  Regular dialysis tomorrow.   AV fistula for the access.  # Hypertension:  Blood pressure and volume status acceptable.  Continue current cardiac medication.  # Anemia of ESRD: Hemoglobin below goal but is stable, need record for iron and ESA treatment from outpatient.  Monitor hemoglobin.  # Metabolic Bone Disease: Continue PhosLo.  Monitor electrolytes.   Subjective: Seen and examined at bedside.  Tolerated dialysis well.  Denies headache, dizziness, nausea vomiting chest pain shortness of breath. Objective Vital signs in last 24 hours: Vitals:   03/05/21 1831 03/05/21 1834 03/05/21 2126 03/06/21 0619  BP: 103/78 103/78 99/69 98/78   Pulse: 91 94 89 86  Resp: 18 18 16 14   Temp:  98.3 F (36.8 C) 98.2 F (36.8 C) 98.2 F (36.8 C)  TempSrc:  Oral Oral Oral  SpO2: 98% 98% 98% 98%  Weight:      Height:       Weight change: 0.764 kg  Intake/Output Summary (Last 24 hours) at 03/06/2021 0830 Last data filed at 03/05/2021 1700 Gross per 24 hour  Intake --  Output 1000 ml  Net -1000 ml       Labs: Basic Metabolic Panel: Recent Labs  Lab 03/04/21 1623 03/05/21 0314  NA 134* 133*  K 4.3 4.6  CL 95* 93*  CO2 25 25  GLUCOSE 97 96  BUN 35* 37*  CREATININE 9.59*  10.69*  CALCIUM 9.6 9.2   Liver Function Tests: Recent Labs  Lab 03/04/21 1623 03/05/21 0314  AST 13* 11*  ALT 17 14  ALKPHOS 59 49  BILITOT 0.4 0.4  PROT 7.3 5.9*  ALBUMIN 2.8* 2.3*   No results for input(s): LIPASE, AMYLASE in the last 168 hours. No results for input(s): AMMONIA in the last 168 hours. CBC: Recent Labs  Lab 03/04/21 1623 03/05/21 0314  WBC 9.3 8.7  NEUTROABS 6.3  --   HGB 8.8* 8.3*  HCT 25.5* 23.2*  MCV 87.0 83.2  PLT 156 132*   Cardiac Enzymes: No results for input(s): CKTOTAL, CKMB, CKMBINDEX, TROPONINI in the last 168 hours. CBG: No results for input(s): GLUCAP in the last 168 hours.  Iron Studies: No results for input(s): IRON, TIBC, TRANSFERRIN, FERRITIN in the last 72 hours. Studies/Results: CT Angio Head W or Wo Contrast  Result Date: 03/04/2021 CLINICAL DATA:  Left eye vision loss for 5 days. EXAM: CT ANGIOGRAPHY HEAD AND NECK TECHNIQUE: Multidetector CT imaging of the head and neck was performed using the standard protocol during bolus administration of intravenous contrast. Multiplanar CT image reconstructions and MIPs were obtained to evaluate the vascular anatomy. Carotid stenosis measurements (when applicable) are obtained utilizing NASCET criteria, using the distal internal carotid diameter as the denominator. CONTRAST:  19mL OMNIPAQUE IOHEXOL 350 MG/ML SOLN COMPARISON:  None. FINDINGS: CTA NECK FINDINGS Aortic arch: Normal variant aortic  arch branching pattern with common origin of the brachiocephalic and left common carotid arteries. Right carotid system: Patent without evidence of stenosis, dissection, or significant atherosclerosis. Left carotid system: Patent without evidence of stenosis, dissection, or significant atherosclerosis. Vertebral arteries: Patent without evidence of stenosis, dissection, or significant atherosclerosis. Skeleton: Edentulous. Moderate multilevel disc space narrowing in the cervical spine. Other neck: No evidence of  cervical lymphadenopathy or mass. Upper chest: Mild centrilobular emphysema in the lung apices with incompletely imaged asymmetric subpleural reticulation in the right upper lobe which may reflect scarring/mild fibrosis. Review of the MIP images confirms the above findings CTA HEAD FINDINGS Anterior circulation: The internal carotid arteries are widely patent from skull base to carotid termini. ACAs and MCAs are patent without evidence of a proximal branch occlusion or significant proximal stenosis. No aneurysm is identified. Posterior circulation: The intracranial vertebral arteries are widely patent to the basilar. Patent PICA and SCA origins are seen bilaterally. The basilar artery is widely patent. Posterior communicating arteries are diminutive or absent. Both PCAs are patent without evidence of a significant proximal stenosis. No aneurysm is identified. Venous sinuses: Patent. Anatomic variants: None. Review of the MIP images confirms the above findings IMPRESSION: 1. No large vessel occlusion, significant stenosis, or aneurysm in the head and neck. 2. Emphysema (ICD10-J43.9). Electronically Signed   By: Logan Bores M.D.   On: 03/04/2021 17:39   CT Head Wo Contrast  Result Date: 03/04/2021 CLINICAL DATA:  Neuro deficit, acute, stroke suspected. Additional history provided: Vision loss in left eye 5 days. EXAM: CT HEAD WITHOUT CONTRAST TECHNIQUE: Contiguous axial images were obtained from the base of the skull through the vertex without intravenous contrast. COMPARISON:  Report from head CT 08/06/2007 (images currently unavailable). FINDINGS: Brain: Cerebral volume is normal for age. A small chronic infarct is questioned within the right cerebellar hemisphere (for instance as seen on series 4, image 52). There is no acute intracranial hemorrhage. No demarcated cortical infarct. No extra-axial fluid collection. No evidence of intracranial mass. No midline shift. Partially empty sella turcica. Vascular: No  hyperdense vessel. Skull: Normal. Negative for fracture or focal lesion. Sinuses/Orbits:a Visualized orbits show no acute finding. No significant paranasal sinus disease at the imaged levels. IMPRESSION: No evidence of acute intracranial abnormality. A small chronic infarct is questioned within the right cerebellar hemisphere. Electronically Signed   By: Kellie Simmering DO   On: 03/04/2021 15:42   CT Angio Neck W and/or Wo Contrast  Result Date: 03/04/2021 CLINICAL DATA:  Left eye vision loss for 5 days. EXAM: CT ANGIOGRAPHY HEAD AND NECK TECHNIQUE: Multidetector CT imaging of the head and neck was performed using the standard protocol during bolus administration of intravenous contrast. Multiplanar CT image reconstructions and MIPs were obtained to evaluate the vascular anatomy. Carotid stenosis measurements (when applicable) are obtained utilizing NASCET criteria, using the distal internal carotid diameter as the denominator. CONTRAST:  68mL OMNIPAQUE IOHEXOL 350 MG/ML SOLN COMPARISON:  None. FINDINGS: CTA NECK FINDINGS Aortic arch: Normal variant aortic arch branching pattern with common origin of the brachiocephalic and left common carotid arteries. Right carotid system: Patent without evidence of stenosis, dissection, or significant atherosclerosis. Left carotid system: Patent without evidence of stenosis, dissection, or significant atherosclerosis. Vertebral arteries: Patent without evidence of stenosis, dissection, or significant atherosclerosis. Skeleton: Edentulous. Moderate multilevel disc space narrowing in the cervical spine. Other neck: No evidence of cervical lymphadenopathy or mass. Upper chest: Mild centrilobular emphysema in the lung apices with incompletely imaged asymmetric subpleural reticulation in  the right upper lobe which may reflect scarring/mild fibrosis. Review of the MIP images confirms the above findings CTA HEAD FINDINGS Anterior circulation: The internal carotid arteries are widely  patent from skull base to carotid termini. ACAs and MCAs are patent without evidence of a proximal branch occlusion or significant proximal stenosis. No aneurysm is identified. Posterior circulation: The intracranial vertebral arteries are widely patent to the basilar. Patent PICA and SCA origins are seen bilaterally. The basilar artery is widely patent. Posterior communicating arteries are diminutive or absent. Both PCAs are patent without evidence of a significant proximal stenosis. No aneurysm is identified. Venous sinuses: Patent. Anatomic variants: None. Review of the MIP images confirms the above findings IMPRESSION: 1. No large vessel occlusion, significant stenosis, or aneurysm in the head and neck. 2. Emphysema (ICD10-J43.9). Electronically Signed   By: Logan Bores M.D.   On: 03/04/2021 17:39   ECHOCARDIOGRAM COMPLETE  Result Date: 03/05/2021    ECHOCARDIOGRAM REPORT   Patient Name:   KYLAND NO Date of Exam: 03/05/2021 Medical Rec #:  [id number]          Height:       71.0 in Accession #:    [id number]         Weight:       135.0 lb Date of Birth:  May 21, 1976           BSA:          1.784 m Patient Age:    69 years           BP:           117/94 mmHg Patient Gender: M                  HR:           105 bpm. Exam Location:  Inpatient Procedure: 2D Echo, Cardiac Doppler and Color Doppler Indications:     Stroke  History:         Patient has no prior history of Echocardiogram examinations.                  Heart transplant, Pacemaker and Prior Cardiac Surgery; Risk                  Factors:Hypertension and Current Smoker. Heart Transplant                  12/25/1999                  Freestyle Aortic Valve/Root 27 mm 01/2019.                  Aortic Valve: 27 mm Medtronic homograft valve is present in the                  aortic position. Procedure Date: 01/2019.  Sonographer:     Cammy Brochure Referring Phys:  [id number] [place] Diagnosing Phys: Eleonore Chiquito MD IMPRESSIONS  1. 27 mm  Freestyle AoV/Root replacement 01/2019. 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 There is a also a small mobile mass under the NCC/LCC best seen in the PLAX that measures 0.7 cm x 0.7 cm. 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 The echo report from [facility] was reviewed via [place] (12/08/2020), where they metion possible prosthetic valve dehisensce, but this is  not a stented valve. Suspect, this is related to root abscess based on appearance. TEE was not performed at [facility]. Would recommend TEE or gated cardiac CT for further clarification. Would also recommend blood cultures. The aortic valve has been repaired/replaced. Aortic valve regurgitation is not visualized. There is a 27 mm Medtronic homograft valve present in the aortic position. Procedure Date: 01/2019.  2. Left ventricular ejection fraction, by estimation, is 50 to 55%. The left ventricle has low normal function. The left ventricle has no regional wall motion abnormalities. There is moderate concentric left ventricular hypertrophy. Left ventricular diastolic function could not be evaluated.  3. Right ventricular systolic function is mildly reduced. The right ventricular size is normal. There is moderately elevated pulmonary artery systolic pressure. The estimated right ventricular systolic pressure is 83.3 mmHg.  4. Biatrial enlargement is present likely due to prior heart transplant in 2001 that was likely performed usring a biatrial technique. Left atrial size was severely dilated.  5. Right atrial size was severely dilated.  6. The mitral valve is grossly normal. Moderate mitral valve regurgitation.  7. The inferior vena cava is dilated in size with >50% respiratory variability, suggesting right atrial pressure of 8 mmHg.  8. Prior aortic root/AoV replacement 27 mm  fresstyle. Concerns for aortic root abscess detailed in the report. Aortic root/ascending aorta has been repaired/replaced. FINDINGS  Left Ventricle: Left ventricular ejection fraction, by estimation, is 50 to 55%. The left ventricle has low normal function. The left ventricle has no regional wall motion abnormalities. The left ventricular internal cavity size was normal in size. There is moderate concentric left ventricular hypertrophy. Abnormal (paradoxical) septal motion consistent with post-operative status. Left ventricular diastolic function could not be evaluated due to abnormal septal motion. Left ventricular diastolic function could not be evaluated. Right Ventricle: The right ventricular size is normal. No increase in right ventricular wall thickness. Right ventricular systolic function is mildly reduced. There is moderately elevated pulmonary artery systolic pressure. The tricuspid regurgitant velocity is 3.25 m/s, and with an assumed right atrial pressure of 8 mmHg, the estimated right ventricular systolic pressure is 82.5 mmHg. Left Atrium: Biatrial enlargement is present likely due to prior heart transplant in 2001 that was likely performed usring a biatrial technique. Left atrial size was severely dilated. Right Atrium: Right atrial size was severely dilated. Pericardium: Trivial pericardial effusion is present. Mitral Valve: The mitral valve is grossly normal. There is mild thickening of the mitral valve leaflet(s). Moderate mitral valve regurgitation. MV peak gradient, 19.5 mmHg. The mean mitral valve gradient is 7.0 mmHg. Tricuspid Valve: The tricuspid valve is grossly normal. Tricuspid valve regurgitation is mild . No evidence of tricuspid stenosis. Aortic Valve: 27 mm Freestyle AoV/Root replacement 01/2019. 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 There is a also a small mobile mass under the NCC/LCC best  seen in the PLAX that measures 0.7 cm x 0.7 cm. 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 The echo report from [facility] was reviewed via [place] (12/08/2020), where they metion possible prosthetic valve dehisensce, but this is not a stented valve. Suspect, this is related to root abscess based on appearance. TEE was not performed at [facility]. Would recommend TEE or gated cardiac CT for further clarification. Would also recommend blood cultures. The aortic valve has been repaired/replaced. Aortic valve regurgitation is not visualized. Aortic valve mean  gradient measures 12.0 mmHg. Aortic valve peak gradient measures 18.1 mmHg. Aortic valve area, by VTI measures 1.32 cm. There is a 27 mm Medtronic homograft valve present in the aortic position. Procedure Date: 01/2019. Pulmonic Valve: The pulmonic valve was grossly normal. Pulmonic valve regurgitation is mild. No evidence of pulmonic stenosis. Aorta: Prior aortic root/AoV replacement 27 mm fresstyle. Concerns for aortic root abscess detailed in the report. The aortic root/ascending aorta has been repaired/replaced. Venous: The inferior vena cava is dilated in size with greater than 50% respiratory variability, suggesting right atrial pressure of 8 mmHg. IAS/Shunts: The atrial septum is grossly normal.  LEFT VENTRICLE PLAX 2D LVIDd:         4.20 cm LVIDs:         3.00 cm LV PW:         1.20 cm LV IVS:        0.93 cm LVOT diam:     2.40 cm LV SV:         56 LV SV Index:   31 LVOT Area:     4.52 cm  LV Volumes (MOD) LV vol d, MOD A2C: 100.0 ml LV vol d, MOD A4C: 104.0 ml LV vol s, MOD A2C: 51.2 ml LV vol s, MOD A4C: 53.3 ml LV SV MOD A2C:     48.8 ml LV SV MOD A4C:     104.0 ml LV SV MOD BP:      53.7 ml RIGHT VENTRICLE            IVC RV S prime:     8.70 cm/s  IVC diam: 3.10 cm LEFT ATRIUM              Index        RIGHT ATRIUM           Index LA Vol (A2C):   245.0 ml 137.32 ml/m  RA Area:     18.80 cm LA Vol (A4C):   206.0 ml 115.46 ml/m RA Volume:   49.50 ml  27.74 ml/m LA Biplane Vol: 224.0 ml 125.55 ml/m  AORTIC VALVE AV Area (Vmax):    1.27 cm AV Area (Vmean):   1.14 cm AV Area (VTI):     1.32 cm AV Vmax:           213.00 cm/s AV Vmean:          163.000 cm/s AV VTI:            0.423 m AV Peak Grad:      18.1 mmHg AV Mean Grad:      12.0 mmHg LVOT Vmax:         59.60 cm/s LVOT Vmean:        41.100 cm/s LVOT VTI:          0.123 m LVOT/AV VTI ratio: 0.29  AORTA Ao Asc diam: 3.30 cm MITRAL VALVE                TRICUSPID VALVE MV Area (PHT): 3.99 cm     TR Peak grad:   42.2 mmHg MV Area VTI:   1.61 cm     TR Vmax:        325.00 cm/s MV Peak grad:  19.5 mmHg MV Mean grad:  7.0 mmHg     SHUNTS MV Vmax:       2.21 m/s     Systemic VTI:  0.12 m MV Vmean:      117.0 cm/s   Systemic  Diam: 2.40 cm MV Decel Time: 190 msec MV E velocity: 192.00 cm/s Eleonore Chiquito MD Electronically signed by Eleonore Chiquito MD Signature Date/Time: 03/05/2021/3:00:03 PM    Final (Updated)     Medications: Infusions:   Scheduled Medications: . amitriptyline  10 mg Oral QHS  . calcium acetate  2,001 mg Oral TID WC  . carvedilol  6.25 mg Oral BID WC  . Chlorhexidine Gluconate Cloth  6 each Topical Q0600  . clopidogrel  75 mg Oral Daily  . colestipol  1 g Oral Daily  . diltiazem  360 mg Oral Daily  . famotidine  10 mg Oral QHS  . gabapentin  100 mg Oral TID  . melatonin  4.5 mg Oral QHS  . multivitamin  1 tablet Oral Daily  . mycophenolate  500 mg Oral BID  . nicotine  14 mg Transdermal Daily  . pantoprazole  40 mg Oral BID  . pravastatin  40 mg Oral q1800  . predniSONE  5 mg Oral Q breakfast  . sirolimus  1 mg Oral Daily  . tacrolimus  5 mg Oral Daily  . tacrolimus  6 mg Oral QHS    have reviewed scheduled and prn medications.  Physical Exam: General:NAD, comfortable Heart:RRR, s1s2 nl Lungs:clear b/l, no crackle Abdomen:soft, Non-tender, non-distended Extremities:No edema Dialysis  Access: AV fistula with good thrill and bruit.  Elisabel Hanover Prasad Rodriques Badie 03/06/2021,8:30 AM  LOS: 0 days

## 2021-03-07 ENCOUNTER — Inpatient Hospital Stay (HOSPITAL_COMMUNITY): Payer: Medicare Other

## 2021-03-07 DIAGNOSIS — E785 Hyperlipidemia, unspecified: Secondary | ICD-10-CM | POA: Diagnosis present

## 2021-03-07 DIAGNOSIS — I442 Atrioventricular block, complete: Secondary | ICD-10-CM | POA: Diagnosis present

## 2021-03-07 DIAGNOSIS — D84821 Immunodeficiency due to drugs: Secondary | ICD-10-CM | POA: Diagnosis not present

## 2021-03-07 DIAGNOSIS — J984 Other disorders of lung: Secondary | ICD-10-CM | POA: Diagnosis not present

## 2021-03-07 DIAGNOSIS — N250 Renal osteodystrophy: Secondary | ICD-10-CM | POA: Diagnosis not present

## 2021-03-07 DIAGNOSIS — I509 Heart failure, unspecified: Secondary | ICD-10-CM | POA: Diagnosis not present

## 2021-03-07 DIAGNOSIS — Z7982 Long term (current) use of aspirin: Secondary | ICD-10-CM | POA: Diagnosis not present

## 2021-03-07 DIAGNOSIS — B6989 Cysticercosis of other sites: Secondary | ICD-10-CM | POA: Diagnosis not present

## 2021-03-07 DIAGNOSIS — Z8249 Family history of ischemic heart disease and other diseases of the circulatory system: Secondary | ICD-10-CM | POA: Diagnosis not present

## 2021-03-07 DIAGNOSIS — R931 Abnormal findings on diagnostic imaging of heart and coronary circulation: Secondary | ICD-10-CM | POA: Diagnosis not present

## 2021-03-07 DIAGNOSIS — I120 Hypertensive chronic kidney disease with stage 5 chronic kidney disease or end stage renal disease: Secondary | ICD-10-CM | POA: Diagnosis present

## 2021-03-07 DIAGNOSIS — R7881 Bacteremia: Secondary | ICD-10-CM | POA: Diagnosis not present

## 2021-03-07 DIAGNOSIS — E875 Hyperkalemia: Secondary | ICD-10-CM | POA: Diagnosis not present

## 2021-03-07 DIAGNOSIS — E440 Moderate protein-calorie malnutrition: Secondary | ICD-10-CM | POA: Diagnosis present

## 2021-03-07 DIAGNOSIS — H3412 Central retinal artery occlusion, left eye: Secondary | ICD-10-CM | POA: Diagnosis not present

## 2021-03-07 DIAGNOSIS — I132 Hypertensive heart and chronic kidney disease with heart failure and with stage 5 chronic kidney disease, or end stage renal disease: Secondary | ICD-10-CM | POA: Diagnosis not present

## 2021-03-07 DIAGNOSIS — I400 Infective myocarditis: Secondary | ICD-10-CM | POA: Diagnosis present

## 2021-03-07 DIAGNOSIS — H349 Unspecified retinal vascular occlusion: Secondary | ICD-10-CM | POA: Diagnosis not present

## 2021-03-07 DIAGNOSIS — J9811 Atelectasis: Secondary | ICD-10-CM | POA: Diagnosis not present

## 2021-03-07 DIAGNOSIS — Z9049 Acquired absence of other specified parts of digestive tract: Secondary | ICD-10-CM | POA: Diagnosis not present

## 2021-03-07 DIAGNOSIS — I330 Acute and subacute infective endocarditis: Secondary | ICD-10-CM

## 2021-03-07 DIAGNOSIS — F17210 Nicotine dependence, cigarettes, uncomplicated: Secondary | ICD-10-CM | POA: Diagnosis present

## 2021-03-07 DIAGNOSIS — D696 Thrombocytopenia, unspecified: Secondary | ICD-10-CM | POA: Diagnosis not present

## 2021-03-07 DIAGNOSIS — I440 Atrioventricular block, first degree: Secondary | ICD-10-CM | POA: Diagnosis not present

## 2021-03-07 DIAGNOSIS — M898X9 Other specified disorders of bone, unspecified site: Secondary | ICD-10-CM | POA: Diagnosis present

## 2021-03-07 DIAGNOSIS — D631 Anemia in chronic kidney disease: Secondary | ICD-10-CM | POA: Diagnosis present

## 2021-03-07 DIAGNOSIS — N186 End stage renal disease: Secondary | ICD-10-CM | POA: Diagnosis not present

## 2021-03-07 DIAGNOSIS — H5462 Unqualified visual loss, left eye, normal vision right eye: Secondary | ICD-10-CM | POA: Diagnosis present

## 2021-03-07 DIAGNOSIS — J432 Centrilobular emphysema: Secondary | ICD-10-CM | POA: Diagnosis present

## 2021-03-07 DIAGNOSIS — Z992 Dependence on renal dialysis: Secondary | ICD-10-CM | POA: Diagnosis not present

## 2021-03-07 DIAGNOSIS — Z950 Presence of cardiac pacemaker: Secondary | ICD-10-CM | POA: Diagnosis not present

## 2021-03-07 DIAGNOSIS — H547 Unspecified visual loss: Secondary | ICD-10-CM | POA: Diagnosis not present

## 2021-03-07 DIAGNOSIS — Z4821 Encounter for aftercare following heart transplant: Secondary | ICD-10-CM | POA: Diagnosis not present

## 2021-03-07 DIAGNOSIS — K2100 Gastro-esophageal reflux disease with esophagitis, without bleeding: Secondary | ICD-10-CM | POA: Diagnosis present

## 2021-03-07 DIAGNOSIS — I4510 Unspecified right bundle-branch block: Secondary | ICD-10-CM | POA: Diagnosis not present

## 2021-03-07 DIAGNOSIS — Z8616 Personal history of COVID-19: Secondary | ICD-10-CM | POA: Diagnosis not present

## 2021-03-07 DIAGNOSIS — D509 Iron deficiency anemia, unspecified: Secondary | ICD-10-CM | POA: Diagnosis not present

## 2021-03-07 DIAGNOSIS — Z20822 Contact with and (suspected) exposure to covid-19: Secondary | ICD-10-CM | POA: Diagnosis present

## 2021-03-07 DIAGNOSIS — E1140 Type 2 diabetes mellitus with diabetic neuropathy, unspecified: Secondary | ICD-10-CM | POA: Diagnosis present

## 2021-03-07 DIAGNOSIS — Z681 Body mass index (BMI) 19 or less, adult: Secondary | ICD-10-CM | POA: Diagnosis not present

## 2021-03-07 DIAGNOSIS — I38 Endocarditis, valve unspecified: Secondary | ICD-10-CM | POA: Diagnosis present

## 2021-03-07 DIAGNOSIS — Z952 Presence of prosthetic heart valve: Secondary | ICD-10-CM | POA: Diagnosis not present

## 2021-03-07 DIAGNOSIS — Z941 Heart transplant status: Secondary | ICD-10-CM | POA: Diagnosis not present

## 2021-03-07 DIAGNOSIS — E1122 Type 2 diabetes mellitus with diabetic chronic kidney disease: Secondary | ICD-10-CM | POA: Diagnosis present

## 2021-03-07 LAB — CBC WITH DIFFERENTIAL/PLATELET
Abs Immature Granulocytes: 0.05 10*3/uL (ref 0.00–0.07)
Basophils Absolute: 0 10*3/uL (ref 0.0–0.1)
Basophils Relative: 0 %
Eosinophils Absolute: 0.2 10*3/uL (ref 0.0–0.5)
Eosinophils Relative: 2 %
HCT: 22.7 % — ABNORMAL LOW (ref 39.0–52.0)
Hemoglobin: 8 g/dL — ABNORMAL LOW (ref 13.0–17.0)
Immature Granulocytes: 1 %
Lymphocytes Relative: 19 %
Lymphs Abs: 1.7 10*3/uL (ref 0.7–4.0)
MCH: 29.5 pg (ref 26.0–34.0)
MCHC: 35.2 g/dL (ref 30.0–36.0)
MCV: 83.8 fL (ref 80.0–100.0)
Monocytes Absolute: 0.9 10*3/uL (ref 0.1–1.0)
Monocytes Relative: 10 %
Neutro Abs: 6.1 10*3/uL (ref 1.7–7.7)
Neutrophils Relative %: 68 %
Platelets: 104 10*3/uL — ABNORMAL LOW (ref 150–400)
RBC: 2.71 MIL/uL — ABNORMAL LOW (ref 4.22–5.81)
RDW: 14.8 % (ref 11.5–15.5)
WBC: 8.9 10*3/uL (ref 4.0–10.5)
nRBC: 0 % (ref 0.0–0.2)

## 2021-03-07 LAB — HEPATITIS B SURFACE ANTIGEN: Hepatitis B Surface Ag: NONREACTIVE

## 2021-03-07 LAB — RENAL FUNCTION PANEL
Albumin: 2.4 g/dL — ABNORMAL LOW (ref 3.5–5.0)
Anion gap: 15 (ref 5–15)
BUN: 43 mg/dL — ABNORMAL HIGH (ref 6–20)
CO2: 25 mmol/L (ref 22–32)
Calcium: 9.5 mg/dL (ref 8.9–10.3)
Chloride: 94 mmol/L — ABNORMAL LOW (ref 98–111)
Creatinine, Ser: 9.3 mg/dL — ABNORMAL HIGH (ref 0.61–1.24)
GFR, Estimated: 7 mL/min — ABNORMAL LOW (ref >60)
Glucose, Bld: 84 mg/dL (ref 70–99)
Phosphorus: 8.3 mg/dL — ABNORMAL HIGH (ref 2.5–4.6)
Potassium: 5.8 mmol/L — ABNORMAL HIGH (ref 3.5–5.1)
Sodium: 134 mmol/L — ABNORMAL LOW (ref 135–145)

## 2021-03-07 LAB — HEPATITIS B CORE ANTIBODY, TOTAL: Hep B Core Total Ab: NONREACTIVE

## 2021-03-07 LAB — HIV ANTIBODY (ROUTINE TESTING W REFLEX): HIV Screen 4th Generation wRfx: NONREACTIVE

## 2021-03-07 MED ORDER — SODIUM CHLORIDE 0.9 % IV SOLN
1.0000 g | INTRAVENOUS | Status: DC
  Administered 2021-03-07: 1 g via INTRAVENOUS
  Filled 2021-03-07 (×2): qty 1

## 2021-03-07 MED ORDER — VANCOMYCIN HCL 750 MG/150ML IV SOLN: 750.0000 mg | INTRAVENOUS | Status: DC

## 2021-03-07 MED ORDER — MIDODRINE HCL 5 MG PO TABS
10.0000 mg | ORAL_TABLET | ORAL | Status: AC
  Administered 2021-03-07: 10 mg via ORAL
  Filled 2021-03-07: qty 2

## 2021-03-07 MED ORDER — ONDANSETRON HCL 4 MG/2ML IJ SOLN
INTRAMUSCULAR | Status: AC
  Administered 2021-03-07: 4 mg via INTRAVENOUS
  Filled 2021-03-07: qty 2

## 2021-03-07 MED ORDER — VANCOMYCIN HCL 1500 MG/300ML IV SOLN
1500.0000 mg | Freq: Once | INTRAVENOUS | Status: AC
  Administered 2021-03-07: 1500 mg via INTRAVENOUS
  Filled 2021-03-07 (×2): qty 300

## 2021-03-07 NOTE — Progress Notes (Signed)
Pharmacy Antibiotic Note  Antonio Powell is a 45 y.o. male admitted on 03/04/2021 with possibleendocarditis. Hx of heart transplant at age 56 and subsequent valve replacements. ESRD w/ HD on TThS. ECHO on 4/28 hihgly concerning for prosthetic valve endocarditis. Patient on waitlist for transfer to [facility] where his transplant occurred and cardiologist works. Pharmacy has been consulted for vancomycin and cefepime dosing.   Plan: Cefepime 1g IV q24h  Vancomycin 1500mg  Iv x1, then 750 mg qHD  - Monitor clinical improvement, LOT, culture data for de-escalation - Monitor HD schedule   Height: 5\' 11"  (180.3 cm) Weight: 61 kg (134 lb 7.7 oz) IBW/kg (Calculated) : 75.3  Temp (24hrs), Avg:98.4 F (36.9 C), Min:98.3 F (36.8 C), Max:98.5 F (36.9 C)  Recent Labs  Lab 03/04/21 1623 03/05/21 0314 03/07/21 0132  WBC 9.3 8.7 8.9  CREATININE 9.59* 10.69*  --     Estimated Creatinine Clearance: 7.8 mL/min (A) (by C-G formula based on SCr of 10.69 mg/dL (H)).    Allergies  Allergen Reactions  . Nsaids     Cant take due to heart transplant  . Tolmetin Other (See Comments)    Cant take due to heart transplant  . Varenicline Other (See Comments)    Vivid dreams  . Phenergan [Promethazine Hcl] Anxiety    Antimicrobials this admission: Vancomycin 4/30 >> Cefepime 4/30 >>   Dose adjustments this admission:   Microbiology results: 4/28 Bcx: ngtd  Thank you for allowing pharmacy to be a part of this patient's care.  Claudina Lick, PharmD PGY1 Acute Care Pharmacy Resident 03/07/2021 8:33 AM  Please check [email or web address] for unit-specific pharmacy phone numbers.

## 2021-03-07 NOTE — Consult Note (Signed)
[facility]    Date of Admission:  03/04/2021     Reason for Consult: endocarditis, immunosuppressed    Referring Provider: Darliss Cheney   Lines:  Old left forearm avf  Abx: 4/30-c vanc 4/30-c cefepime        Assessment: Endocarditis with myocardial abscess S/p heart transplant 2001 S/p AVR since cardiac transplant S/p PPM for complete heart block (2020) Immunosuppression -- tacro 5/6; pred 5; sirolimus 1 esrd on iHD tts Prior severe covid infection 11/2020; has had vaccines prior but no booster  Per [facility] criteria, possible endocarditis  Of note, I reviewed his [facility] note and there was mention of prior AV hypermobility/possible dehiscence on 11/2020. With the acute vision loss though, it is prudent to w/u for infectious endocarditis  He should get a tee given bioprosthesis and presence of PPM, although that is to likely be done at [facility]  At this time, patient doesn't appear to be in clinical heart failure     Plan: 1. Agree with starting empiric vanc/cefepime now that patient has blood cx and in setting of concerning tte finding 2. Adding RF/aso labs 3. Given immunosuppression and aortic valve prosthesis, I would also send atypical organisms including brucella, legionella, bartonella, coxiella, crypto/aspergillus (marijuanna use)/endemic fungi serology, and afb blood culture 4. As vision loss could also due to be non IE, will also get syphilis serology 5. HCM labs to include hiv screen 6. Agree transfer to tertiary center (await [facility] transfer); should get a tee there along with ongoing cards/id consult      ------------------------------------------------ Principal Problem:   Retinal artery occlusion Active Problems:   Heart transplant recipient [facility])   ESRD (end stage renal disease) ([place])   Gastroesophageal reflux disease with esophagitis without hemorrhage   Protein-calorie malnutrition,  moderate (HCC)   Tobacco use disorder    HPI: Antonio Powell is a 45 y.o. male pmh HTN, HLD, s/p cardiac transplant in 2001 on stable immunosuppression tacro/sirolimus/pred, s/p aortic valve replacement, esrd on iHD TTS, admitted for w/u of [place]ct[place] unilateral vision loss found to have tte suggestion of AV endocarditis/aortic root abscess  Patient developed acute painless vision loss left eye 4 days prior to admission, not associated with headache, focal weakness/numbness, fever or chill, or facial pain or sinusitis sx.   Due to not having transportation, he finally saw his pcp 4/27 who sent him to ophthalmology from which he was told to go to the ED which he did at [facility]  Denies chronic b sx otherwise Denies rash, other joint pain, myalgia  Patient ultimately admitted Afebrile; hds cta head/neck negative A tte was done for embolic w/u which showed possible AV endocarditis with aortic root abscess Blood cx obtained and has been ngtd On hd#3, id called. Patient started on vanc/cefepime empirically  He remains with vision loss, no eye pain, no sign of endophthalmitis or headache  Patient was admitted on 4/15-16 for abd pain/n/v. Ct abd pelv at that time nonspecific distal colon thickening given augmentin 8 days. Sx gone by this admission  Patient does smoke black and milds, does not drink any alcohol, and uses marijuana 3-4 times per week. Patient is vaccinated for COVID.   Other id epi No unpasteurized product No tb risk No brucella/qfever obvious risk factors No recent travel   fam hx: No lupus  Social History   Tobacco Use  . Smoking status: Current Every Day Smoker  Packs/day: 0.50    Types: Cigarettes  . Smokeless tobacco: Never Used  . Tobacco comment: black and mild  Vaping Use  . Vaping Use: Never used  Substance Use Topics  . Alcohol use: Yes    Comment: occ  . Drug use: Yes    Frequency: 2.0 times per week    Types: Marijuana    Allergies   Allergen Reactions  . Nsaids     Cant take due to heart transplant  . Tolmetin Other (See Comments)    Cant take due to heart transplant  . Varenicline Other (See Comments)    Vivid dreams  . Phenergan [Promethazine Hcl] Anxiety    Review of Systems: ROS All Other ROS was negative, except mentioned above   Past Medical History:  Diagnosis Date  . Heart transplant recipient [facility])   . Hypertension        Scheduled Meds: . amitriptyline  10 mg Oral QHS  . calcium acetate  2,001 mg Oral TID WC  . carvedilol  6.25 mg Oral BID WC  . Chlorhexidine Gluconate Cloth  6 each Topical Q0600  . Chlorhexidine Gluconate Cloth  6 each Topical Q0600  . clopidogrel  75 mg Oral Daily  . colestipol  1 g Oral Daily  . diltiazem  360 mg Oral Daily  . famotidine  10 mg Oral QHS  . gabapentin  100 mg Oral TID  . melatonin  4.5 mg Oral QHS  . midodrine  10 mg Oral NOW  . multivitamin  1 tablet Oral Daily  . mycophenolate  500 mg Oral BID  . nicotine  14 mg Transdermal Daily  . pantoprazole  40 mg Oral BID  . pravastatin  40 mg Oral q1800  . predniSONE  5 mg Oral Q breakfast  . sirolimus  1 mg Oral Daily  . tacrolimus  5 mg Oral Daily  . tacrolimus  6 mg Oral QHS   Continuous Infusions: . ceFEPime (MAXIPIME) IV    . vancomycin     Followed by  . [START ON 03/10/2021] vancomycin     PRN Meds:.acetaminophen **OR** acetaminophen (TYLENOL) oral liquid 160 mg/5 mL **OR** acetaminophen, calcium acetate, cyclobenzaprine, hydrALAZINE, loperamide, ondansetron (ZOFRAN) IV, oxyCODONE   OBJECTIVE: Blood pressure (!) 89/71, pulse 89, temperature 98.4 F (36.9 C), resp. rate 16, height 5\' 11"  (1.803 m), weight 61 kg, SpO2 92 %.  Physical Exam General/constitutional: no distress, pleasant HEENT: Normocephalic, PER, Conj Clear, EOMI, Oropharynx clear Neck supple CV: rrr no mrg Lungs: clear to auscultation, normal respiratory effort Abd: Soft, Nontender Ext: no edema Skin: No Rash Neuro:  nonfocal MSK: no peripheral joint swelling/tenderness/warmth; back spines nontender Psych alert/oriented    Lab Results Lab Results  Component Value Date   WBC 8.9 03/07/2021   HGB 8.0 (L) 03/07/2021   HCT 22.7 (L) 03/07/2021   MCV 83.8 03/07/2021   PLT 104 (L) 03/07/2021    Lab Results  Component Value Date   CREATININE 10.69 (H) 03/05/2021   BUN 37 (H) 03/05/2021   NA 133 (L) 03/05/2021   K 4.6 03/05/2021   CL 93 (L) 03/05/2021   CO2 25 03/05/2021    Lab Results  Component Value Date   ALT 14 03/05/2021   AST 11 (L) 03/05/2021   ALKPHOS 49 03/05/2021   BILITOT 0.4 03/05/2021      Microbiology: Recent Results (from the past 240 hour(s))  Resp Panel by RT-PCR (Flu A&B, Covid) Nasopharyngeal Swab     Status: None  Collection Time: 03/04/21  4:02 PM   Specimen: Nasopharyngeal Swab; Nasopharyngeal(NP) swabs in vial transport medium  Result Value Ref Range Status   SARS Coronavirus 2 by RT PCR NEGATIVE NEGATIVE Final    Comment: (NOTE) SARS-CoV-2 target nucleic acids are NOT DETECTED.  The SARS-CoV-2 RNA is generally detectable in upper respiratory specimens during the acute phase of infection. The lowest concentration of SARS-CoV-2 viral copies this assay can detect is 138 copies/mL. A negative result does not preclude SARS-Cov-2 infection and should not be used as the sole basis for treatment or other patient management decisions. A negative result may occur with  improper specimen collection/handling, submission of specimen other than nasopharyngeal swab, presence of viral mutation(s) within the areas targeted by this assay, and inadequate number of viral copies(<138 copies/mL). A negative result must be combined with clinical observations, patient history, and epidemiological information. The expected result is Negative.  Fact Sheet for Patients:  EntrepreneurPulse.com.au  Fact Sheet for Healthcare Providers:   IncredibleEmployment.be  This test is no t yet approved or cleared by the [place] FDA and  has been authorized for detection and/or diagnosis of SARS-CoV-2 by FDA under an Emergency Use Authorization (EUA). This EUA will remain  in effect (meaning this test can be used) for the duration of the COVID-19 declaration under Section 564(b)(1) of the Act, 21 U.S.C.section 360bbb-3(b)(1), unless the authorization is terminated  or revoked sooner.       Influenza A by PCR NEGATIVE NEGATIVE Final   Influenza B by PCR NEGATIVE NEGATIVE Final    Comment: (NOTE) The Xpert Xpress SARS-CoV-2/FLU/RSV plus assay is intended as an aid in the diagnosis of influenza from Nasopharyngeal swab specimens and should not be used as a sole basis for treatment. Nasal washings and aspirates are unacceptable for Xpert Xpress SARS-CoV-2/FLU/RSV testing.  Fact Sheet for Patients: EntrepreneurPulse.com.au  Fact Sheet for Healthcare Providers: IncredibleEmployment.be  This test is not yet approved or cleared by the [place] FDA and has been authorized for detection and/or diagnosis of SARS-CoV-2 by FDA under an Emergency Use Authorization (EUA). This EUA will remain in effect (meaning this test can be used) for the duration of the COVID-19 declaration under Section 564(b)(1) of the Act, 21 U.S.C. section 360bbb-3(b)(1), unless the authorization is terminated or revoked.  Performed at [facility], [address], [place], [place] [address]   Culture, blood (routine x 2)     Status: None (Preliminary result)   Collection Time: 03/05/21  5:36 PM   Specimen: BLOOD LEFT HAND  Result Value Ref Range Status   Specimen Description BLOOD LEFT HAND  Final   Special Requests   Final    BOTTLES DRAWN AEROBIC ONLY Blood Culture results may not be optimal due to an inadequate volume of blood received in culture bottles   Culture   Final    NO GROWTH 2  DAYS Performed at [facility], [place] [address] [place], [place] [address]    Report Status PENDING  Incomplete  Culture, blood (routine x 2)     Status: None (Preliminary result)   Collection Time: 03/05/21  5:45 PM   Specimen: BLOOD LEFT HAND  Result Value Ref Range Status   Specimen Description BLOOD LEFT HAND  Final   Special Requests   Final    BOTTLES DRAWN AEROBIC ONLY Blood Culture results may not be optimal due to an inadequate volume of blood received in culture bottles   Culture   Final  NO GROWTH 2 DAYS Performed at [facility], [place] [address], [place], [place] [address]    Report Status PENDING  Incomplete     Serology:  Micro: 4/30 blood cx in progress 4/28 bcx ngtd  Imaging: If present, new imagings (plain films, ct scans, and mri) have been personally visualized and interpreted; radiology reports have been reviewed. Decision making incorporated into the Impression / Recommendations.  4/28 tte 1. 27 mm Freestyle AoV/Root replacement 01/2019. 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 There is a also a small  mobile mass under the NCC/LCC best seen in the  PLAX that measures 0.7 cm x 0.7 cm. These findings are highly concerning  for prosthetic valve endocarditis with involvement of the prosthetic  aortic root. The echo report from [facility] was reviewed via [place] (12/08/2020), where they metion  possible prosthetic valve dehisensce, but this is not a stented valve.  Suspect, this is related to root abscess based on appearance. TEE was not  performed at [facility]. Would recommend TEE or gated cardiac CT for  further clarification. Would also  recommend blood cultures. The aortic valve has been repaired/replaced.  Aortic valve regurgitation is not visualized. There is a 27 mm [employer]  homograft valve present in the aortic position. Procedure  Date: 01/2019.  2. Left ventricular ejection fraction, by estimation, is 50 to 55%. The  left ventricle has low normal function. The left ventricle has no regional  wall motion abnormalities. There is moderate concentric left ventricular  hypertrophy. Left ventricular  diastolic function could not be evaluated.  3. Right ventricular systolic function is mildly reduced. The right  ventricular size is normal. There is moderately elevated pulmonary artery  systolic pressure. The estimated right ventricular systolic pressure is  92.4 mmHg.  4. Biatrial enlargement is present likely due to prior heart transplant  in 2001 that was likely performed usring a biatrial technique. Left atrial  size was severely dilated.  5. Right atrial size was severely dilated.  6. The mitral valve is grossly normal. Moderate mitral valve  regurgitation.  7. The inferior vena cava is dilated in size with >50% respiratory  variability, suggesting right atrial pressure of 8 mmHg.  8. Prior aortic root/AoV replacement 27 mm fresstyle. Concerns for aortic  root abscess detailed in the report. Aortic root/ascending aorta has been  repaired/replaced.   4/27 cta head/neck 1. No large vessel occlusion, significant stenosis, or aneurysm in the head and neck. 2. Emphysema   4/15 abd/pelv ct 1. Segmental wall thickening of the distal rectosigmoid colon, consistent with inflammatory or infectious colitis. 2. Mural thickening of the gastric antrum and pylorus, which could reflect gastritis or peptic ulcer disease. 3. Postsurgical changes from cardiac transplant. 4. Sequela of end-stage renal disease. 5.  Aortic Atherosclerosis  Jabier Mutton, [place] [facility] [place] [phone] pager    03/07/2021, 10:17 AM

## 2021-03-07 NOTE — Progress Notes (Signed)
[facility] NEPHROLOGY PROGRESS NOTE  Assessment/ Plan: Pt is a 45 y.o. yo male  with history of hypertension, nonischemic cardiomyopathy status post BiVAD and heart transplant in 12/2019, COVID-pneumonia requiring mechanical intubation, ESRD on HD TTS at [facility] presented with vision loss on left eye, seen as a consultation for the management of dialysis.  #Left eye vision changes with TIA work-up: Seen by stroke team.  CT scan with no acute finding.  Echocardiogram with possible prosthetic valve endocarditis/aortic root abscess, blood culture no growth so far.  Plan to transfer to [facility] noted.  Management as per primary team and stroke team.  # ESRD: TTS schedule at [facility]: Plan for regular dialysis.  Volume status acceptable.  Blood pressure is low therefore we will order midodrine before HD, lower UF Goal.   AV fistula for the access.  # Hypertension/volume:  Blood pressure is low therefore starting midodrine.  Volume ok.  Need to lower cardiac medications.  # Anemia of ESRD: Hemoglobin below goal but is stable, need record for iron and ESA treatment from outpatient.  Monitor hemoglobin.  # Metabolic Bone Disease: Continue PhosLo.  Monitor electrolytes.  Subjective: Seen and examined at bedside.  No new event.  Denies nausea vomiting chest pain or shortness of breath. Objective Vital signs in last 24 hours: Vitals:   03/06/21 0619 03/06/21 1134 03/06/21 1947 03/07/21 0600  BP: 98/78 98/72 93/68  (!) 89/71  Pulse: 86 85 86 89  Resp: 14 16    Temp: 98.2 F (36.8 C) 98.3 F (36.8 C) 98.5 F (36.9 C) 98.4 F (36.9 C)  TempSrc: Oral     SpO2: 98% 95% 96% 92%  Weight:      Height:       Weight change:  No intake or output data in the 24 hours ending 03/07/21 0908     Labs: Basic Metabolic Panel: Recent Labs  Lab 03/04/21 1623 03/05/21 0314  NA 134* 133*  K 4.3 4.6  CL 95* 93*  CO2 25 25  GLUCOSE 97 96  BUN 35* 37*  CREATININE  9.59* 10.69*  CALCIUM 9.6 9.2   Liver Function Tests: Recent Labs  Lab 03/04/21 1623 03/05/21 0314  AST 13* 11*  ALT 17 14  ALKPHOS 59 49  BILITOT 0.4 0.4  PROT 7.3 5.9*  ALBUMIN 2.8* 2.3*   No results for input(s): LIPASE, AMYLASE in the last 168 hours. No results for input(s): AMMONIA in the last 168 hours. CBC: Recent Labs  Lab 03/04/21 1623 03/05/21 0314 03/07/21 0132  WBC 9.3 8.7 8.9  NEUTROABS 6.3  --  6.1  HGB 8.8* 8.3* 8.0*  HCT 25.5* 23.2* 22.7*  MCV 87.0 83.2 83.8  PLT 156 132* 104*   Cardiac Enzymes: No results for input(s): CKTOTAL, CKMB, CKMBINDEX, TROPONINI in the last 168 hours. CBG: No results for input(s): GLUCAP in the last 168 hours.  Iron Studies: No results for input(s): IRON, TIBC, TRANSFERRIN, FERRITIN in the last 72 hours. Studies/Results: ECHOCARDIOGRAM COMPLETE  Result Date: 03/05/2021    ECHOCARDIOGRAM REPORT   Patient Name:   Antonio Powell Date of Exam: 03/05/2021 Medical Rec #:  [id number]          Height:       71.0 in Accession #:    [id number]         Weight:       135.0 lb Date of Birth:  December 07, 1975  BSA:          1.784 m Patient Age:    44 years           BP:           117/94 mmHg Patient Gender: M                  HR:           105 bpm. Exam Location:  Inpatient Procedure: 2D Echo, Cardiac Doppler and Color Doppler Indications:     Stroke  History:         Patient has no prior history of Echocardiogram examinations.                  Heart transplant, Pacemaker and Prior Cardiac Surgery; Risk                  Factors:Hypertension and Current Smoker. Heart Transplant                  12/25/1999                  Freestyle Aortic Valve/Root 27 mm 01/2019.                  Aortic Valve: 27 mm Medtronic homograft valve is present in the                  aortic position. Procedure Date: 01/2019.  Sonographer:     Cammy Brochure Referring Phys:  [id number] [place] Diagnosing Phys: Eleonore Chiquito MD IMPRESSIONS  1. 27 mm Freestyle  AoV/Root replacement 01/2019. 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 There is a also a small mobile mass under the NCC/LCC best seen in the PLAX that measures 0.7 cm x 0.7 cm. 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 The echo report from [facility] was reviewed via [place] (12/08/2020), where they metion possible prosthetic valve dehisensce, but this is not a stented valve. Suspect, this is related to root abscess based on appearance. TEE was not performed at [facility]. Would recommend TEE or gated cardiac CT for further clarification. Would also recommend blood cultures. The aortic valve has been repaired/replaced. Aortic valve regurgitation is not visualized. There is a 27 mm Medtronic homograft valve present in the aortic position. Procedure Date: 01/2019.  2. Left ventricular ejection fraction, by estimation, is 50 to 55%. The left ventricle has low normal function. The left ventricle has no regional wall motion abnormalities. There is moderate concentric left ventricular hypertrophy. Left ventricular diastolic function could not be evaluated.  3. Right ventricular systolic function is mildly reduced. The right ventricular size is normal. There is moderately elevated pulmonary artery systolic pressure. The estimated right ventricular systolic pressure is 07.3 mmHg.  4. Biatrial enlargement is present likely due to prior heart transplant in 2001 that was likely performed usring a biatrial technique. Left atrial size was severely dilated.  5. Right atrial size was severely dilated.  6. The mitral valve is grossly normal. Moderate mitral valve regurgitation.  7. The inferior vena cava is dilated in size with >50% respiratory variability, suggesting right atrial pressure of 8 mmHg.  8. Prior aortic root/AoV replacement 27 mm fresstyle.  Concerns for aortic root abscess detailed in the report. Aortic root/ascending aorta has been repaired/replaced. FINDINGS  Left Ventricle: Left ventricular  ejection fraction, by estimation, is 50 to 55%. The left ventricle has low normal function. The left ventricle has no regional wall motion abnormalities. The left ventricular internal cavity size was normal in size. There is moderate concentric left ventricular hypertrophy. Abnormal (paradoxical) septal motion consistent with post-operative status. Left ventricular diastolic function could not be evaluated due to abnormal septal motion. Left ventricular diastolic function could not be evaluated. Right Ventricle: The right ventricular size is normal. No increase in right ventricular wall thickness. Right ventricular systolic function is mildly reduced. There is moderately elevated pulmonary artery systolic pressure. The tricuspid regurgitant velocity is 3.25 m/s, and with an assumed right atrial pressure of 8 mmHg, the estimated right ventricular systolic pressure is 02.4 mmHg. Left Atrium: Biatrial enlargement is present likely due to prior heart transplant in 2001 that was likely performed usring a biatrial technique. Left atrial size was severely dilated. Right Atrium: Right atrial size was severely dilated. Pericardium: Trivial pericardial effusion is present. Mitral Valve: The mitral valve is grossly normal. There is mild thickening of the mitral valve leaflet(s). Moderate mitral valve regurgitation. MV peak gradient, 19.5 mmHg. The mean mitral valve gradient is 7.0 mmHg. Tricuspid Valve: The tricuspid valve is grossly normal. Tricuspid valve regurgitation is mild . No evidence of tricuspid stenosis. Aortic Valve: 27 mm Freestyle AoV/Root replacement 01/2019. 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 There is a also a small mobile mass under the NCC/LCC best seen in the  PLAX that measures 0.7 cm x 0.7 cm. 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 The echo report from [facility] was reviewed via [place] (12/08/2020), where they metion possible prosthetic valve dehisensce, but this is not a stented valve. Suspect, this is related to root abscess based on appearance. TEE was not performed at [facility]. Would recommend TEE or gated cardiac CT for further clarification. Would also recommend blood cultures. The aortic valve has been repaired/replaced. Aortic valve regurgitation is not visualized. Aortic valve mean gradient measures 12.0 mmHg. Aortic valve peak gradient measures 18.1 mmHg. Aortic valve area, by VTI measures 1.32 cm. There is a 27 mm Medtronic homograft valve present in the aortic position. Procedure Date: 01/2019. Pulmonic Valve: The pulmonic valve was grossly normal. Pulmonic valve regurgitation is mild. No evidence of pulmonic stenosis. Aorta: Prior aortic root/AoV replacement 27 mm fresstyle. Concerns for aortic root abscess detailed in the report. The aortic root/ascending aorta has been repaired/replaced. Venous: The inferior vena cava is dilated in size with greater than 50% respiratory variability, suggesting right atrial pressure of 8 mmHg. IAS/Shunts: The atrial septum is grossly normal.  LEFT VENTRICLE PLAX 2D LVIDd:         4.20 cm LVIDs:         3.00 cm LV PW:         1.20 cm LV IVS:        0.93 cm LVOT diam:     2.40 cm LV SV:         56 LV SV Index:   31 LVOT Area:     4.52 cm  LV Volumes (MOD) LV vol d, MOD A2C: 100.0 ml LV vol d, MOD A4C: 104.0 ml LV vol s, MOD A2C: 51.2 ml LV vol s, MOD A4C: 53.3 ml LV SV MOD A2C:     48.8 ml LV SV MOD A4C:  104.0 ml LV SV MOD BP:      53.7 ml RIGHT VENTRICLE            IVC RV S prime:     8.70 cm/s  IVC diam: 3.10 cm LEFT ATRIUM              Index        RIGHT ATRIUM           Index LA Vol (A2C):   245.0 ml 137.32 ml/m RA Area:      18.80 cm LA Vol (A4C):   206.0 ml 115.46 ml/m RA Volume:   49.50 ml  27.74 ml/m LA Biplane Vol: 224.0 ml 125.55 ml/m  AORTIC VALVE AV Area (Vmax):    1.27 cm AV Area (Vmean):   1.14 cm AV Area (VTI):     1.32 cm AV Vmax:           213.00 cm/s AV Vmean:          163.000 cm/s AV VTI:            0.423 m AV Peak Grad:      18.1 mmHg AV Mean Grad:      12.0 mmHg LVOT Vmax:         59.60 cm/s LVOT Vmean:        41.100 cm/s LVOT VTI:          0.123 m LVOT/AV VTI ratio: 0.29  AORTA Ao Asc diam: 3.30 cm MITRAL VALVE                TRICUSPID VALVE MV Area (PHT): 3.99 cm     TR Peak grad:   42.2 mmHg MV Area VTI:   1.61 cm     TR Vmax:        325.00 cm/s MV Peak grad:  19.5 mmHg MV Mean grad:  7.0 mmHg     SHUNTS MV Vmax:       2.21 m/s     Systemic VTI:  0.12 m MV Vmean:      117.0 cm/s   Systemic Diam: 2.40 cm MV Decel Time: 190 msec MV E velocity: 192.00 cm/s Eleonore Chiquito MD Electronically signed by Eleonore Chiquito MD Signature Date/Time: 03/05/2021/3:00:03 PM    Final (Updated)     Medications: Infusions: . ceFEPime (MAXIPIME) IV    . vancomycin     Followed by  . [START ON 03/10/2021] vancomycin      Scheduled Medications: . amitriptyline  10 mg Oral QHS  . calcium acetate  2,001 mg Oral TID WC  . carvedilol  6.25 mg Oral BID WC  . Chlorhexidine Gluconate Cloth  6 each Topical Q0600  . Chlorhexidine Gluconate Cloth  6 each Topical Q0600  . clopidogrel  75 mg Oral Daily  . colestipol  1 g Oral Daily  . diltiazem  360 mg Oral Daily  . famotidine  10 mg Oral QHS  . gabapentin  100 mg Oral TID  . melatonin  4.5 mg Oral QHS  . multivitamin  1 tablet Oral Daily  . mycophenolate  500 mg Oral BID  . nicotine  14 mg Transdermal Daily  . pantoprazole  40 mg Oral BID  . pravastatin  40 mg Oral q1800  . predniSONE  5 mg Oral Q breakfast  . sirolimus  1 mg Oral Daily  . tacrolimus  5 mg Oral Daily  . tacrolimus  6 mg Oral QHS    have reviewed scheduled and  prn medications.  Physical  Exam: General:NAD, able to lie flat comfortable Heart:RRR, s1s2 nl Lungs:clear b/l, no crackle Abdomen:soft, Non-tender, non-distended Extremities:No edema Dialysis Access: AV fistula with good thrill and bruit.  Findley Blankenbaker Reesa Chew Jezebel Pollet 03/07/2021,9:08 AM  LOS: 0 days

## 2021-03-07 NOTE — Plan of Care (Signed)
  Problem: Education: Goal: Knowledge of General Education information will improve Description Including pain rating scale, medication(s)/side effects and non-pharmacologic comfort measures Outcome: Progressing   

## 2021-03-07 NOTE — Progress Notes (Signed)
PROGRESS NOTE    Antonio Powell  MOQ:[id number] DOB: Apr 24, 1976 DOA: 03/04/2021 PCP: Medicine, [facility]   Brief Narrative:  Antonio Powell  is a 45 y.o. male, with history of HTN, HLD, Heart transplant at age 3 followed by TAVR at [facility], ESRD with HD on T TH S, and more presents to the ED with a chief complaint of loss of vision in the left eye.  Patient reports that on 23 April he had sudden onset of bright light in his left eye followed by complete darkness.  This was painless.   No other associated complaint.   The vision never came back. He had to wait till he can get a ride so on 03/04/2021, he was able to go see a provider.  He reports he saw his PCP who sent him to an ophthalmologist.  The ophthalmologist gave him eyedrops and sent him to the ER.  No previous history of such. He was admitted for colitis about a week ago, but reports that his nausea vomiting has cleared up since then.    In the ED, he was hemodynamically stable. CT head shows no acute intracranial abnormality.  Small chronic infarct right cerebellar hemisphere. CTA head and neck shows no large vessel occlusion, significant stenosis or aneurysm in the head or neck. Dr. Leonel Ramsay was consulted and advised admission for stroke work-up including A1c, lipids, echo, CTA head and neck (done above), and MRI brain. Patient does have a pacemaker and was transferred to [facility] as there was no MRI at [facility].  Unfortunately,MRI could not be done due to him having abandoned epicardial wires.  Seen by neurology.  They had nothing to offer however they switched from aspirin to Plavix.  Patient's vision remains impaired on the left side.  Transthoracic echo indicates possibility of aortic root abscess and possible vegetation/endocarditis.  This could very well explain possible retinal artery thromboembolism causing occlusion.  Consulted cardiology.  Received a call from [place] of cardiology that Dr. Haroldine Laws has  reviewed the case and has recommended to transfer the patient to [facility] since he had transplant there and he has cardiologist there.  Discussed with Dr. Davina Poke at [facility] and then discussed with Dr. Alric Ran cardiologist at [facility] who has accepted the patient in transfer.  Also called the transfer line.  No beds available.  Patient on the waiting list for transfer.  Blood cultures drawn and negative so far.  ID consulted and patient started on cefepime and vancomycin per their recommendations.   Assessment & Plan:   Principal Problem:   Retinal artery occlusion Active Problems:   Heart transplant recipient [facility])   ESRD (end stage renal disease) ([place])   Gastroesophageal reflux disease with esophagitis without hemorrhage   Protein-calorie malnutrition, moderate (HCC)   Tobacco use disorder  Sudden painless vision loss on the left eye: No other focal deficit. CTA head and neck negative for any occlusion. Unfortunately,MRI cannot be done due to him having abandoned epicardial wires.  Seen by neurology.  They have nothing to offer.  Patient's vision remains impaired on the left side.  Transthoracic echo yesterday indicates possibility of aortic root abscess and possible vegetation/endocarditis.  This could very well explain possible presumption of retinal artery occlusion secondary to septic thromboembolism.  Vision remains impaired.  Possible aortic root abscess/endocarditis: Transthoracic echo shows aortic root abscess and possible endocarditis.  Blood culture drawn yesterday remain negative.  Consulted cardiology yesterday.  Received a call from [place] of  cardiology that Dr. Haroldine Laws has reviewed the case and has recommended to transfer the patient to [facility] since he had transplant there and he has cardiologist there.  Discussed with Dr. Davina Poke at [facility] and then discussed with Dr. Alric Ran cardiologist at [facility] who has accepted the patient in transfer.  Also called the  transfer line.  No beds available.  Patient on the waiting list for transfer.  Primary nurse aware.  No other signs of infection.  Blood culture remains negative.  Consulted ID and per the recommendations of starting on cefepime and vancomycin.  Defer further management to them.  ESRD: Gets dialysis on TTS.  Nephrology on board.  Protein calorie malnutrition: Albumin 2.8.  Encourage nutrition.  Anemia of chronic disease: Hemoglobin is stable.  History of heart transplant: Continue antirejection medication as well as prednisone.  Hyperlipidemia: Continue statin and Colestid.  Essential hypertension: Blood pressure remains on the lower side now.  However he remains asymptomatic.  Continue Coreg and diltiazem but hold hydralazine.  GERD: Continue PPI and famotidine.  Tobacco dependence: Smoking cessation provided.  DVT prophylaxis: SCD's Start: 03/04/21 2343, per patient he is allergic to heparin.   Code Status: Full Code  Family Communication: None present at bedside.  Plan of care discussed with patient in length and he verbalized understanding and agreed with it.  Status is: Observation  The patient will require care spanning > 2 midnights and should be moved to inpatient because: Ongoing diagnostic testing needed not appropriate for outpatient work up  Dispo: The patient is from: Home              Anticipated d/c is to: [facility]              Patient currently is not medically stable to d/c.   Difficult to place patient No        Estimated body mass index is 18.76 kg/m as calculated from the following:   Height as of this encounter: 5\' 11"  (1.803 m).   Weight as of this encounter: 61 kg.      Nutritional status:               Consultants:   Neurology and nephrology and ID  Procedures:   None  Antimicrobials:  Anti-infectives (From admission, onward)   Start     Dose/Rate Route Frequency Ordered Stop   03/10/21 1800  vancomycin  (VANCOREADY) IVPB 750 mg/150 mL       "Followed by" Linked Group Details   750 mg 150 mL/hr over 60 Minutes Intravenous Every T-Th-Sa (1800) 03/07/21 0842     03/07/21 2000  ceFEPIme (MAXIPIME) 1 g in sodium chloride 0.9 % 100 mL IVPB        1 g 200 mL/hr over 30 Minutes Intravenous Every 24 hours 03/07/21 0842     03/07/21 2000  vancomycin (VANCOREADY) IVPB 1500 mg/300 mL       "Followed by" Linked Group Details   1,500 mg 150 mL/hr over 120 Minutes Intravenous  Once 03/07/21 0842           Subjective: Patient seen and examined.  No new complaint.  Vision remains impaired on the left eye.  Objective: Vitals:   03/06/21 0619 03/06/21 1134 03/06/21 1947 03/07/21 0600  BP: 98/78 98/72 93/68  (!) 89/71  Pulse: 86 85 86 89  Resp: 14 16    Temp: 98.2 F (36.8 C) 98.3 F (36.8 C) 98.5 F (36.9 C) 98.4 F (36.9 C)  TempSrc: Oral     SpO2: 98% 95% 96% 92%  Weight:      Height:       No intake or output data in the 24 hours ending 03/07/21 1141 Filed Weights   03/04/21 1412 03/05/21 1325 03/05/21 1700  Weight: 61.2 kg 62 kg 61 kg    Examination:  General exam: Appears calm and comfortable  Respiratory system: Clear to auscultation. Respiratory effort normal. Cardiovascular system: S1 & S2 heard, RRR. No JVD, murmurs, rubs, gallops or clicks. No pedal edema. Gastrointestinal system: Abdomen is nondistended, soft and nontender. No organomegaly or masses felt. Normal bowel sounds heard. Central nervous system: Alert and oriented.  Visual acuity decreased in left eye.  No focal neurological deficits. Extremities: Symmetric 5 x 5 power. Skin: No rashes, lesions or ulcers.  Psychiatry: Judgement and insight appear normal. Mood & affect appropriate.    Data Reviewed: I have personally reviewed following labs and imaging studies  CBC: Recent Labs  Lab 03/04/21 1623 03/05/21 0314 03/07/21 0132  WBC 9.3 8.7 8.9  NEUTROABS 6.3  --  6.1  HGB 8.8* 8.3* 8.0*  HCT 25.5* 23.2*  22.7*  MCV 87.0 83.2 83.8  PLT 156 132* 371*   Basic Metabolic Panel: Recent Labs  Lab 03/04/21 1623 03/05/21 0314  NA 134* 133*  K 4.3 4.6  CL 95* 93*  CO2 25 25  GLUCOSE 97 96  BUN 35* 37*  CREATININE 9.59* 10.69*  CALCIUM 9.6 9.2   GFR: Estimated Creatinine Clearance: 7.8 mL/min (A) (by C-G formula based on SCr of 10.69 mg/dL (H)). Liver Function Tests: Recent Labs  Lab 03/04/21 1623 03/05/21 0314  AST 13* 11*  ALT 17 14  ALKPHOS 59 49  BILITOT 0.4 0.4  PROT 7.3 5.9*  ALBUMIN 2.8* 2.3*   No results for input(s): LIPASE, AMYLASE in the last 168 hours. No results for input(s): AMMONIA in the last 168 hours. Coagulation Profile: Recent Labs  Lab 03/04/21 1623  INR 1.1   Cardiac Enzymes: No results for input(s): CKTOTAL, CKMB, CKMBINDEX, TROPONINI in the last 168 hours. BNP (last 3 results) No results for input(s): PROBNP in the last 8760 hours. HbA1C: Recent Labs    03/05/21 0314  HGBA1C 5.3   CBG: No results for input(s): GLUCAP in the last 168 hours. Lipid Profile: Recent Labs    03/05/21 0314  CHOL 138  HDL 71  LDLCALC 54  TRIG 64  CHOLHDL 1.9   Thyroid Function Tests: No results for input(s): TSH, T4TOTAL, FREET4, T3FREE, THYROIDAB in the last 72 hours. Anemia Panel: No results for input(s): VITAMINB12, FOLATE, FERRITIN, TIBC, IRON, RETICCTPCT in the last 72 hours. Sepsis Labs: No results for input(s): PROCALCITON, LATICACIDVEN in the last 168 hours.  Recent Results (from the past 240 hour(s))  Resp Panel by RT-PCR (Flu A&B, Covid) Nasopharyngeal Swab     Status: None   Collection Time: 03/04/21  4:02 PM   Specimen: Nasopharyngeal Swab; Nasopharyngeal(NP) swabs in vial transport medium  Result Value Ref Range Status   SARS Coronavirus 2 by RT PCR NEGATIVE NEGATIVE Final    Comment: (NOTE) SARS-CoV-2 target nucleic acids are NOT DETECTED.  The SARS-CoV-2 RNA is generally detectable in upper respiratory specimens during the acute phase  of infection. The lowest concentration of SARS-CoV-2 viral copies this assay can detect is 138 copies/mL. A negative result does not preclude SARS-Cov-2 infection and should not be used as the sole basis for treatment or other patient management decisions. A negative  result may occur with  improper specimen collection/handling, submission of specimen other than nasopharyngeal swab, presence of viral mutation(s) within the areas targeted by this assay, and inadequate number of viral copies(<138 copies/mL). A negative result must be combined with clinical observations, patient history, and epidemiological information. The expected result is Negative.  Fact Sheet for Patients:  EntrepreneurPulse.com.au  Fact Sheet for Healthcare Providers:  IncredibleEmployment.be  This test is no t yet approved or cleared by the [place] FDA and  has been authorized for detection and/or diagnosis of SARS-CoV-2 by FDA under an Emergency Use Authorization (EUA). This EUA will remain  in effect (meaning this test can be used) for the duration of the COVID-19 declaration under Section 564(b)(1) of the Act, 21 U.S.C.section 360bbb-3(b)(1), unless the authorization is terminated  or revoked sooner.       Influenza A by PCR NEGATIVE NEGATIVE Final   Influenza B by PCR NEGATIVE NEGATIVE Final    Comment: (NOTE) The Xpert Xpress SARS-CoV-2/FLU/RSV plus assay is intended as an aid in the diagnosis of influenza from Nasopharyngeal swab specimens and should not be used as a sole basis for treatment. Nasal washings and aspirates are unacceptable for Xpert Xpress SARS-CoV-2/FLU/RSV testing.  Fact Sheet for Patients: EntrepreneurPulse.com.au  Fact Sheet for Healthcare Providers: IncredibleEmployment.be  This test is not yet approved or cleared by the [place] FDA and has been authorized for detection and/or diagnosis of  SARS-CoV-2 by FDA under an Emergency Use Authorization (EUA). This EUA will remain in effect (meaning this test can be used) for the duration of the COVID-19 declaration under Section 564(b)(1) of the Act, 21 U.S.C. section 360bbb-3(b)(1), unless the authorization is terminated or revoked.  Performed at [facility], [address], [place], [place] [address]   Culture, blood (routine x 2)     Status: None (Preliminary result)   Collection Time: 03/05/21  5:36 PM   Specimen: BLOOD LEFT HAND  Result Value Ref Range Status   Specimen Description BLOOD LEFT HAND  Final   Special Requests   Final    BOTTLES DRAWN AEROBIC ONLY Blood Culture results may not be optimal due to an inadequate volume of blood received in culture bottles   Culture   Final    NO GROWTH 2 DAYS Performed at [facility], [place] [address], [place], [place] [address]    Report Status PENDING  Incomplete  Culture, blood (routine x 2)     Status: None (Preliminary result)   Collection Time: 03/05/21  5:45 PM   Specimen: BLOOD LEFT HAND  Result Value Ref Range Status   Specimen Description BLOOD LEFT HAND  Final   Special Requests   Final    BOTTLES DRAWN AEROBIC ONLY Blood Culture results may not be optimal due to an inadequate volume of blood received in culture bottles   Culture   Final    NO GROWTH 2 DAYS Performed at [facility], [place] [address] [place],  [address]    Report Status PENDING  Incomplete      Radiology Studies: ECHOCARDIOGRAM COMPLETE  Result Date: 03/05/2021    ECHOCARDIOGRAM REPORT   Patient Name:   Antonio Powell Date of Exam: 03/05/2021 Medical Rec #:  [id number]          Height:       71.0 in Accession #:    [id number]         Weight:       135.0 lb Date of  Birth:  [birth year]           BSA:          1.784 m Patient Age:    44 years           BP:           117/94 mmHg Patient Gender: M                  HR:           105 bpm. Exam Location:  Inpatient Procedure: 2D Echo,  Cardiac Doppler and Color Doppler Indications:     Stroke  History:         Patient has no prior history of Echocardiogram examinations.                  Heart transplant, Pacemaker and Prior Cardiac Surgery; Risk                  Factors:Hypertension and Current Smoker. Heart Transplant                  12/25/1999                  Freestyle Aortic Valve/Root 27 mm 01/2019.                  Aortic Valve: 27 mm Medtronic homograft valve is present in the                  aortic position. Procedure Date: 01/2019.  Sonographer:     Cammy Brochure Referring Phys:  [id number] [place] Diagnosing Phys: Eleonore Chiquito MD IMPRESSIONS  1. 27 mm Freestyle AoV/Root replacement 01/2019. 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 There is a also a small mobile mass under the NCC/LCC best seen in the PLAX that measures 0.7 cm x 0.7 cm. 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 The echo report from [facility] was reviewed via [place] (12/08/2020), where they metion possible prosthetic valve dehisensce, but this is not a stented valve. Suspect, this is related to root abscess based on appearance. TEE was not performed at [facility]. Would recommend TEE or gated cardiac CT for further clarification. Would also recommend blood cultures. The aortic valve has been repaired/replaced. Aortic valve regurgitation is not visualized. There is a 27 mm Medtronic homograft valve present in the aortic position. Procedure Date: 01/2019.  2. Left ventricular ejection fraction, by estimation, is 50 to 55%. The left ventricle has low normal function. The left ventricle has no regional wall motion abnormalities. There is moderate concentric left ventricular hypertrophy. Left ventricular diastolic function could not be evaluated.  3. Right ventricular systolic function is  mildly reduced. The right ventricular size is normal. There is moderately elevated pulmonary artery systolic pressure. The estimated right ventricular systolic pressure is 10.6 mmHg.  4. Biatrial enlargement is present likely due to prior heart transplant in 2001 that was likely performed usring a biatrial technique. Left atrial size was severely dilated.  5. Right atrial size was severely dilated.  6. The mitral valve is grossly normal. Moderate mitral valve regurgitation.  7. The inferior vena cava is dilated in size with >50% respiratory variability, suggesting right atrial pressure of 8 mmHg.  8. Prior aortic root/AoV replacement 27 mm fresstyle. Concerns for aortic root abscess detailed in the  report. Aortic root/ascending aorta has been repaired/replaced. FINDINGS  Left Ventricle: Left ventricular ejection fraction, by estimation, is 50 to 55%. The left ventricle has low normal function. The left ventricle has no regional wall motion abnormalities. The left ventricular internal cavity size was normal in size. There is moderate concentric left ventricular hypertrophy. Abnormal (paradoxical) septal motion consistent with post-operative status. Left ventricular diastolic function could not be evaluated due to abnormal septal motion. Left ventricular diastolic function could not be evaluated. Right Ventricle: The right ventricular size is normal. No increase in right ventricular wall thickness. Right ventricular systolic function is mildly reduced. There is moderately elevated pulmonary artery systolic pressure. The tricuspid regurgitant velocity is 3.25 m/s, and with an assumed right atrial pressure of 8 mmHg, the estimated right ventricular systolic pressure is 40.9 mmHg. Left Atrium: Biatrial enlargement is present likely due to prior heart transplant in 2001 that was likely performed usring a biatrial technique. Left atrial size was severely dilated. Right Atrium: Right atrial size was severely dilated.  Pericardium: Trivial pericardial effusion is present. Mitral Valve: The mitral valve is grossly normal. There is mild thickening of the mitral valve leaflet(s). Moderate mitral valve regurgitation. MV peak gradient, 19.5 mmHg. The mean mitral valve gradient is 7.0 mmHg. Tricuspid Valve: The tricuspid valve is grossly normal. Tricuspid valve regurgitation is mild . No evidence of tricuspid stenosis. Aortic Valve: 27 mm Freestyle AoV/Root replacement 01/2019. 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 There is a also a small mobile mass under the NCC/LCC best seen in the PLAX that measures 0.7 cm x 0.7 cm. 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 The echo report from [facility] was reviewed via [place] (12/08/2020), where they metion possible prosthetic valve dehisensce, but this is not a stented valve. Suspect, this is related to root abscess based on appearance. TEE was not performed at [facility]. Would recommend TEE or gated cardiac CT for further clarification. Would also recommend blood cultures. The aortic valve has been repaired/replaced. Aortic valve regurgitation is not visualized. Aortic valve mean gradient measures 12.0 mmHg. Aortic valve peak gradient measures 18.1 mmHg. Aortic valve area, by VTI measures 1.32 cm. There is a 27 mm Medtronic homograft valve present in the aortic position. Procedure Date: 01/2019. Pulmonic Valve: The pulmonic valve was grossly normal. Pulmonic valve regurgitation is mild. No evidence of pulmonic stenosis. Aorta: Prior aortic root/AoV replacement 27 mm fresstyle. Concerns for aortic root abscess detailed in the report. The aortic root/ascending aorta has been repaired/replaced. Venous: The inferior vena cava is dilated in size with greater than 50% respiratory variability, suggesting right atrial  pressure of 8 mmHg. IAS/Shunts: The atrial septum is grossly normal.  LEFT VENTRICLE PLAX 2D LVIDd:         4.20 cm LVIDs:         3.00 cm LV PW:         1.20 cm LV IVS:        0.93 cm LVOT diam:     2.40 cm LV SV:         56 LV SV Index:   31 LVOT Area:     4.52 cm  LV Volumes (MOD) LV vol d, MOD A2C: 100.0 ml LV vol d, MOD A4C: 104.0 ml LV vol s, MOD A2C: 51.2 ml LV vol s, MOD A4C: 53.3 ml LV SV MOD A2C:  48.8 ml LV SV MOD A4C:     104.0 ml LV SV MOD BP:      53.7 ml RIGHT VENTRICLE            IVC RV S prime:     8.70 cm/s  IVC diam: 3.10 cm LEFT ATRIUM              Index        RIGHT ATRIUM           Index LA Vol (A2C):   245.0 ml 137.32 ml/m RA Area:     18.80 cm LA Vol (A4C):   206.0 ml 115.46 ml/m RA Volume:   49.50 ml  27.74 ml/m LA Biplane Vol: 224.0 ml 125.55 ml/m  AORTIC VALVE AV Area (Vmax):    1.27 cm AV Area (Vmean):   1.14 cm AV Area (VTI):     1.32 cm AV Vmax:           213.00 cm/s AV Vmean:          163.000 cm/s AV VTI:            0.423 m AV Peak Grad:      18.1 mmHg AV Mean Grad:      12.0 mmHg LVOT Vmax:         59.60 cm/s LVOT Vmean:        41.100 cm/s LVOT VTI:          0.123 m LVOT/AV VTI ratio: 0.29  AORTA Ao Asc diam: 3.30 cm MITRAL VALVE                TRICUSPID VALVE MV Area (PHT): 3.99 cm     TR Peak grad:   42.2 mmHg MV Area VTI:   1.61 cm     TR Vmax:        325.00 cm/s MV Peak grad:  19.5 mmHg MV Mean grad:  7.0 mmHg     SHUNTS MV Vmax:       2.21 m/s     Systemic VTI:  0.12 m MV Vmean:      117.0 cm/s   Systemic Diam: 2.40 cm MV Decel Time: 190 msec MV E velocity: 192.00 cm/s Eleonore Chiquito MD Electronically signed by Eleonore Chiquito MD Signature Date/Time: 03/05/2021/3:00:03 PM    Final (Updated)     Scheduled Meds: . amitriptyline  10 mg Oral QHS  . calcium acetate  2,001 mg Oral TID WC  . carvedilol  6.25 mg Oral BID WC  . Chlorhexidine Gluconate Cloth  6 each Topical Q0600  . Chlorhexidine Gluconate Cloth  6 each Topical Q0600  . clopidogrel  75 mg Oral Daily  .  colestipol  1 g Oral Daily  . diltiazem  360 mg Oral Daily  . famotidine  10 mg Oral QHS  . gabapentin  100 mg Oral TID  . melatonin  4.5 mg Oral QHS  . midodrine  10 mg Oral NOW  . multivitamin  1 tablet Oral Daily  . mycophenolate  500 mg Oral BID  . nicotine  14 mg Transdermal Daily  . pantoprazole  40 mg Oral BID  . pravastatin  40 mg Oral q1800  . predniSONE  5 mg Oral Q breakfast  . sirolimus  1 mg Oral Daily  . tacrolimus  5 mg Oral Daily  . tacrolimus  6 mg Oral QHS   Continuous Infusions: . ceFEPime (MAXIPIME) IV    . vancomycin  Followed by  . [START ON 03/10/2021] vancomycin       LOS: 0 days   Time spent: 30 minutes   Darliss Cheney, MD [facility]  03/07/2021, 11:41 AM   To contact the attending provider between 7A-7P or the covering provider during after hours 7P-7A, please log into the web site [email or web address].

## 2021-03-08 DIAGNOSIS — R7881 Bacteremia: Secondary | ICD-10-CM | POA: Diagnosis not present

## 2021-03-08 DIAGNOSIS — I132 Hypertensive heart and chronic kidney disease with heart failure and with stage 5 chronic kidney disease, or end stage renal disease: Secondary | ICD-10-CM | POA: Diagnosis not present

## 2021-03-08 DIAGNOSIS — I442 Atrioventricular block, complete: Secondary | ICD-10-CM | POA: Diagnosis not present

## 2021-03-08 DIAGNOSIS — I7789 Other specified disorders of arteries and arterioles: Secondary | ICD-10-CM | POA: Diagnosis not present

## 2021-03-08 DIAGNOSIS — J432 Centrilobular emphysema: Secondary | ICD-10-CM | POA: Diagnosis not present

## 2021-03-08 DIAGNOSIS — H547 Unspecified visual loss: Secondary | ICD-10-CM | POA: Diagnosis not present

## 2021-03-08 DIAGNOSIS — B9689 Other specified bacterial agents as the cause of diseases classified elsewhere: Secondary | ICD-10-CM | POA: Diagnosis not present

## 2021-03-08 DIAGNOSIS — I509 Heart failure, unspecified: Secondary | ICD-10-CM | POA: Diagnosis not present

## 2021-03-08 DIAGNOSIS — I440 Atrioventricular block, first degree: Secondary | ICD-10-CM | POA: Diagnosis not present

## 2021-03-08 DIAGNOSIS — I330 Acute and subacute infective endocarditis: Secondary | ICD-10-CM | POA: Diagnosis not present

## 2021-03-08 DIAGNOSIS — R931 Abnormal findings on diagnostic imaging of heart and coronary circulation: Secondary | ICD-10-CM

## 2021-03-08 DIAGNOSIS — I080 Rheumatic disorders of both mitral and aortic valves: Secondary | ICD-10-CM | POA: Diagnosis not present

## 2021-03-08 DIAGNOSIS — Z8249 Family history of ischemic heart disease and other diseases of the circulatory system: Secondary | ICD-10-CM | POA: Diagnosis not present

## 2021-03-08 DIAGNOSIS — B6989 Cysticercosis of other sites: Secondary | ICD-10-CM | POA: Diagnosis not present

## 2021-03-08 DIAGNOSIS — R636 Underweight: Secondary | ICD-10-CM | POA: Diagnosis not present

## 2021-03-08 DIAGNOSIS — I6522 Occlusion and stenosis of left carotid artery: Secondary | ICD-10-CM | POA: Diagnosis not present

## 2021-03-08 DIAGNOSIS — Z20822 Contact with and (suspected) exposure to covid-19: Secondary | ICD-10-CM | POA: Diagnosis not present

## 2021-03-08 DIAGNOSIS — Z809 Family history of malignant neoplasm, unspecified: Secondary | ICD-10-CM | POA: Diagnosis not present

## 2021-03-08 DIAGNOSIS — Z7982 Long term (current) use of aspirin: Secondary | ICD-10-CM | POA: Diagnosis not present

## 2021-03-08 DIAGNOSIS — H349 Unspecified retinal vascular occlusion: Secondary | ICD-10-CM | POA: Diagnosis not present

## 2021-03-08 DIAGNOSIS — N186 End stage renal disease: Secondary | ICD-10-CM | POA: Diagnosis not present

## 2021-03-08 DIAGNOSIS — R9439 Abnormal result of other cardiovascular function study: Secondary | ICD-10-CM | POA: Diagnosis not present

## 2021-03-08 DIAGNOSIS — D84821 Immunodeficiency due to drugs: Secondary | ICD-10-CM | POA: Diagnosis not present

## 2021-03-08 DIAGNOSIS — N250 Renal osteodystrophy: Secondary | ICD-10-CM | POA: Diagnosis not present

## 2021-03-08 DIAGNOSIS — F17210 Nicotine dependence, cigarettes, uncomplicated: Secondary | ICD-10-CM | POA: Diagnosis not present

## 2021-03-08 DIAGNOSIS — E1122 Type 2 diabetes mellitus with diabetic chronic kidney disease: Secondary | ICD-10-CM | POA: Diagnosis not present

## 2021-03-08 DIAGNOSIS — K5909 Other constipation: Secondary | ICD-10-CM | POA: Diagnosis not present

## 2021-03-08 DIAGNOSIS — T82897D Other specified complication of cardiac prosthetic devices, implants and grafts, subsequent encounter: Secondary | ICD-10-CM | POA: Diagnosis not present

## 2021-03-08 DIAGNOSIS — H3412 Central retinal artery occlusion, left eye: Secondary | ICD-10-CM | POA: Diagnosis not present

## 2021-03-08 DIAGNOSIS — D631 Anemia in chronic kidney disease: Secondary | ICD-10-CM | POA: Diagnosis not present

## 2021-03-08 DIAGNOSIS — M898X9 Other specified disorders of bone, unspecified site: Secondary | ICD-10-CM | POA: Diagnosis not present

## 2021-03-08 DIAGNOSIS — K219 Gastro-esophageal reflux disease without esophagitis: Secondary | ICD-10-CM | POA: Diagnosis not present

## 2021-03-08 DIAGNOSIS — H5462 Unqualified visual loss, left eye, normal vision right eye: Secondary | ICD-10-CM | POA: Diagnosis not present

## 2021-03-08 DIAGNOSIS — E875 Hyperkalemia: Secondary | ICD-10-CM | POA: Diagnosis not present

## 2021-03-08 DIAGNOSIS — Z992 Dependence on renal dialysis: Secondary | ICD-10-CM | POA: Diagnosis not present

## 2021-03-08 DIAGNOSIS — E440 Moderate protein-calorie malnutrition: Secondary | ICD-10-CM | POA: Diagnosis not present

## 2021-03-08 DIAGNOSIS — Z79899 Other long term (current) drug therapy: Secondary | ICD-10-CM | POA: Diagnosis not present

## 2021-03-08 DIAGNOSIS — I400 Infective myocarditis: Secondary | ICD-10-CM | POA: Diagnosis not present

## 2021-03-08 DIAGNOSIS — Z952 Presence of prosthetic heart valve: Secondary | ICD-10-CM | POA: Diagnosis not present

## 2021-03-08 DIAGNOSIS — I358 Other nonrheumatic aortic valve disorders: Secondary | ICD-10-CM | POA: Diagnosis not present

## 2021-03-08 DIAGNOSIS — H53029 Refractive amblyopia, unspecified eye: Secondary | ICD-10-CM | POA: Diagnosis not present

## 2021-03-08 DIAGNOSIS — K2100 Gastro-esophageal reflux disease with esophagitis, without bleeding: Secondary | ICD-10-CM | POA: Diagnosis not present

## 2021-03-08 DIAGNOSIS — E785 Hyperlipidemia, unspecified: Secondary | ICD-10-CM | POA: Diagnosis not present

## 2021-03-08 DIAGNOSIS — D509 Iron deficiency anemia, unspecified: Secondary | ICD-10-CM | POA: Diagnosis not present

## 2021-03-08 DIAGNOSIS — Z833 Family history of diabetes mellitus: Secondary | ICD-10-CM | POA: Diagnosis not present

## 2021-03-08 DIAGNOSIS — Z9049 Acquired absence of other specified parts of digestive tract: Secondary | ICD-10-CM | POA: Diagnosis not present

## 2021-03-08 DIAGNOSIS — Z681 Body mass index (BMI) 19 or less, adult: Secondary | ICD-10-CM | POA: Diagnosis not present

## 2021-03-08 DIAGNOSIS — I083 Combined rheumatic disorders of mitral, aortic and tricuspid valves: Secondary | ICD-10-CM | POA: Diagnosis not present

## 2021-03-08 DIAGNOSIS — Z950 Presence of cardiac pacemaker: Secondary | ICD-10-CM | POA: Diagnosis not present

## 2021-03-08 DIAGNOSIS — I4510 Unspecified right bundle-branch block: Secondary | ICD-10-CM | POA: Diagnosis not present

## 2021-03-08 DIAGNOSIS — H53132 Sudden visual loss, left eye: Secondary | ICD-10-CM | POA: Diagnosis not present

## 2021-03-08 DIAGNOSIS — Z4821 Encounter for aftercare following heart transplant: Secondary | ICD-10-CM | POA: Diagnosis not present

## 2021-03-08 DIAGNOSIS — E1140 Type 2 diabetes mellitus with diabetic neuropathy, unspecified: Secondary | ICD-10-CM | POA: Diagnosis not present

## 2021-03-08 DIAGNOSIS — Z941 Heart transplant status: Secondary | ICD-10-CM | POA: Diagnosis not present

## 2021-03-08 DIAGNOSIS — J9811 Atelectasis: Secondary | ICD-10-CM | POA: Diagnosis not present

## 2021-03-08 DIAGNOSIS — I339 Acute and subacute endocarditis, unspecified: Secondary | ICD-10-CM | POA: Diagnosis not present

## 2021-03-08 DIAGNOSIS — E8770 Fluid overload, unspecified: Secondary | ICD-10-CM | POA: Diagnosis not present

## 2021-03-08 DIAGNOSIS — D696 Thrombocytopenia, unspecified: Secondary | ICD-10-CM | POA: Diagnosis not present

## 2021-03-08 DIAGNOSIS — I25811 Atherosclerosis of native coronary artery of transplanted heart without angina pectoris: Secondary | ICD-10-CM | POA: Diagnosis not present

## 2021-03-08 DIAGNOSIS — Z8616 Personal history of COVID-19: Secondary | ICD-10-CM | POA: Diagnosis not present

## 2021-03-08 DIAGNOSIS — I120 Hypertensive chronic kidney disease with stage 5 chronic kidney disease or end stage renal disease: Secondary | ICD-10-CM | POA: Diagnosis not present

## 2021-03-08 LAB — RENAL FUNCTION PANEL
Albumin: 2.3 g/dL — ABNORMAL LOW (ref 3.5–5.0)
Anion gap: 7 (ref 5–15)
BUN: 22 mg/dL — ABNORMAL HIGH (ref 6–20)
CO2: 29 mmol/L (ref 22–32)
Calcium: 9.1 mg/dL (ref 8.9–10.3)
Chloride: 100 mmol/L (ref 98–111)
Creatinine, Ser: 5.39 mg/dL — ABNORMAL HIGH (ref 0.61–1.24)
GFR, Estimated: 13 mL/min — ABNORMAL LOW (ref >60)
Glucose, Bld: 95 mg/dL (ref 70–99)
Phosphorus: 5.2 mg/dL — ABNORMAL HIGH (ref 2.5–4.6)
Potassium: 4.5 mmol/L (ref 3.5–5.1)
Sodium: 136 mmol/L (ref 135–145)

## 2021-03-08 LAB — CRYPTOCOCCAL ANTIGEN: Crypto Ag: NEGATIVE

## 2021-03-08 LAB — RPR: RPR Ser Ql: NONREACTIVE

## 2021-03-08 MED ORDER — VANCOMYCIN HCL 750 MG/150ML IV SOLN: 750.0000 mg | INTRAVENOUS | Status: AC

## 2021-03-08 MED ORDER — SODIUM CHLORIDE 0.9 % IV SOLN: 1.0000 g | INTRAVENOUS | Status: AC

## 2021-03-08 MED ORDER — CLOPIDOGREL BISULFATE 75 MG PO TABS: 75.0000 mg | ORAL_TABLET | Freq: Every day | ORAL | Status: AC

## 2021-03-08 NOTE — Discharge Summary (Signed)
Physician Discharge Summary  Antonio Powell OMV:[id number] DOB: Apr 06, 1976 DOA: 03/04/2021  PCP: Medicine, [facility]  Admit date: 03/04/2021 Discharge date: 03/08/2021 30 Day Unplanned Readmission Risk Score   Flowsheet Row ED to Hosp-Admission (Current) from 03/04/2021 in [place] [facility] [place] [facility]  30 Day Unplanned Readmission Risk Score (%) 27.51 Filed at 03/08/2021 1200     This score is the patient's risk of an unplanned readmission within 30 days of being discharged (0 -100%). The score is based on dignosis, age, lab data, medications, orders, and past utilization.   Low:  0-14.9   Medium: 15-21.9   High: 22-29.9   Extreme: 30 and above         Admitted From: Home Disposition: [facility]  Recommendations for Outpatient Follow-up:  1. Follow up with PCP in 1-2 weeks 2. Please obtain BMP/CBC in one week 3. Please follow up with your PCP on the following pending results: Unresulted Labs (From admission, onward)          Start     Ordered   03/07/21 1355  Hepatitis B e antibody  Once,   R       Question:  Specimen collection method  Answer:  Lab=Lab collect   03/07/21 1355   03/07/21 1112  Rheumatoid factor  Once,   R       Question:  Specimen collection method  Answer:  Lab=Lab collect   03/07/21 1112   03/07/21 1112  Antistreptolysin O titer  Once,   R       Question:  Specimen collection method  Answer:  Lab=Lab collect   03/07/21 1112   03/07/21 1102  Q Fever Antibodies, IgG  Once,   R       Question:  Specimen collection method  Answer:  Lab=Lab collect   03/07/21 1101   03/07/21 1102  Brucella Antibody IgG, EIA  Once,   R       Question:  Specimen collection method  Answer:  Lab=Lab collect   03/07/21 1101   03/07/21 1102  Brucella Antibody IgM, EIA  Once,   R       Question:  Specimen collection method  Answer:  Lab=Lab collect   03/07/21 1101   03/07/21 1101  Miscellaneous [facility] test (send-out)  Once,   R       Question:  Test  name / description:  Answer:  legionella pcr   03/07/21 1100   03/07/21 1101  Legionella Pneumophila Serogp 1 Ur Ag  Once,   R        03/07/21 1100   03/07/21 1100  Bartonella anitbody panel  Once,   R       Question:  Specimen collection method  Answer:  Lab=Lab collect   03/07/21 1100   03/07/21 1100  Miscellaneous [facility] test (send-out)  Once,   R       Question:  Test name / description:  Answer:  bartonella pcr   03/07/21 1100   03/07/21 1059  Fluorescent treponemal ab(fta)-IgG-bld  Once,   R       Question:  Specimen collection method  Answer:  Lab=Lab collect   03/07/21 1100   03/07/21 1059  QuantiFERON-TB Gold Plus  Once,   R       Question:  Specimen collection method  Answer:  Lab=Lab collect   03/07/21 1100   03/07/21 1032  Blastomyces Antigen  Once,   R        03/07/21  1032   03/07/21 1032  Fungitell, Serum  Once,   R       Question:  Specimen collection method  Answer:  Lab=Lab collect   03/07/21 1032   03/07/21 1032  Acid Fast Smear (AFB)  (AFB smear + Culture w reflexed sensitivities panel)  Once,   R       "And" Linked Group Details   03/07/21 1032   03/07/21 1032  Acid Fast Culture with reflexed sensitivities  (AFB smear + Culture w reflexed sensitivities panel)  Once,   R       "And" Linked Group Details   03/07/21 1032   03/07/21 1031  Aspergillus antibody by immunodiff  Once,   R       Question:  Specimen collection method  Answer:  Lab=Lab collect   03/07/21 1032   03/07/21 1031  Histoplasma antigen, urine  Once,   R        03/07/21 1032   03/07/21 0825  Culture, blood (routine x 2)  BLOOD CULTURE X 2,   R (with TIMED occurrences)      03/07/21 0824   Signed and Held  Renal function panel  Once,   R       Question:  Specimen collection method  Answer:  Lab=Lab collect   Signed and Held   Signed and Held  CBC  Once,   R       Question:  Specimen collection method  Answer:  Lab=Lab collect   Signed and Held   Signed and Held  Renal function panel  Once,   R        Question:  Specimen collection method  Answer:  Lab=Lab collect   Signed and Held   Signed and Held  CBC  Once,   R       Question:  Specimen collection method  Answer:  Lab=Lab collect   Signed and Held            Home Health: None Equipment/Devices: None  Discharge Condition: Stable CODE STATUS: Full code Diet recommendation: Cardiac  Subjective: Seen and examined this morning.  No new complaint.  Brief/Interim Summary: JermaineWebsteris a45 y.o.male,with history of HTN, HLD, Heart transplant at age 34 followed by TAVR at [facility], ESRD with HD on [place], and more presents to the ED with a chief complaint of loss of vision in the left eye.Patient reports that on 23 April he had sudden onset of bright light in his left eye followed by complete darkness.  This was painless.  No other associated complaint.  The vision never came back.He had to wait till he can get a ride so on 03/04/2021, he was able to go see a provider. He reports he saw his PCP who sent him to an ophthalmologist. The ophthalmologist gave him eyedrops and sent him to the ER.  No previous history of such.He was admitted for colitis about a week ago, but reports that his nausea vomiting has cleared up since then.   In the ED, he was hemodynamically stable. CT head shows no acute intracranial abnormality. Small chronic infarct right cerebellar hemisphere. CTA head and neck shows no large vessel occlusion, significant stenosis or aneurysm in the head or neck. Dr.Kirkpatrickwas consulted and advised admission for stroke work-up including A1c, lipids, echo, CTA head and neck (done above), and MRI brain. Patient does have a pacemaker and was transferred to [facility] as there was no MRI [employer].  Unfortunately,MRI could  not be done due to him having abandoned epicardial wires.  Seen by neurology.  They had nothing to offer however they switched from aspirin to Plavix.  Patient's vision  remains impaired on the left side.  Transthoracic echo indicates possibility of aortic root abscess and possible vegetation/endocarditis.  This could very well explain possible retinal artery thromboembolism causing occlusion.  Consulted cardiology.  Received a call from [place] of cardiology that Dr. Haroldine Laws has reviewed the case and has recommended to transfer the patient to [facility] since he had transplant there and he has cardiologist there.  Discussed with Dr. Davina Poke at [facility] and then discussed with Dr. Alric Ran cardiologist at [facility] who has accepted the patient in transfer.  Also called the transfer line.  No beds available for 2 days.  Finally we were called and informed that there is a bed available.  There has been no change in patient's condition in last 2 days.  Patient remains hemodynamically stable.  Patient remains with impaired vision in the left eye with no change as well.  Was seen by ID.  They recommend TEE which can be done at [facility].  Patient received his routine hemodialysis here.  Discharge Diagnoses:  Principal Problem:   Retinal artery occlusion Active Problems:   Heart transplant recipient [facility])   ESRD (end stage renal disease) ([place])   Gastroesophageal reflux disease with esophagitis without hemorrhage   Protein-calorie malnutrition, moderate (HCC)   Tobacco use disorder   Endocarditis   Abnormal echocardiogram    Discharge Instructions   Allergies as of 03/08/2021      Reactions   Nsaids    Cant take due to heart transplant   Tolmetin Other (See Comments)   Cant take due to heart transplant   Varenicline Other (See Comments)   Vivid dreams   Phenergan [promethazine Hcl] Anxiety      Medication List    STOP taking these medications   aspirin 81 MG chewable tablet   calcitRIOL 0.5 MCG capsule Commonly known as: ROCALTROL   iron polysaccharides 150 MG capsule Commonly known as: NIFEREX   Multivitamin Adult Tabs   multivitamin  Tabs tablet     TAKE these medications   acetaminophen 500 MG tablet Commonly known as: TYLENOL Take 500 mg by mouth every 4 (four) hours as needed for moderate pain.   allopurinol 100 MG tablet Commonly known as: ZYLOPRIM Take 100 mg by mouth daily.   amitriptyline 10 MG tablet Commonly known as: ELAVIL Take 10 mg by mouth at bedtime.   calcium acetate 667 MG capsule Commonly known as: PHOSLO Take 2,001 mg by mouth 3 (three) times daily with meals. 2001 mg with meals and 1334 with snacks   carvedilol 6.25 MG tablet Commonly known as: COREG Take 6.25 mg by mouth 2 (two) times daily.   ceFEPIme 1 g in sodium chloride 0.9 % 100 mL Inject 1 g into the vein daily.   clopidogrel 75 MG tablet Commonly known as: PLAVIX Take 1 tablet (75 mg total) by mouth daily. Start taking on: Mar 09, 2021   colestipol 1 g tablet Commonly known as: COLESTID Take 1 tablet by mouth daily.   cyclobenzaprine 10 MG tablet Commonly known as: FLEXERIL Take 10 mg by mouth 2 (two) times daily.   diltiazem 360 MG 24 hr capsule Commonly known as: CARDIZEM CD Take 360 mg by mouth in the morning and at bedtime.   famotidine 10 MG tablet Commonly known as: PEPCID Take  1 tablet (10 mg total) by mouth at bedtime.   folic acid 1 MG tablet Commonly known as: FOLVITE Take 1 mg by mouth daily.   gabapentin 100 MG capsule Commonly known as: NEURONTIN Take 100 mg by mouth 3 (three) times daily.   hydrALAZINE 50 MG tablet Commonly known as: APRESOLINE Take 50 mg by mouth in the morning and at bedtime.   hyoscyamine 0.125 MG Tbdp disintergrating tablet Commonly known as: ANASPAZ Place 0.125 mg under the tongue every 6 (six) hours as needed for cramping.   imiquimod 5 % cream Commonly known as: ALDARA Apply 1 application topically 3 (three) times a week.   linaclotide 145 MCG Caps capsule Commonly known as: LINZESS Take 1 capsule (145 mcg total) by mouth daily before breakfast. Hold for  diarrhea   loperamide 2 MG capsule Commonly known as: IMODIUM Take 2-4 mg by mouth every 6 (six) hours as needed.   magnesium oxide 400 MG tablet Commonly known as: MAG-OX Take 400 mg by mouth daily.   melatonin 3 MG Tabs tablet Take 1.5 tablets (4.5 mg total) by mouth at bedtime.   mycophenolate 250 MG capsule Commonly known as: CELLCEPT Take 500 mg by mouth 2 (two) times daily.   ondansetron 4 MG disintegrating tablet Commonly known as: ZOFRAN-ODT Take 4 mg by mouth every 8 (eight) hours as needed.   ondansetron 4 MG tablet Commonly known as: ZOFRAN Take 4 mg by mouth every 8 (eight) hours as needed for nausea or vomiting.   pantoprazole 40 MG tablet Commonly known as: PROTONIX Take 1 tablet (40 mg total) by mouth 2 (two) times daily.   pravastatin 40 MG tablet Commonly known as: PRAVACHOL Take 40 mg by mouth daily.   predniSONE 5 MG tablet Commonly known as: DELTASONE Take 5 mg by mouth daily with breakfast.   sirolimus 1 MG tablet Commonly known as: RAPAMUNE Take 1 mg by mouth daily.   sucralfate 1 GM/10ML suspension Commonly known as: CARAFATE Take 10 mLs (1 g total) by mouth 4 (four) times daily -  with meals and at bedtime for 14 days.   tacrolimus 1 MG capsule Commonly known as: PROGRAF Take 6 mg by mouth at bedtime. Take 5 mg every morning   tacrolimus 5 MG capsule Commonly known as: PROGRAF Take 5 mg by mouth daily. Take 5 mg every morning and 6 mg every evening   vancomycin 750 MG/150ML Soln Commonly known as: VANCOREADY Inject 150 mLs (750 mg total) into the vein every Tuesday, Thursday, and Saturday at 6 PM. Start taking on: Mar 10, 2021       Allergies  Allergen Reactions  . Nsaids     Cant take due to heart transplant  . Tolmetin Other (See Comments)    Cant take due to heart transplant  . Varenicline Other (See Comments)    Vivid dreams  . Phenergan [Promethazine Hcl] Anxiety    Consultations: Cardiology, nephrology and  ID   Procedures/Studies: CT Angio Head W or Wo Contrast  Result Date: 03/04/2021 CLINICAL DATA:  Left eye vision loss for 5 days. EXAM: CT ANGIOGRAPHY HEAD AND NECK TECHNIQUE: Multidetector CT imaging of the head and neck was performed using the standard protocol during bolus administration of intravenous contrast. Multiplanar CT image reconstructions and MIPs were obtained to evaluate the vascular anatomy. Carotid stenosis measurements (when applicable) are obtained utilizing NASCET criteria, using the distal internal carotid diameter as the denominator. CONTRAST:  75mL OMNIPAQUE IOHEXOL 350 MG/ML SOLN COMPARISON:  None. FINDINGS: CTA NECK FINDINGS Aortic arch: Normal variant aortic arch branching pattern with common origin of the brachiocephalic and left common carotid arteries. Right carotid system: Patent without evidence of stenosis, dissection, or significant atherosclerosis. Left carotid system: Patent without evidence of stenosis, dissection, or significant atherosclerosis. Vertebral arteries: Patent without evidence of stenosis, dissection, or significant atherosclerosis. Skeleton: Edentulous. Moderate multilevel disc space narrowing in the cervical spine. Other neck: No evidence of cervical lymphadenopathy or mass. Upper chest: Mild centrilobular emphysema in the lung apices with incompletely imaged asymmetric subpleural reticulation in the right upper lobe which may reflect scarring/mild fibrosis. Review of the MIP images confirms the above findings CTA HEAD FINDINGS Anterior circulation: The internal carotid arteries are widely patent from skull base to carotid termini. ACAs and MCAs are patent without evidence of a proximal branch occlusion or significant proximal stenosis. No aneurysm is identified. Posterior circulation: The intracranial vertebral arteries are widely patent to the basilar. Patent PICA and SCA origins are seen bilaterally. The basilar artery is widely patent. Posterior  communicating arteries are diminutive or absent. Both PCAs are patent without evidence of a significant proximal stenosis. No aneurysm is identified. Venous sinuses: Patent. Anatomic variants: None. Review of the MIP images confirms the above findings IMPRESSION: 1. No large vessel occlusion, significant stenosis, or aneurysm in the head and neck. 2. Emphysema (ICD10-J43.9). Electronically Signed   By: Logan Bores M.D.   On: 03/04/2021 17:39   DG Chest 2 View  Result Date: 02/20/2021 CLINICAL DATA:  Intermittent chest pain, heartburn, history of heart transplant EXAM: CHEST - 2 VIEW COMPARISON:  11/24/2020 FINDINGS: Frontal and lateral views of the chest demonstrate postsurgical changes from cardiac transplant. Stable pacemaker leads. Resolution of the airspace disease in the lung bases on prior study, with persistent bibasilar interstitial prominence which may reflect scarring. No effusion or pneumothorax. No acute bony abnormalities. IMPRESSION: 1. Bibasilar interstitial prominence consistent with scarring. No acute airspace disease. 2. Postsurgical changes from cardiac transplant. Electronically Signed   By: Randa Ngo M.D.   On: 02/20/2021 15:02   CT Head Wo Contrast  Result Date: 03/04/2021 CLINICAL DATA:  Neuro deficit, acute, stroke suspected. Additional history provided: Vision loss in left eye 5 days. EXAM: CT HEAD WITHOUT CONTRAST TECHNIQUE: Contiguous axial images were obtained from the base of the skull through the vertex without intravenous contrast. COMPARISON:  Report from head CT 08/06/2007 (images currently unavailable). FINDINGS: Brain: Cerebral volume is normal for age. A small chronic infarct is questioned within the right cerebellar hemisphere (for instance as seen on series 4, image 52). There is no acute intracranial hemorrhage. No demarcated cortical infarct. No extra-axial fluid collection. No evidence of intracranial mass. No midline shift. Partially empty sella turcica.  Vascular: No hyperdense vessel. Skull: Normal. Negative for fracture or focal lesion. Sinuses/Orbits:a Visualized orbits show no acute finding. No significant paranasal sinus disease at the imaged levels. IMPRESSION: No evidence of acute intracranial abnormality. A small chronic infarct is questioned within the right cerebellar hemisphere. Electronically Signed   By: Kellie Simmering DO   On: 03/04/2021 15:42   CT Angio Neck W and/or Wo Contrast  Result Date: 03/04/2021 CLINICAL DATA:  Left eye vision loss for 5 days. EXAM: CT ANGIOGRAPHY HEAD AND NECK TECHNIQUE: Multidetector CT imaging of the head and neck was performed using the standard protocol during bolus administration of intravenous contrast. Multiplanar CT image reconstructions and MIPs were obtained to evaluate the vascular anatomy. Carotid stenosis measurements (when applicable) are obtained  utilizing NASCET criteria, using the distal internal carotid diameter as the denominator. CONTRAST:  80mL OMNIPAQUE IOHEXOL 350 MG/ML SOLN COMPARISON:  None. FINDINGS: CTA NECK FINDINGS Aortic arch: Normal variant aortic arch branching pattern with common origin of the brachiocephalic and left common carotid arteries. Right carotid system: Patent without evidence of stenosis, dissection, or significant atherosclerosis. Left carotid system: Patent without evidence of stenosis, dissection, or significant atherosclerosis. Vertebral arteries: Patent without evidence of stenosis, dissection, or significant atherosclerosis. Skeleton: Edentulous. Moderate multilevel disc space narrowing in the cervical spine. Other neck: No evidence of cervical lymphadenopathy or mass. Upper chest: Mild centrilobular emphysema in the lung apices with incompletely imaged asymmetric subpleural reticulation in the right upper lobe which may reflect scarring/mild fibrosis. Review of the MIP images confirms the above findings CTA HEAD FINDINGS Anterior circulation: The internal carotid arteries  are widely patent from skull base to carotid termini. ACAs and MCAs are patent without evidence of a proximal branch occlusion or significant proximal stenosis. No aneurysm is identified. Posterior circulation: The intracranial vertebral arteries are widely patent to the basilar. Patent PICA and SCA origins are seen bilaterally. The basilar artery is widely patent. Posterior communicating arteries are diminutive or absent. Both PCAs are patent without evidence of a significant proximal stenosis. No aneurysm is identified. Venous sinuses: Patent. Anatomic variants: None. Review of the MIP images confirms the above findings IMPRESSION: 1. No large vessel occlusion, significant stenosis, or aneurysm in the head and neck. 2. Emphysema (ICD10-J43.9). Electronically Signed   By: Logan Bores M.D.   On: 03/04/2021 17:39   DG Chest Bilateral Decubitus  Result Date: 03/07/2021 CLINICAL DATA:  Endocarditis, history of heart transplant EXAM: CHEST - BILATERAL DECUBITUS VIEW COMPARISON:  02/20/2021 FINDINGS: Bilateral decubitus views of the chest are obtained. Postsurgical changes are seen from cardiac transplant. Cardiac silhouette is stable. There are chronic areas of scarring seen at the lung bases. Dependent hypoventilatory changes are seen on the decubitus images. No acute airspace disease, effusion, or pneumothorax. No acute bony abnormalities. IMPRESSION: 1. Stable bibasilar parenchymal lung scarring. 2. Dependent hypoventilatory changes on decubitus views. No acute airspace disease. 3. Stable postsurgical changes from cardiac transplant. Electronically Signed   By: Randa Ngo M.D.   On: 03/07/2021 20:15   CT ABDOMEN PELVIS W CONTRAST  Result Date: 02/20/2021 CLINICAL DATA:  Epigastric pain and burning for 2 weeks, vomiting EXAM: CT ABDOMEN AND PELVIS WITH CONTRAST TECHNIQUE: Multidetector CT imaging of the abdomen and pelvis was performed using the standard protocol following bolus administration of  intravenous contrast. CONTRAST:  116mL OMNIPAQUE IOHEXOL 300 MG/ML  SOLN COMPARISON:  02/23/2020 FINDINGS: Lower chest: Bibasilar emphysema and scarring. Postsurgical changes from previous cardiac transplant. Hepatobiliary: No focal liver abnormality is seen. Status post cholecystectomy. No biliary dilatation. Pancreas: Unremarkable. No pancreatic ductal dilatation or surrounding inflammatory changes. Spleen: Normal in size without focal abnormality. Adrenals/Urinary Tract: Marked bilateral renal cortical atrophy consistent with history of end-stage renal disease. The adrenals are unremarkable. Bladder is decompressed, limiting its evaluation. Stomach/Bowel: No bowel obstruction or ileus. There is segmental wall thickening of the rectosigmoid colon compatible with inflammatory or infectious colitis. Mural thickening of the gastric antrum/pylorus, nonspecific. Mild gastric distension. Vascular/Lymphatic: Aortic atherosclerosis. No enlarged abdominal or pelvic lymph nodes. Reproductive: Prostate is unremarkable. Other: No free fluid or free gas.  No abdominal wall hernia. Musculoskeletal: No acute or destructive bony lesions. Diffuse changes of renal osteodystrophy. Reconstructed images demonstrate no additional findings. IMPRESSION: 1. Segmental wall thickening of the distal rectosigmoid  colon, consistent with inflammatory or infectious colitis. 2. Mural thickening of the gastric antrum and pylorus, which could reflect gastritis or peptic ulcer disease. 3. Postsurgical changes from cardiac transplant. 4. Sequela of end-stage renal disease. 5.  Aortic Atherosclerosis (ICD10-I70.0). Electronically Signed   By: Randa Ngo M.D.   On: 02/20/2021 17:10   ECHOCARDIOGRAM COMPLETE  Result Date: 03/05/2021    ECHOCARDIOGRAM REPORT   Patient Name:   Antonio Powell Date of Exam: 03/05/2021 Medical Rec #:  [id number]          Height:       71.0 in Accession #:    [id number]         Weight:       135.0 lb Date of Birth:   Oct 28, 1976           BSA:          1.784 m Patient Age:    59 years           BP:           117/94 mmHg Patient Gender: M                  HR:           105 bpm. Exam Location:  Inpatient Procedure: 2D Echo, Cardiac Doppler and Color Doppler Indications:     Stroke  History:         Patient has no prior history of Echocardiogram examinations.                  Heart transplant, Pacemaker and Prior Cardiac Surgery; Risk                  Factors:Hypertension and Current Smoker. Heart Transplant                  12/25/1999                  Freestyle Aortic Valve/Root 27 mm 01/2019.                  Aortic Valve: 27 mm Medtronic homograft valve is present in the                  aortic position. Procedure Date: 01/2019.  Sonographer:     Cammy Brochure Referring Phys:  [id number] [place] Diagnosing Phys: Eleonore Chiquito MD IMPRESSIONS  1. 27 mm Freestyle AoV/Root replacement 01/2019. 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 There is a also a small mobile mass under the NCC/LCC best seen in the PLAX that measures 0.7 cm x 0.7 cm. 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 The echo report from [facility] was reviewed via [facility] (12/08/2020), where they metion possible prosthetic valve dehisensce, but this is not a stented valve. Suspect, this is related to root abscess based on appearance. TEE was not performed at [facility]. Would recommend TEE or gated cardiac CT for further clarification. Would also recommend blood cultures. The aortic valve has been repaired/replaced. Aortic valve regurgitation is not visualized. There is a 27 mm Medtronic homograft valve present in the aortic position. Procedure Date: 01/2019.  2. Left ventricular ejection fraction, by estimation, is 50 to 55%. The left ventricle has low normal function. The left ventricle  has no regional wall motion abnormalities. There is moderate concentric  left ventricular hypertrophy. Left ventricular diastolic function could not be evaluated.  3. Right ventricular systolic function is mildly reduced. The right ventricular size is normal. There is moderately elevated pulmonary artery systolic pressure. The estimated right ventricular systolic pressure is 66.0 mmHg.  4. Biatrial enlargement is present likely due to prior heart transplant in 2001 that was likely performed usring a biatrial technique. Left atrial size was severely dilated.  5. Right atrial size was severely dilated.  6. The mitral valve is grossly normal. Moderate mitral valve regurgitation.  7. The inferior vena cava is dilated in size with >50% respiratory variability, suggesting right atrial pressure of 8 mmHg.  8. Prior aortic root/AoV replacement 27 mm fresstyle. Concerns for aortic root abscess detailed in the report. Aortic root/ascending aorta has been repaired/replaced. FINDINGS  Left Ventricle: Left ventricular ejection fraction, by estimation, is 50 to 55%. The left ventricle has low normal function. The left ventricle has no regional wall motion abnormalities. The left ventricular internal cavity size was normal in size. There is moderate concentric left ventricular hypertrophy. Abnormal (paradoxical) septal motion consistent with post-operative status. Left ventricular diastolic function could not be evaluated due to abnormal septal motion. Left ventricular diastolic function could not be evaluated. Right Ventricle: The right ventricular size is normal. No increase in right ventricular wall thickness. Right ventricular systolic function is mildly reduced. There is moderately elevated pulmonary artery systolic pressure. The tricuspid regurgitant velocity is 3.25 m/s, and with an assumed right atrial pressure of 8 mmHg, the estimated right ventricular systolic pressure is 63.0 mmHg. Left Atrium: Biatrial enlargement is  present likely due to prior heart transplant in 2001 that was likely performed usring a biatrial technique. Left atrial size was severely dilated. Right Atrium: Right atrial size was severely dilated. Pericardium: Trivial pericardial effusion is present. Mitral Valve: The mitral valve is grossly normal. There is mild thickening of the mitral valve leaflet(s). Moderate mitral valve regurgitation. MV peak gradient, 19.5 mmHg. The mean mitral valve gradient is 7.0 mmHg. Tricuspid Valve: The tricuspid valve is grossly normal. Tricuspid valve regurgitation is mild . No evidence of tricuspid stenosis. Aortic Valve: 27 mm Freestyle AoV/Root replacement 01/2019. 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 There is a also a small mobile mass under the NCC/LCC best seen in the PLAX that measures 0.7 cm x 0.7 cm. 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 The echo report from [facility] was reviewed via [place] (12/08/2020), where they metion possible prosthetic valve dehisensce, but this is not a stented valve. Suspect, this is related to root abscess based on appearance. TEE was not performed at [facility]. Would recommend TEE or gated cardiac CT for further clarification. Would also recommend blood cultures. The aortic valve has been repaired/replaced. Aortic valve regurgitation is not visualized. Aortic valve mean gradient measures 12.0 mmHg. Aortic valve peak gradient measures 18.1 mmHg. Aortic valve area, by VTI measures 1.32 cm. There is a 27 mm Medtronic homograft valve present in the aortic position. Procedure Date: 01/2019. Pulmonic Valve: The pulmonic valve was grossly normal. Pulmonic valve regurgitation is mild. No evidence of pulmonic stenosis. Aorta: Prior aortic root/AoV replacement 27 mm fresstyle. Concerns for aortic root abscess detailed  in the report. The aortic root/ascending aorta has been repaired/replaced. Venous: The inferior vena cava is dilated in size with greater than 50% respiratory variability, suggesting right atrial pressure  of 8 mmHg. IAS/Shunts: The atrial septum is grossly normal.  LEFT VENTRICLE PLAX 2D LVIDd:         4.20 cm LVIDs:         3.00 cm LV PW:         1.20 cm LV IVS:        0.93 cm LVOT diam:     2.40 cm LV SV:         56 LV SV Index:   31 LVOT Area:     4.52 cm  LV Volumes (MOD) LV vol d, MOD A2C: 100.0 ml LV vol d, MOD A4C: 104.0 ml LV vol s, MOD A2C: 51.2 ml LV vol s, MOD A4C: 53.3 ml LV SV MOD A2C:     48.8 ml LV SV MOD A4C:     104.0 ml LV SV MOD BP:      53.7 ml RIGHT VENTRICLE            IVC RV S prime:     8.70 cm/s  IVC diam: 3.10 cm LEFT ATRIUM              Index        RIGHT ATRIUM           Index LA Vol (A2C):   245.0 ml 137.32 ml/m RA Area:     18.80 cm LA Vol (A4C):   206.0 ml 115.46 ml/m RA Volume:   49.50 ml  27.74 ml/m LA Biplane Vol: 224.0 ml 125.55 ml/m  AORTIC VALVE AV Area (Vmax):    1.27 cm AV Area (Vmean):   1.14 cm AV Area (VTI):     1.32 cm AV Vmax:           213.00 cm/s AV Vmean:          163.000 cm/s AV VTI:            0.423 m AV Peak Grad:      18.1 mmHg AV Mean Grad:      12.0 mmHg LVOT Vmax:         59.60 cm/s LVOT Vmean:        41.100 cm/s LVOT VTI:          0.123 m LVOT/AV VTI ratio: 0.29  AORTA Ao Asc diam: 3.30 cm MITRAL VALVE                TRICUSPID VALVE MV Area (PHT): 3.99 cm     TR Peak grad:   42.2 mmHg MV Area VTI:   1.61 cm     TR Vmax:        325.00 cm/s MV Peak grad:  19.5 mmHg MV Mean grad:  7.0 mmHg     SHUNTS MV Vmax:       2.21 m/s     Systemic VTI:  0.12 m MV Vmean:      117.0 cm/s   Systemic Diam: 2.40 cm MV Decel Time: 190 msec MV E velocity: 192.00 cm/s Eleonore Chiquito MD Electronically signed by Eleonore Chiquito MD Signature Date/Time: 03/05/2021/3:00:03 PM    Final (Updated)       Discharge Exam: Vitals:   03/08/21 0743 03/08/21 1207  BP: 100/67 100/78   Pulse: 85 85  Resp: 16 18  Temp: 98.1 F (36.7 C) 98.6 F (37 C)  SpO2: 93% 100%   Vitals:   03/07/21 1722 03/07/21 2129 03/08/21 0743 03/08/21 1207  BP: 101/69 93/63 100/67 100/78  Pulse:  84 85  85  Resp: 18 18 16 18   Temp: 98.6 F (37 C) 98.4 F (36.9 C) 98.1 F (36.7 C) 98.6 F (37 C)  TempSrc: Oral Oral Oral Oral  SpO2: 94% 93% 93% 100%  Weight: 62.4 kg     Height:        General: Pt is alert, awake, not in acute distress Cardiovascular: RRR, S1/S2 +, no rubs, no gallops Respiratory: CTA bilaterally, no wheezing, no rhonchi Abdominal: Soft, NT, ND, bowel sounds + Extremities: no edema, no cyanosis    The results of significant diagnostics from this hospitalization (including imaging, microbiology, ancillary and laboratory) are listed below for reference.     Microbiology: Recent Results (from the past 240 hour(s))  Resp Panel by RT-PCR (Flu A&B, Covid) Nasopharyngeal Swab     Status: None   Collection Time: 03/04/21  4:02 PM   Specimen: Nasopharyngeal Swab; Nasopharyngeal(NP) swabs in vial transport medium  Result Value Ref Range Status   SARS Coronavirus 2 by RT PCR NEGATIVE NEGATIVE Final    Comment: (NOTE) SARS-CoV-2 target nucleic acids are NOT DETECTED.  The SARS-CoV-2 RNA is generally detectable in upper respiratory specimens during the acute phase of infection. The lowest concentration of SARS-CoV-2 viral copies this assay can detect is 138 copies/mL. A negative result does not preclude SARS-Cov-2 infection and should not be used as the sole basis for treatment or other patient management decisions. A negative result may occur with  improper specimen collection/handling, submission of specimen other than nasopharyngeal swab, presence of viral mutation(s) within the areas targeted by this assay, and inadequate number of viral copies(<138 copies/mL). A negative result must be combined with clinical observations, patient history, and  epidemiological information. The expected result is Negative.  Fact Sheet for Patients:  EntrepreneurPulse.com.au  Fact Sheet for Healthcare Providers:  IncredibleEmployment.be  This test is no t yet approved or cleared by the [place] FDA and  has been authorized for detection and/or diagnosis of SARS-CoV-2 by FDA under an Emergency Use Authorization (EUA). This EUA will remain  in effect (meaning this test can be used) for the duration of the COVID-19 declaration under Section 564(b)(1) of the Act, 21 U.S.C.section 360bbb-3(b)(1), unless the authorization is terminated  or revoked sooner.       Influenza A by PCR NEGATIVE NEGATIVE Final   Influenza B by PCR NEGATIVE NEGATIVE Final    Comment: (NOTE) The Xpert Xpress SARS-CoV-2/FLU/RSV plus assay is intended as an aid in the diagnosis of influenza from Nasopharyngeal swab specimens and should not be used as a sole basis for treatment. Nasal washings and aspirates are unacceptable for Xpert Xpress SARS-CoV-2/FLU/RSV testing.  Fact Sheet for Patients: EntrepreneurPulse.com.au  Fact Sheet for Healthcare Providers: IncredibleEmployment.be  This test is not yet approved or cleared by the [place] FDA and has been authorized for detection and/or diagnosis of SARS-CoV-2 by FDA under an Emergency Use Authorization (EUA). This EUA will remain in effect (meaning this test can be used) for the duration of the COVID-19 declaration under Section 564(b)(1) of the Act, 21 U.S.C. section 360bbb-3(b)(1), unless the authorization is terminated or revoked.  Performed at [facility], [address] [place], [place] [address]   Culture, blood (routine x 2)     Status: None (Preliminary result)   Collection Time: 03/05/21  5:36 PM   Specimen: BLOOD LEFT HAND  Result Value Ref Range Status   Specimen Description BLOOD LEFT HAND  Final   Special Requests    Final  BOTTLES DRAWN AEROBIC ONLY Blood Culture results may not be optimal due to an inadequate volume of blood received in culture bottles   Culture   Final    NO GROWTH 2 DAYS Performed at [facility], [place] [address], [place], [place] [address]    Report Status PENDING  Incomplete  Culture, blood (routine x 2)     Status: None (Preliminary result)   Collection Time: 03/05/21  5:45 PM   Specimen: BLOOD LEFT HAND  Result Value Ref Range Status   Specimen Description BLOOD LEFT HAND  Final   Special Requests   Final    BOTTLES DRAWN AEROBIC ONLY Blood Culture results may not be optimal due to an inadequate volume of blood received in culture bottles   Culture   Final    NO GROWTH 2 DAYS Performed at [facility], [place] [address], [place], [place] [address]    Report Status PENDING  Incomplete     Labs: BNP (last 3 results) No results for input(s): BNP in the last 8760 hours. Basic Metabolic Panel: Recent Labs  Lab 03/04/21 1623 03/05/21 0314 03/07/21 1639 03/08/21 0926  NA 134* 133* 134* 136  K 4.3 4.6 5.8* 4.5  CL 95* 93* 94* 100  CO2 25 25 25 29   GLUCOSE 97 96 84 95  BUN 35* 37* 43* 22*  CREATININE 9.59* 10.69* 9.30* 5.39*  CALCIUM 9.6 9.2 9.5 9.1  PHOS  --   --  8.3* 5.2*   Liver Function Tests: Recent Labs  Lab 03/04/21 1623 03/05/21 0314 03/07/21 1639 03/08/21 0926  AST 13* 11*  --   --   ALT 17 14  --   --   ALKPHOS 59 49  --   --   BILITOT 0.4 0.4  --   --   PROT 7.3 5.9*  --   --   ALBUMIN 2.8* 2.3* 2.4* 2.3*   No results for input(s): LIPASE, AMYLASE in the last 168 hours. No results for input(s): AMMONIA in the last 168 hours. CBC: Recent Labs  Lab 03/04/21 1623 03/05/21 0314 03/07/21 0132  WBC 9.3 8.7 8.9  NEUTROABS 6.3  --  6.1  HGB 8.8* 8.3* 8.0*  HCT 25.5* 23.2* 22.7*  MCV 87.0 83.2 83.8  PLT 156 132* 104*   Cardiac Enzymes: No results for input(s): CKTOTAL, CKMB, CKMBINDEX, TROPONINI in the last 168  hours. BNP: Invalid input(s): POCBNP CBG: No results for input(s): GLUCAP in the last 168 hours. D-Dimer No results for input(s): DDIMER in the last 72 hours. Hgb A1c No results for input(s): HGBA1C in the last 72 hours. Lipid Profile No results for input(s): CHOL, HDL, LDLCALC, TRIG, CHOLHDL, LDLDIRECT in the last 72 hours. Thyroid function studies No results for input(s): TSH, T4TOTAL, T3FREE, THYROIDAB in the last 72 hours.  Invalid input(s): FREET3 Anemia work up No results for input(s): VITAMINB12, FOLATE, FERRITIN, TIBC, IRON, RETICCTPCT in the last 72 hours. Urinalysis    Component Value Date/Time   COLORURINE YELLOW 01/02/2008 1216   APPEARANCEUR CLEAR 01/02/2008 1216   LABSPEC 1.020 01/02/2008 1216   PHURINE 5.0 01/02/2008 1216   GLUCOSEU NEGATIVE 01/02/2008 1216   HGBUR LARGE (A) 01/02/2008 1216   BILIRUBINUR NEGATIVE 01/02/2008 1216   KETONESUR NEGATIVE 01/02/2008 1216   PROTEINUR NEGATIVE 01/02/2008 1216   UROBILINOGEN 0.2 01/02/2008 1216   NITRITE NEGATIVE 01/02/2008 1216   LEUKOCYTESUR SMALL (A) 01/02/2008 1216   Sepsis Labs Invalid input(s): PROCALCITONIN,  WBC,  LACTICIDVEN Microbiology  Recent Results (from the past 240 hour(s))  Resp Panel by RT-PCR (Flu A&B, Covid) Nasopharyngeal Swab     Status: None   Collection Time: 03/04/21  4:02 PM   Specimen: Nasopharyngeal Swab; Nasopharyngeal(NP) swabs in vial transport medium  Result Value Ref Range Status   SARS Coronavirus 2 by RT PCR NEGATIVE NEGATIVE Final    Comment: (NOTE) SARS-CoV-2 target nucleic acids are NOT DETECTED.  The SARS-CoV-2 RNA is generally detectable in upper respiratory specimens during the acute phase of infection. The lowest concentration of SARS-CoV-2 viral copies this assay can detect is 138 copies/mL. A negative result does not preclude SARS-Cov-2 infection and should not be used as the sole basis for treatment or other patient management decisions. A negative result may occur  with  improper specimen collection/handling, submission of specimen other than nasopharyngeal swab, presence of viral mutation(s) within the areas targeted by this assay, and inadequate number of viral copies(<138 copies/mL). A negative result must be combined with clinical observations, patient history, and epidemiological information. The expected result is Negative.  Fact Sheet for Patients:  EntrepreneurPulse.com.au  Fact Sheet for Healthcare Providers:  IncredibleEmployment.be  This test is no t yet approved or cleared by the [place] FDA and  has been authorized for detection and/or diagnosis of SARS-CoV-2 by FDA under an Emergency Use Authorization (EUA). This EUA will remain  in effect (meaning this test can be used) for the duration of the COVID-19 declaration under Section 564(b)(1) of the Act, 21 U.S.C.section 360bbb-3(b)(1), unless the authorization is terminated  or revoked sooner.       Influenza A by PCR NEGATIVE NEGATIVE Final   Influenza B by PCR NEGATIVE NEGATIVE Final    Comment: (NOTE) The Xpert Xpress SARS-CoV-2/FLU/RSV plus assay is intended as an aid in the diagnosis of influenza from Nasopharyngeal swab specimens and should not be used as a sole basis for treatment. Nasal washings and aspirates are unacceptable for Xpert Xpress SARS-CoV-2/FLU/RSV testing.  Fact Sheet for Patients: EntrepreneurPulse.com.au  Fact Sheet for Healthcare Providers: IncredibleEmployment.be  This test is not yet approved or cleared by the [place] FDA and has been authorized for detection and/or diagnosis of SARS-CoV-2 by FDA under an Emergency Use Authorization (EUA). This EUA will remain in effect (meaning this test can be used) for the duration of the COVID-19 declaration under Section 564(b)(1) of the Act, 21 U.S.C. section 360bbb-3(b)(1), unless the authorization is terminated  or revoked.  Performed at [facility] [address], [place], [place] [address]   Culture, blood (routine x 2)     Status: None (Preliminary result)   Collection Time: 03/05/21  5:36 PM   Specimen: BLOOD LEFT HAND  Result Value Ref Range Status   Specimen Description BLOOD LEFT HAND  Final   Special Requests   Final    BOTTLES DRAWN AEROBIC ONLY Blood Culture results may not be optimal due to an inadequate volume of blood received in culture bottles   Culture   Final    NO GROWTH 2 DAYS Performed at [facility], [place] [address] [place], [place] [address]    Report Status PENDING  Incomplete  Culture, blood (routine x 2)     Status: None (Preliminary result)   Collection Time: 03/05/21  5:45 PM   Specimen: BLOOD LEFT HAND  Result Value Ref Range Status   Specimen Description BLOOD LEFT HAND  Final   Special Requests   Final    BOTTLES DRAWN AEROBIC ONLY Blood Culture  results may not be optimal due to an inadequate volume of blood received in culture bottles   Culture   Final    NO GROWTH 2 DAYS Performed at [facility], [place]  [address], [place], [place] [address]    Report Status PENDING  Incomplete     Time coordinating discharge: Over 30 minutes  SIGNED:   Darliss Cheney, MD  [facility] 03/08/2021, 1:00 PM  If 7PM-7AM, please contact night-coverage [email or web address]

## 2021-03-08 NOTE — Progress Notes (Signed)
Occupational Therapy Treatment and Discharge Patient Details Name: Antonio Powell MRN: [id number] DOB: 1976-05-12 Today's Date: 03/08/2021    History of present illness Pt is a 45 y.o. male admitted 03/04/21 with L eye vision loss; pt reports sudden onset of bright eye on 02/28/21 then vision going completely dark, but today was first day he could get a ride for medical help. Head CT negative for acute injury; chronic R cerebellar infarct. Awaiting MRI. PMH includes HTN, ESRD (HD TTS), heart transplant (age 41), pacemaker.   OT comments  Pt is able to complete all ADLs at mod I - independent level.  Reviewed sequelae of vision loss and effects on function including possible loss of depth perception and scanning strategies.   He demonstrates a good understanding and good safety awareness.  He does not drive, and is very minimally active in the community.  All OT goals achieved, OT will sign off.  Pt agrees with above.   Follow Up Recommendations  No OT follow up    Equipment Recommendations  None recommended by OT    Recommendations for Other Services      Precautions / Restrictions Precautions Precautions: Other (comment) Precaution Comments: L eye vision loss pt appears to have small amount of tunnel vision (reports chronic blurred vision "from astigmatism" prior to vision loss)       Mobility Bed Mobility Overal bed mobility: Independent                  Transfers Overall transfer level: Independent                    Balance Overall balance assessment: No apparent balance deficits (not formally assessed)                                         ADL either performed or assessed with clinical judgement   ADL Overall ADL's : Modified independent                                       General ADL Comments: Pt is independent in his room     Vision   Additional Comments: Pt reports vision has not changed, and that he has  central vision preserved (but decreased acuity at baseline).  He is able to identify where his periphery vision ends.  He denies having depth perception issues.  Reviewed with him that his depth perception may be impaired and to be cautious on uneven surfaces, stairs, curbs, bathtub.  He was able to negotiate 6 stairs without difficulty and denies that his depth perception felt impaired.  We also discussed visual scanning strategies and to be extra vigilant with looking far to the left when ambulating outside and in the community (pt limited with community activities at baseline and does not drive).  He verbalized and demonstrated independence with all info provided   Perception     Praxis      Cognition Arousal/Alertness: Awake/alert Behavior During Therapy: WFL for tasks assessed/performed Overall Cognitive Status: Within Functional Limits for tasks assessed                                          Exercises  Shoulder Instructions       General Comments      Pertinent Vitals/ Pain       Pain Assessment: 0-10 Pain Score: 4  Pain Location: Rt lateral chest Pain Descriptors / Indicators: Discomfort Pain Intervention(s): Monitored during session  Home Living                                          Prior Functioning/Environment              Frequency           Progress Toward Goals  OT Goals(current goals can now be found in the care plan section)  Progress towards OT goals: Goals met/education completed, patient discharged from [place] All goals met and education completed, patient discharged from OT services    Co-evaluation                 AM-PAC OT "6 Clicks" Daily Activity     Outcome Measure   Help from another person eating meals?: None Help from another person taking care of personal grooming?: None Help from another person toileting, which includes using toliet, bedpan, or urinal?: None Help from  another person bathing (including washing, rinsing, drying)?: None Help from another person to put on and taking off regular upper body clothing?: None Help from another person to put on and taking off regular lower body clothing?: None 6 Click Score: 24    End of Session    OT Visit Diagnosis: Other abnormalities of gait and mobility (R26.89);Other (comment)   Activity Tolerance Patient tolerated treatment well   Patient Left in bed;with call bell/phone within reach   Nurse Communication Mobility status        Time: 0801-0820 OT Time Calculation (min): 19 min  Charges: OT General Charges $OT Visit: 1 Visit OT Treatments $Self Care/Home Management : 8-22 mins  Nilsa Nutting OTR/L Acute Rehabilitation Services Pager [phone] Office [phone]    Lucille Passy M 03/08/2021, 8:52 AM

## 2021-03-08 NOTE — Plan of Care (Signed)
  Problem: Education: Goal: Knowledge of General Education information will improve Description: Including pain rating scale, medication(s)/side effects and non-pharmacologic comfort measures Outcome: Adequate for Discharge   Problem: Health Behavior/Discharge Planning: Goal: Ability to manage health-related needs will improve Outcome: Adequate for Discharge   Problem: Clinical Measurements: Goal: Ability to maintain clinical measurements within normal limits will improve Outcome: Adequate for Discharge Goal: Will remain free from infection Outcome: Adequate for Discharge Goal: Diagnostic test results will improve Outcome: Adequate for Discharge Goal: Respiratory complications will improve Outcome: Adequate for Discharge Goal: Cardiovascular complication will be avoided Outcome: Adequate for Discharge   Problem: Activity: Goal: Risk for activity intolerance will decrease Outcome: Adequate for Discharge   Problem: Nutrition: Goal: Adequate nutrition will be maintained Outcome: Adequate for Discharge   Problem: Coping: Goal: Level of anxiety will decrease Outcome: Adequate for Discharge   Problem: Elimination: Goal: Will not experience complications related to bowel motility Outcome: Adequate for Discharge Goal: Will not experience complications related to urinary retention Outcome: Adequate for Discharge   Problem: Pain Managment: Goal: General experience of comfort will improve Outcome: Adequate for Discharge   Problem: Safety: Goal: Ability to remain free from injury will improve Outcome: Adequate for Discharge   Problem: Skin Integrity: Goal: Risk for impaired skin integrity will decrease Outcome: Adequate for Discharge   Problem: Education: Goal: Knowledge of disease or condition will improve Outcome: Adequate for Discharge Goal: Knowledge of patient specific risk factors addressed and post discharge goals established will improve Outcome: Adequate for  Discharge

## 2021-03-08 NOTE — Progress Notes (Signed)
[place] for Infectious Disease  Date of Admission:  03/04/2021     CC: tte abnormality of AV prosthesis   Lines:  Old left forearm avf  Abx: 4/30-c vanc 4/30-c cefepime      ASSESSMENT: TTE abnormality av prosthesis Acute painless left eye visual loss S/p heart transplant 2001 S/p ppm 2020 Hx avr Immunosuppression tacro/pred/sirolimus  Patient bcx continues to be negative Review hx again no symptomatology to suggest SBE. The tte/note from [facility] health 01/2001 mention abnormality of the AV prosthesis as well that is not yet followed up.   I suggest tee to him but report he is transfering to [facility] today    PLAN: 1. Continue empiric vanc/cefepime for now 2. Several CNBE labs pending  3. TEE is needed; can be done at [facility] 4. Lots of ambiguity in dx   Principal Problem:   Retinal artery occlusion Active Problems:   Heart transplant recipient [facility])   ESRD (end stage renal disease) ([place])   Gastroesophageal reflux disease with esophagitis without hemorrhage   Protein-calorie malnutrition, moderate (HCC)   Tobacco use disorder   Endocarditis   Allergies  Allergen Reactions  . Nsaids     Cant take due to heart transplant  . Tolmetin Other (See Comments)    Cant take due to heart transplant  . Varenicline Other (See Comments)    Vivid dreams  . Phenergan [Promethazine Hcl] Anxiety    Scheduled Meds: . amitriptyline  10 mg Oral QHS  . calcium acetate  2,001 mg Oral TID WC  . carvedilol  6.25 mg Oral BID WC  . Chlorhexidine Gluconate Cloth  6 each Topical Q0600  . Chlorhexidine Gluconate Cloth  6 each Topical Q0600  . clopidogrel  75 mg Oral Daily  . colestipol  1 g Oral Daily  . diltiazem  360 mg Oral Daily  . famotidine  10 mg Oral QHS  . gabapentin  100 mg Oral TID  . melatonin  4.5 mg Oral QHS  . multivitamin  1 tablet Oral Daily  . mycophenolate  500 mg Oral BID  . nicotine  14 mg Transdermal Daily  . pantoprazole  40 mg Oral BID   . pravastatin  40 mg Oral q1800  . predniSONE  5 mg Oral Q breakfast  . sirolimus  1 mg Oral Daily  . tacrolimus  5 mg Oral Daily  . tacrolimus  6 mg Oral QHS   Continuous Infusions: . ceFEPime (MAXIPIME) IV 1 g (03/07/21 2034)  . [START ON 03/10/2021] vancomycin     PRN Meds:.acetaminophen **OR** acetaminophen (TYLENOL) oral liquid 160 mg/5 mL **OR** acetaminophen, calcium acetate, cyclobenzaprine, loperamide, ondansetron (ZOFRAN) IV, oxyCODONE   SUBJECTIVE: Persistent left eye visual deficit No other complain. Chronic neuropathy legs No f/c/n/v  transfering [facility] today  bcx remains negative  Review of Systems: ROS All other ROS was negative, except mentioned above     OBJECTIVE: Vitals:   03/07/21 1722 03/07/21 2129 03/08/21 0743 03/08/21 1207  BP: 101/69 93/63 100/67 100/78  Pulse:  84 85 85  Resp: 18 18 16 18   Temp: 98.6 F (37 C) 98.4 F (36.9 C) 98.1 F (36.7 C) 98.6 F (37 C)  TempSrc: Oral Oral Oral Oral  SpO2: 94% 93% 93% 100%  Weight: 62.4 kg     Height:       Body mass index is 19.19 kg/m.  Physical Exam General/constitutional: no distress, pleasant HEENT: Normocephalic, PER, Conj  Clear, EOMI, Oropharynx clear Neck supple CV: rrr no mrg Lungs: clear to auscultation, normal respiratory effort Abd: Soft, Nontender Ext: no edema Skin: No Rash; facial flesh like papules chronic stable Neuro: nonfocal MSK: no peripheral joint swelling/tenderness/warmth; back spines nontender     Lab Results Lab Results  Component Value Date   WBC 8.9 03/07/2021   HGB 8.0 (L) 03/07/2021   HCT 22.7 (L) 03/07/2021   MCV 83.8 03/07/2021   PLT 104 (L) 03/07/2021    Lab Results  Component Value Date   CREATININE 5.39 (H) 03/08/2021   BUN 22 (H) 03/08/2021   NA 136 03/08/2021   K 4.5 03/08/2021   CL 100 03/08/2021   CO2 29 03/08/2021    Lab Results  Component Value Date   ALT 14 03/05/2021   AST 11 (L) 03/05/2021   ALKPHOS 49 03/05/2021   BILITOT  0.4 03/05/2021      Microbiology: Recent Results (from the past 240 hour(s))  Resp Panel by RT-PCR (Flu A&B, Covid) Nasopharyngeal Swab     Status: None   Collection Time: 03/04/21  4:02 PM   Specimen: Nasopharyngeal Swab; Nasopharyngeal(NP) swabs in vial transport medium  Result Value Ref Range Status   SARS Coronavirus 2 by RT PCR NEGATIVE NEGATIVE Final    Comment: (NOTE) SARS-CoV-2 target nucleic acids are NOT DETECTED.  The SARS-CoV-2 RNA is generally detectable in upper respiratory specimens during the acute phase of infection. The lowest concentration of SARS-CoV-2 viral copies this assay can detect is 138 copies/mL. A negative result does not preclude SARS-Cov-2 infection and should not be used as the sole basis for treatment or other patient management decisions. A negative result may occur with  improper specimen collection/handling, submission of specimen other than nasopharyngeal swab, presence of viral mutation(s) within the areas targeted by this assay, and inadequate number of viral copies(<138 copies/mL). A negative result must be combined with clinical observations, patient history, and epidemiological information. The expected result is Negative.  Fact Sheet for Patients:  EntrepreneurPulse.com.au  Fact Sheet for Healthcare Providers:  IncredibleEmployment.be  This test is no t yet approved or cleared by the [place] FDA and  has been authorized for detection and/or diagnosis of SARS-CoV-2 by FDA under an Emergency Use Authorization (EUA). This EUA will remain  in effect (meaning this test can be used) for the duration of the COVID-19 declaration under Section 564(b)(1) of the Act, 21 U.S.C.section 360bbb-3(b)(1), unless the authorization is terminated  or revoked sooner.       Influenza A by PCR NEGATIVE NEGATIVE Final   Influenza B by PCR NEGATIVE NEGATIVE Final    Comment: (NOTE) The Xpert Xpress  SARS-CoV-2/FLU/RSV plus assay is intended as an aid in the diagnosis of influenza from Nasopharyngeal swab specimens and should not be used as a sole basis for treatment. Nasal washings and aspirates are unacceptable for Xpert Xpress SARS-CoV-2/FLU/RSV testing.  Fact Sheet for Patients: EntrepreneurPulse.com.au  Fact Sheet for Healthcare Providers: IncredibleEmployment.be  This test is not yet approved or cleared by the [place] FDA and has been authorized for detection and/or diagnosis of SARS-CoV-2 by FDA under an Emergency Use Authorization (EUA). This EUA will remain in effect (meaning this test can be used) for the duration of the COVID-19 declaration under Section 564(b)(1) of the Act, 21 U.S.C. section 360bbb-3(b)(1), unless the authorization is terminated or revoked.  Performed at [facility], [address], [place], [place] [address]   Culture, blood (routine x 2)  Status: None (Preliminary result)   Collection Time: 03/05/21  5:36 PM   Specimen: BLOOD LEFT HAND  Result Value Ref Range Status   Specimen Description BLOOD LEFT HAND  Final   Special Requests   Final    BOTTLES DRAWN AEROBIC ONLY Blood Culture results may not be optimal due to an inadequate volume of blood received in culture bottles   Culture   Final    NO GROWTH 2 DAYS Performed at [facility], [place] [address], [place], [place] [address]    Report Status PENDING  Incomplete  Culture, blood (routine x 2)     Status: None (Preliminary result)   Collection Time: 03/05/21  5:45 PM   Specimen: BLOOD LEFT HAND  Result Value Ref Range Status   Specimen Description BLOOD LEFT HAND  Final   Special Requests   Final    BOTTLES DRAWN AEROBIC ONLY Blood Culture results may not be optimal due to an inadequate volume of blood received in culture bottles   Culture   Final    NO GROWTH 2 DAYS Performed at [facility], [place] [address] [place],   [address]    Report Status PENDING  Incomplete     Serology: Pending qfever/brucella, legionella/bartonella pcr, crypto/endemic fungal serology Pending quantiferon gold rpr nonreactive hiv screen nonreactive  Micro: 4/30 blood cx ngtd 4/28 bcx ngtd  Imaging: If present, new imagings (plain films, ct scans, and mri) have been personally visualized and interpreted; radiology reports have been reviewed. Decision making incorporated into the Impression / Recommendations.  4/28 tte 1. 27 mm Freestyle AoV/Root replacement 01/2019. 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 There is a also a small  mobile mass under the NCC/LCC best seen in the  PLAX that measures 0.7 cm x 0.7 cm. These findings are highly concerning  for prosthetic valve endocarditis with involvement of the prosthetic  aortic root. The echo report from [facility] was reviewed via [place] (12/08/2020), where they metion  possible prosthetic valve dehisensce, but this is not a stented valve.  Suspect, this is related to root abscess based on appearance. TEE was not  performed at [facility]. Would recommend TEE or gated cardiac CT for  further clarification. Would also  recommend blood cultures. The aortic valve has been repaired/replaced.  Aortic valve regurgitation is not visualized. There is a 27 mm [employer]  homograft valve present in the aortic position. Procedure Date: 01/2019.  2. Left ventricular ejection fraction, by estimation, is 50 to 55%. The  left ventricle has low normal function. The left ventricle has no regional  wall motion abnormalities. There is moderate concentric left ventricular  hypertrophy. Left ventricular  diastolic function could not be evaluated.  3. Right ventricular systolic function is mildly reduced. The right  ventricular size is normal. There is moderately elevated pulmonary artery   systolic pressure. The estimated right ventricular systolic pressure is  19.6 mmHg.  4. Biatrial enlargement is present likely due to prior heart transplant  in 2001 that was likely performed usring a biatrial technique. Left atrial  size was severely dilated.  5. Right atrial size was severely dilated.  6. The mitral valve is grossly normal. Moderate mitral valve  regurgitation.  7. The inferior vena cava is dilated in size with >50% respiratory  variability, suggesting right atrial pressure of 8 mmHg.  8. Prior aortic root/AoV replacement 27  mm fresstyle. Concerns for aortic  root abscess detailed in the report. Aortic root/ascending aorta has been  repaired/replaced.   4/27 cta head/neck 1. No large vessel occlusion, significant stenosis, or aneurysm in the head and neck. 2. Emphysema   4/15 abd/pelv ct 1. Segmental wall thickening of the distal rectosigmoid colon, consistent with inflammatory or infectious colitis. 2. Mural thickening of the gastric antrum and pylorus, which could reflect gastritis or peptic ulcer disease. 3. Postsurgical changes from cardiac transplant. 4. Sequela of end-stage renal disease. 5. Aortic Atherosclerosis   Jabier Mutton, [place] [facility] [place] [phone] pager    03/08/2021, 12:37 PM

## 2021-03-08 NOTE — Progress Notes (Signed)
PROGRESS NOTE    Antonio Powell  EXB:[id number] DOB: 25-Oct-1976 DOA: 03/04/2021 PCP: [facility], [facility]   Brief Narrative:  Antonio Powell  is a 45 y.o. male, with history of HTN, HLD, Heart transplant at age 33 followed by TAVR at [facility], ESRD with HD on T TH S, and more presents to the ED with a chief complaint of loss of vision in the left eye.  Patient reports that on 23 April he had sudden onset of bright light in his left eye followed by complete darkness.  This was painless.   No other associated complaint.   The vision never came back. He had to wait till he can get a ride so on 03/04/2021, he was able to go see a provider.  He reports he saw his PCP who sent him to an ophthalmologist.  The ophthalmologist gave him eyedrops and sent him to the ER.  No previous history of such. He was admitted for colitis about a week ago, but reports that his nausea vomiting has cleared up since then.    In the ED, he was hemodynamically stable. CT head shows no acute intracranial abnormality.  Small chronic infarct right cerebellar hemisphere. CTA head and neck shows no large vessel occlusion, significant stenosis or aneurysm in the head or neck. Dr. Leonel Ramsay was consulted and advised admission for stroke work-up including A1c, lipids, echo, CTA head and neck (done above), and MRI brain. Patient does have a pacemaker and was transferred to [facility] as there was no MRI at [facility].  Unfortunately,MRI could not be done due to him having abandoned epicardial wires.  Seen by neurology.  They had nothing to offer however they switched from aspirin to Plavix.  Patient's vision remains impaired on the left side.  Transthoracic echo indicates possibility of aortic root abscess and possible vegetation/endocarditis.  This could very well explain possible retinal artery thromboembolism causing occlusion.  Consulted cardiology.  Received a call from [place] of cardiology that Dr. Haroldine Laws has  reviewed the case and has recommended to transfer the patient to [facility] since he had transplant there and he has cardiologist there.  Discussed with Dr. Davina Poke at [facility] and then discussed with Dr. Alric Ran cardiologist at [facility] who has accepted the patient in transfer.  Also called the transfer line.  No beds available.  Patient on the waiting list for transfer.  Blood cultures drawn and negative so far.  ID consulted and patient started on cefepime and vancomycin per their recommendations.   Assessment & Plan:   Principal Problem:   Retinal artery occlusion Active Problems:   Heart transplant recipient [facility])   ESRD (end stage renal disease) ([place])   Gastroesophageal reflux disease with esophagitis without hemorrhage   Protein-calorie malnutrition, moderate (HCC)   Tobacco use disorder   Endocarditis  Sudden painless vision loss on the left eye: No other focal deficit. CTA head and neck negative for any occlusion. Unfortunately,MRI cannot be done due to him having abandoned epicardial wires.  Seen by neurology.  They have nothing to offer.  Patient's vision remains impaired on the left side.  Transthoracic echo yesterday indicates possibility of aortic root abscess and possible vegetation/endocarditis.  This could very well explain possible presumption of retinal artery occlusion secondary to septic thromboembolism.  Vision remains impaired.  Possible aortic root abscess/endocarditis: Transthoracic echo shows aortic root abscess and possible endocarditis.  Blood culture remain negative.  Consulted cardiology.  Received a call from [place] of  cardiology that Dr. Haroldine Laws has reviewed the case and has recommended to transfer the patient to [facility] since he had transplant there and he has cardiologist there.  Discussed with Dr. Davina Poke at [facility] and then discussed with Dr. Alric Ran cardiologist at [facility] who has accepted the patient in transfer.  Also called the transfer  line.  No beds available.  Patient on the waiting list for transfer. Consulted ID and per the recommendations of starting on cefepime and vancomycin.  Defer further management to them.  [place] again today and there is no open bed available as of yet.  Patient remains on waiting list.  ESRD: Gets dialysis on TTS.  Nephrology on board.  Protein calorie malnutrition: Albumin 2.8.  Encourage nutrition.  Anemia of chronic disease: Hemoglobin is stable.  History of heart transplant: Continue antirejection medication as well as prednisone.  Hyperlipidemia: Continue statin and Colestid.  Essential hypertension: Blood pressure remains on the lower side now.  However he remains asymptomatic.  Continue Coreg and diltiazem but hold hydralazine.  GERD: Continue PPI and famotidine.  Tobacco dependence: Smoking cessation provided.  DVT prophylaxis: SCD's Start: 03/04/21 2343, per patient he is allergic to heparin.   Code Status: Full Code  Family Communication: None present at bedside.  Plan of care discussed with patient in length and he verbalized understanding and agreed with it.  Status is: Observation  The patient will require care spanning > 2 midnights and should be moved to inpatient because: Ongoing diagnostic testing needed not appropriate for outpatient work up  Dispo: The patient is from: Home              Anticipated d/c is to: [facility]              Patient currently is not medically stable to d/c.   Difficult to place patient No        Estimated body mass index is 19.19 kg/m as calculated from the following:   Height as of this encounter: 5\' 11"  (1.803 m).   Weight as of this encounter: 62.4 kg.      Nutritional status:               Consultants:   Neurology and nephrology and ID  Procedures:   None  Antimicrobials:  Anti-infectives (From admission, onward)   Start     Dose/Rate Route Frequency Ordered Stop   03/10/21 1800   vancomycin (VANCOREADY) IVPB 750 mg/150 mL       "Followed by" Linked Group Details   750 mg 150 mL/hr over 60 Minutes Intravenous Every T-Th-Sa (1800) 03/07/21 0842     03/07/21 2000  ceFEPIme (MAXIPIME) 1 g in sodium chloride 0.9 % 100 mL IVPB        1 g 200 mL/hr over 30 Minutes Intravenous Every 24 hours 03/07/21 0842     03/07/21 2000  vancomycin (VANCOREADY) IVPB 1500 mg/300 mL       "Followed by" Linked Group Details   1,500 mg 150 mL/hr over 120 Minutes Intravenous  Once 03/07/21 0842 03/07/21 1900         Subjective: Patient seen and examined.  Status quo.  No new complaint.  Objective: Vitals:   03/07/21 1700 03/07/21 1722 03/07/21 2129 03/08/21 0743  BP: 99/71 101/69 93/63 100/67  Pulse:   84 85  Resp: 19 18 18 16   Temp:  98.6 F (37 C) 98.4 F (36.9 C) 98.1 F (36.7 C)  TempSrc:  Oral Oral Oral  SpO2:  94% 93% 93%  Weight:  62.4 kg    Height:        Intake/Output Summary (Last 24 hours) at 03/08/2021 1150 Last data filed at 03/08/2021 0437 Gross per 24 hour  Intake 125.37 ml  Output 500 ml  Net -374.63 ml   Filed Weights   03/05/21 1700 03/07/21 1329 03/07/21 1722  Weight: 61 kg 61.6 kg 62.4 kg    Examination:  General exam: Appears calm and comfortable  Respiratory system: Clear to auscultation. Respiratory effort normal. Cardiovascular system: S1 & S2 heard, RRR. No JVD, very loud systolic murmur, rubs, gallops or clicks. No pedal edema. Gastrointestinal system: Abdomen is nondistended, soft and nontender. No organomegaly or masses felt. Normal bowel sounds heard. Central nervous system: Alert and oriented. No focal neurological deficits.  Vision deficit in left eye. Extremities: Symmetric 5 x 5 power. Skin: No rashes, lesions or ulcers.  Psychiatry: Judgement and insight appear normal. Mood & affect appropriate.   Data Reviewed: I have personally reviewed following labs and imaging studies  CBC: Recent Labs  Lab 03/04/21 1623 03/05/21 0314  03/07/21 0132  WBC 9.3 8.7 8.9  NEUTROABS 6.3  --  6.1  HGB 8.8* 8.3* 8.0*  HCT 25.5* 23.2* 22.7*  MCV 87.0 83.2 83.8  PLT 156 132* 992*   Basic Metabolic Panel: Recent Labs  Lab 03/04/21 1623 03/05/21 0314 03/07/21 1639 03/08/21 0926  NA 134* 133* 134* 136  K 4.3 4.6 5.8* 4.5  CL 95* 93* 94* 100  CO2 25 25 25 29   GLUCOSE 97 96 84 95  BUN 35* 37* 43* 22*  CREATININE 9.59* 10.69* 9.30* 5.39*  CALCIUM 9.6 9.2 9.5 9.1  PHOS  --   --  8.3* 5.2*   GFR: Estimated Creatinine Clearance: 15.8 mL/min (A) (by C-G formula based on SCr of 5.39 mg/dL (H)). Liver Function Tests: Recent Labs  Lab 03/04/21 1623 03/05/21 0314 03/07/21 1639 03/08/21 0926  AST 13* 11*  --   --   ALT 17 14  --   --   ALKPHOS 59 49  --   --   BILITOT 0.4 0.4  --   --   PROT 7.3 5.9*  --   --   ALBUMIN 2.8* 2.3* 2.4* 2.3*   No results for input(s): LIPASE, AMYLASE in the last 168 hours. No results for input(s): AMMONIA in the last 168 hours. Coagulation Profile: Recent Labs  Lab 03/04/21 1623  INR 1.1   Cardiac Enzymes: No results for input(s): CKTOTAL, CKMB, CKMBINDEX, TROPONINI in the last 168 hours. BNP (last 3 results) No results for input(s): PROBNP in the last 8760 hours. HbA1C: No results for input(s): HGBA1C in the last 72 hours. CBG: No results for input(s): GLUCAP in the last 168 hours. Lipid Profile: No results for input(s): CHOL, HDL, LDLCALC, TRIG, CHOLHDL, LDLDIRECT in the last 72 hours. Thyroid Function Tests: No results for input(s): TSH, T4TOTAL, FREET4, T3FREE, THYROIDAB in the last 72 hours. Anemia Panel: No results for input(s): VITAMINB12, FOLATE, FERRITIN, TIBC, IRON, RETICCTPCT in the last 72 hours. Sepsis Labs: No results for input(s): PROCALCITON, LATICACIDVEN in the last 168 hours.  Recent Results (from the past 240 hour(s))  Resp Panel by RT-PCR (Flu A&B, Covid) Nasopharyngeal Swab     Status: None   Collection Time: 03/04/21  4:02 PM   Specimen:  Nasopharyngeal Swab; Nasopharyngeal(NP) swabs in vial transport medium  Result Value Ref Range Status   SARS Coronavirus 2  by RT PCR NEGATIVE NEGATIVE Final    Comment: (NOTE) SARS-CoV-2 target nucleic acids are NOT DETECTED.  The SARS-CoV-2 RNA is generally detectable in upper respiratory specimens during the acute phase of infection. The lowest concentration of SARS-CoV-2 viral copies this assay can detect is 138 copies/mL. A negative result does not preclude SARS-Cov-2 infection and should not be used as the sole basis for treatment or other patient management decisions. A negative result may occur with  improper specimen collection/handling, submission of specimen other than nasopharyngeal swab, presence of viral mutation(s) within the areas targeted by this assay, and inadequate number of viral copies(<138 copies/mL). A negative result must be combined with clinical observations, patient history, and epidemiological information. The expected result is Negative.  Fact Sheet for Patients:  EntrepreneurPulse.com.au  Fact Sheet for Healthcare Providers:  IncredibleEmployment.be  This test is no t yet approved or cleared by the [place] FDA and  has been authorized for detection and/or diagnosis of SARS-CoV-2 by FDA under an Emergency Use Authorization (EUA). This EUA will remain  in effect (meaning this test can be used) for the duration of the COVID-19 declaration under Section 564(b)(1) of the Act, 21 U.S.C.section 360bbb-3(b)(1), unless the authorization is terminated  or revoked sooner.       Influenza A by PCR NEGATIVE NEGATIVE Final   Influenza B by PCR NEGATIVE NEGATIVE Final    Comment: (NOTE) The Xpert Xpress SARS-CoV-2/FLU/RSV plus assay is intended as an aid in the diagnosis of influenza from Nasopharyngeal swab specimens and should not be used as a sole basis for treatment. Nasal washings and aspirates are unacceptable for  Xpert Xpress SARS-CoV-2/FLU/RSV testing.  Fact Sheet for Patients: EntrepreneurPulse.com.au  Fact Sheet for Healthcare Providers: IncredibleEmployment.be  This test is not yet approved or cleared by the [place] FDA and has been authorized for detection and/or diagnosis of SARS-CoV-2 by FDA under an Emergency Use Authorization (EUA). This EUA will remain in effect (meaning this test can be used) for the duration of the COVID-19 declaration under Section 564(b)(1) of the Act, 21 U.S.C. section 360bbb-3(b)(1), unless the authorization is terminated or revoked.  Performed at [facility], [address], [place], [place] [address]   Culture, blood (routine x 2)     Status: None (Preliminary result)   Collection Time: 03/05/21  5:36 PM   Specimen: BLOOD LEFT HAND  Result Value Ref Range Status   Specimen Description BLOOD LEFT HAND  Final   Special Requests   Final    BOTTLES DRAWN AEROBIC ONLY Blood Culture results may not be optimal due to an inadequate volume of blood received in culture bottles   Culture   Final    NO GROWTH 2 DAYS Performed at [facility], [place] [address] [place], [place] [address]    Report Status PENDING  Incomplete  Culture, blood (routine x 2)     Status: None (Preliminary result)   Collection Time: 03/05/21  5:45 PM   Specimen: BLOOD LEFT HAND  Result Value Ref Range Status   Specimen Description BLOOD LEFT HAND  Final   Special Requests   Final    BOTTLES DRAWN AEROBIC ONLY Blood Culture results may not be optimal due to an inadequate volume of blood received in culture bottles   Culture   Final    NO GROWTH 2 DAYS Performed at [facility], [place] [address] [place], [place] [address]    Report Status PENDING  Incomplete  Radiology Studies: DG Chest Bilateral Decubitus  Result Date: 03/07/2021 CLINICAL DATA:  Endocarditis, history of heart transplant EXAM: CHEST - BILATERAL DECUBITUS VIEW  COMPARISON:  02/20/2021 FINDINGS: Bilateral decubitus views of the chest are obtained. Postsurgical changes are seen from cardiac transplant. Cardiac silhouette is stable. There are chronic areas of scarring seen at the lung bases. Dependent hypoventilatory changes are seen on the decubitus images. No acute airspace disease, effusion, or pneumothorax. No acute bony abnormalities. IMPRESSION: 1. Stable bibasilar parenchymal lung scarring. 2. Dependent hypoventilatory changes on decubitus views. No acute airspace disease. 3. Stable postsurgical changes from cardiac transplant. Electronically Signed   By: Randa Ngo M.D.   On: 03/07/2021 20:15    Scheduled Meds: . amitriptyline  10 mg Oral QHS  . calcium acetate  2,001 mg Oral TID WC  . carvedilol  6.25 mg Oral BID WC  . Chlorhexidine Gluconate Cloth  6 each Topical Q0600  . Chlorhexidine Gluconate Cloth  6 each Topical Q0600  . clopidogrel  75 mg Oral Daily  . colestipol  1 g Oral Daily  . diltiazem  360 mg Oral Daily  . famotidine  10 mg Oral QHS  . gabapentin  100 mg Oral TID  . melatonin  4.5 mg Oral QHS  . multivitamin  1 tablet Oral Daily  . mycophenolate  500 mg Oral BID  . nicotine  14 mg Transdermal Daily  . pantoprazole  40 mg Oral BID  . pravastatin  40 mg Oral q1800  . predniSONE  5 mg Oral Q breakfast  . sirolimus  1 mg Oral Daily  . tacrolimus  5 mg Oral Daily  . tacrolimus  6 mg Oral QHS   Continuous Infusions: . ceFEPime (MAXIPIME) IV 1 g (03/07/21 2034)  . [START ON 03/10/2021] vancomycin       LOS: 1 day   Time spent: 26 minutes   Darliss Cheney, MD [facility]  03/08/2021, 11:50 AM   To contact the attending provider between 7A-7P or the covering provider during after hours 7P-7A, please log into the web site [email or web address].

## 2021-03-08 NOTE — Progress Notes (Addendum)
Antonio Powell  Assessment/ Plan: Pt is a 45 y.o. yo male  with history of hypertension, nonischemic cardiomyopathy status post BiVAD and heart transplant in 12/2019, COVID-pneumonia requiring mechanical intubation, ESRD on HD TTS at [facility] presented with vision loss on left eye, seen as a consultation for the management of dialysis.  #Left eye vision changes with TIA work-up: Seen by stroke team.  CT scan with no acute finding.  Echocardiogram with possible prosthetic valve endocarditis/aortic root abscess, blood culture no growth so far.  Started empiric vancomycin and cefepime by ID.  Plan to transfer to [facility] noted.    # ESRD: TTS schedule at [facility]: Status post dialysis yesterday with around 500 cc UF, tolerated well.  Volume status acceptable, required midodrine for intradialytic hypotension.  Plan for next HD on 5/3.   AV fistula for the access.  # Hypertension/volume:  Midodrine for intradialytic hypotension, currently on cardiac medication.  Volume okay.    # Anemia of ESRD: Hemoglobin below goal but is stable, need records from outpatient.  No iron because of infection.  # Metabolic Bone Disease: Continue PhosLo.  Monitor electrolytes.  #Hyperkalemia: Received dialysis.  Follow-up lab result.  Subjective: Seen and examined at bedside.  No new event.  He feels good without any complaint.  Denies nausea, vomiting, chest pain, shortness of breath. Objective Vital signs in last 24 hours: Vitals:   03/07/21 1700 03/07/21 1722 03/07/21 2129 03/08/21 0743  BP: 99/71 101/69 93/63 100/67  Pulse:   84 85  Resp: 19 18 18 16   Temp:  98.6 F (37 C) 98.4 F (36.9 C) 98.1 F (36.7 C)  TempSrc:  Oral Oral Oral  SpO2:  94% 93% 93%  Weight:  62.4 kg    Height:       Weight change:   Intake/Output Summary (Last 24 hours) at 03/08/2021 0839 Last data filed at 03/08/2021 0437 Gross per 24 hour  Intake 125.37 ml  Output 500 ml   Net -374.63 ml       Labs: Basic Metabolic Panel: Recent Labs  Lab 03/04/21 1623 03/05/21 0314 03/07/21 1639  NA 134* 133* 134*  K 4.3 4.6 5.8*  CL 95* 93* 94*  CO2 25 25 25   GLUCOSE 97 96 84  BUN 35* 37* 43*  CREATININE 9.59* 10.69* 9.30*  CALCIUM 9.6 9.2 9.5  PHOS  --   --  8.3*   Liver Function Tests: Recent Labs  Lab 03/04/21 1623 03/05/21 0314 03/07/21 1639  AST 13* 11*  --   ALT 17 14  --   ALKPHOS 59 49  --   BILITOT 0.4 0.4  --   PROT 7.3 5.9*  --   ALBUMIN 2.8* 2.3* 2.4*   No results for input(s): LIPASE, AMYLASE in the last 168 hours. No results for input(s): AMMONIA in the last 168 hours. CBC: Recent Labs  Lab 03/04/21 1623 03/05/21 0314 03/07/21 0132  WBC 9.3 8.7 8.9  NEUTROABS 6.3  --  6.1  HGB 8.8* 8.3* 8.0*  HCT 25.5* 23.2* 22.7*  MCV 87.0 83.2 83.8  PLT 156 132* 104*   Cardiac Enzymes: No results for input(s): CKTOTAL, CKMB, CKMBINDEX, TROPONINI in the last 168 hours. CBG: No results for input(s): GLUCAP in the last 168 hours.  Iron Studies: No results for input(s): IRON, TIBC, TRANSFERRIN, FERRITIN in the last 72 hours. Studies/Results: DG Chest Bilateral Decubitus  Result Date: 03/07/2021 CLINICAL DATA:  Endocarditis, history of heart transplant  EXAM: CHEST - BILATERAL DECUBITUS VIEW COMPARISON:  02/20/2021 FINDINGS: Bilateral decubitus views of the chest are obtained. Postsurgical changes are seen from cardiac transplant. Cardiac silhouette is stable. There are chronic areas of scarring seen at the lung bases. Dependent hypoventilatory changes are seen on the decubitus images. No acute airspace disease, effusion, or pneumothorax. No acute bony abnormalities. IMPRESSION: 1. Stable bibasilar parenchymal lung scarring. 2. Dependent hypoventilatory changes on decubitus views. No acute airspace disease. 3. Stable postsurgical changes from cardiac transplant. Electronically Signed   By: Randa Ngo M.D.   On: 03/07/2021 20:15     Medications: Infusions: . ceFEPime (MAXIPIME) IV 1 g (03/07/21 2034)  . [START ON 03/10/2021] vancomycin      Scheduled Medications: . amitriptyline  10 mg Oral QHS  . calcium acetate  2,001 mg Oral TID WC  . carvedilol  6.25 mg Oral BID WC  . Chlorhexidine Gluconate Cloth  6 each Topical Q0600  . Chlorhexidine Gluconate Cloth  6 each Topical Q0600  . clopidogrel  75 mg Oral Daily  . colestipol  1 g Oral Daily  . diltiazem  360 mg Oral Daily  . famotidine  10 mg Oral QHS  . gabapentin  100 mg Oral TID  . melatonin  4.5 mg Oral QHS  . multivitamin  1 tablet Oral Daily  . mycophenolate  500 mg Oral BID  . nicotine  14 mg Transdermal Daily  . pantoprazole  40 mg Oral BID  . pravastatin  40 mg Oral q1800  . predniSONE  5 mg Oral Q breakfast  . sirolimus  1 mg Oral Daily  . tacrolimus  5 mg Oral Daily  . tacrolimus  6 mg Oral QHS    have reviewed scheduled and prn medications.  Physical Exam: General:NAD, able to lie flat on bed comfortable Heart:RRR, s1s2 nl Lungs: Clear b/l, no crackle Abdomen:soft, Non-tender, non-distended Extremities:No edema Dialysis Access: AV fistula with good thrill and bruit.  Antonio Powell Antonio Powell 03/08/2021,8:39 AM  LOS: 1 day

## 2021-03-09 DIAGNOSIS — Z952 Presence of prosthetic heart valve: Secondary | ICD-10-CM | POA: Diagnosis not present

## 2021-03-09 DIAGNOSIS — I6522 Occlusion and stenosis of left carotid artery: Secondary | ICD-10-CM | POA: Diagnosis not present

## 2021-03-09 DIAGNOSIS — D631 Anemia in chronic kidney disease: Secondary | ICD-10-CM | POA: Diagnosis not present

## 2021-03-09 DIAGNOSIS — I120 Hypertensive chronic kidney disease with stage 5 chronic kidney disease or end stage renal disease: Secondary | ICD-10-CM | POA: Diagnosis not present

## 2021-03-09 DIAGNOSIS — H53029 Refractive amblyopia, unspecified eye: Secondary | ICD-10-CM | POA: Diagnosis not present

## 2021-03-09 DIAGNOSIS — H5462 Unqualified visual loss, left eye, normal vision right eye: Secondary | ICD-10-CM | POA: Diagnosis not present

## 2021-03-09 DIAGNOSIS — T82897D Other specified complication of cardiac prosthetic devices, implants and grafts, subsequent encounter: Secondary | ICD-10-CM | POA: Diagnosis not present

## 2021-03-09 DIAGNOSIS — Z992 Dependence on renal dialysis: Secondary | ICD-10-CM | POA: Diagnosis not present

## 2021-03-09 DIAGNOSIS — E875 Hyperkalemia: Secondary | ICD-10-CM | POA: Diagnosis not present

## 2021-03-09 DIAGNOSIS — N186 End stage renal disease: Secondary | ICD-10-CM | POA: Diagnosis not present

## 2021-03-09 DIAGNOSIS — E8770 Fluid overload, unspecified: Secondary | ICD-10-CM | POA: Diagnosis not present

## 2021-03-09 DIAGNOSIS — H3412 Central retinal artery occlusion, left eye: Secondary | ICD-10-CM | POA: Diagnosis not present

## 2021-03-09 LAB — BARTONELLA ANTIBODY PANEL
B Quintana IgM: NEGATIVE titer
B henselae IgG: NEGATIVE titer
B henselae IgM: NEGATIVE titer
B quintana IgG: NEGATIVE titer

## 2021-03-09 LAB — Q FEVER ANTIBODIES, IGG
Q Fever Phase I: NEGATIVE
Q Fever Phase II: NEGATIVE

## 2021-03-09 LAB — RHEUMATOID FACTOR: Rheumatoid fact SerPl-aCnc: 16.1 IU/mL — ABNORMAL HIGH (ref <14.0)

## 2021-03-09 LAB — ANTISTREPTOLYSIN O TITER: ASO: 73 IU/mL (ref 0.0–200.0)

## 2021-03-09 LAB — HEPATITIS B E ANTIBODY: Hep B E Ab: NEGATIVE

## 2021-03-10 DIAGNOSIS — D631 Anemia in chronic kidney disease: Secondary | ICD-10-CM | POA: Diagnosis not present

## 2021-03-10 DIAGNOSIS — Z952 Presence of prosthetic heart valve: Secondary | ICD-10-CM | POA: Diagnosis not present

## 2021-03-10 DIAGNOSIS — H5462 Unqualified visual loss, left eye, normal vision right eye: Secondary | ICD-10-CM | POA: Diagnosis not present

## 2021-03-10 DIAGNOSIS — N186 End stage renal disease: Secondary | ICD-10-CM | POA: Diagnosis not present

## 2021-03-10 DIAGNOSIS — I083 Combined rheumatic disorders of mitral, aortic and tricuspid valves: Secondary | ICD-10-CM | POA: Diagnosis not present

## 2021-03-10 DIAGNOSIS — D509 Iron deficiency anemia, unspecified: Secondary | ICD-10-CM | POA: Diagnosis not present

## 2021-03-10 DIAGNOSIS — D696 Thrombocytopenia, unspecified: Secondary | ICD-10-CM | POA: Diagnosis not present

## 2021-03-10 DIAGNOSIS — I120 Hypertensive chronic kidney disease with stage 5 chronic kidney disease or end stage renal disease: Secondary | ICD-10-CM | POA: Diagnosis not present

## 2021-03-10 DIAGNOSIS — Z992 Dependence on renal dialysis: Secondary | ICD-10-CM | POA: Diagnosis not present

## 2021-03-10 DIAGNOSIS — Z950 Presence of cardiac pacemaker: Secondary | ICD-10-CM | POA: Diagnosis not present

## 2021-03-10 DIAGNOSIS — E8770 Fluid overload, unspecified: Secondary | ICD-10-CM | POA: Diagnosis not present

## 2021-03-10 DIAGNOSIS — Z4821 Encounter for aftercare following heart transplant: Secondary | ICD-10-CM | POA: Diagnosis not present

## 2021-03-10 DIAGNOSIS — H547 Unspecified visual loss: Secondary | ICD-10-CM | POA: Diagnosis not present

## 2021-03-10 DIAGNOSIS — D84821 Immunodeficiency due to drugs: Secondary | ICD-10-CM | POA: Diagnosis not present

## 2021-03-10 DIAGNOSIS — Z941 Heart transplant status: Secondary | ICD-10-CM | POA: Diagnosis not present

## 2021-03-10 DIAGNOSIS — E875 Hyperkalemia: Secondary | ICD-10-CM | POA: Diagnosis not present

## 2021-03-10 DIAGNOSIS — I080 Rheumatic disorders of both mitral and aortic valves: Secondary | ICD-10-CM | POA: Diagnosis not present

## 2021-03-10 DIAGNOSIS — I339 Acute and subacute endocarditis, unspecified: Secondary | ICD-10-CM | POA: Diagnosis not present

## 2021-03-10 DIAGNOSIS — R9439 Abnormal result of other cardiovascular function study: Secondary | ICD-10-CM | POA: Diagnosis not present

## 2021-03-10 DIAGNOSIS — I6522 Occlusion and stenosis of left carotid artery: Secondary | ICD-10-CM | POA: Diagnosis not present

## 2021-03-10 LAB — FLUORESCENT TREPONEMAL AB(FTA)-IGG-BLD: Fluorescent Treponemal Ab, IgG: NONREACTIVE

## 2021-03-10 LAB — CULTURE, BLOOD (ROUTINE X 2)
Culture: NO GROWTH
Culture: NO GROWTH

## 2021-03-10 LAB — BRUCELLA ANTIBODY IGG, EIA: Brucella Antibody IgG, EIA: NEGATIVE

## 2021-03-10 LAB — BRUCELLA ANTIBODY IGM, EIA: Brucella Antibody IgM, EIA: NEGATIVE

## 2021-03-11 DIAGNOSIS — H3412 Central retinal artery occlusion, left eye: Secondary | ICD-10-CM | POA: Diagnosis not present

## 2021-03-11 DIAGNOSIS — N186 End stage renal disease: Secondary | ICD-10-CM | POA: Diagnosis not present

## 2021-03-11 DIAGNOSIS — Z992 Dependence on renal dialysis: Secondary | ICD-10-CM | POA: Diagnosis not present

## 2021-03-11 DIAGNOSIS — D509 Iron deficiency anemia, unspecified: Secondary | ICD-10-CM | POA: Diagnosis not present

## 2021-03-11 DIAGNOSIS — Z950 Presence of cardiac pacemaker: Secondary | ICD-10-CM | POA: Diagnosis not present

## 2021-03-11 DIAGNOSIS — D631 Anemia in chronic kidney disease: Secondary | ICD-10-CM | POA: Diagnosis not present

## 2021-03-11 DIAGNOSIS — I120 Hypertensive chronic kidney disease with stage 5 chronic kidney disease or end stage renal disease: Secondary | ICD-10-CM | POA: Diagnosis not present

## 2021-03-11 DIAGNOSIS — I7789 Other specified disorders of arteries and arterioles: Secondary | ICD-10-CM | POA: Diagnosis not present

## 2021-03-11 DIAGNOSIS — Z4821 Encounter for aftercare following heart transplant: Secondary | ICD-10-CM | POA: Diagnosis not present

## 2021-03-11 DIAGNOSIS — Z79899 Other long term (current) drug therapy: Secondary | ICD-10-CM | POA: Diagnosis not present

## 2021-03-11 DIAGNOSIS — H53132 Sudden visual loss, left eye: Secondary | ICD-10-CM | POA: Diagnosis not present

## 2021-03-11 DIAGNOSIS — D84821 Immunodeficiency due to drugs: Secondary | ICD-10-CM | POA: Diagnosis not present

## 2021-03-11 DIAGNOSIS — Z952 Presence of prosthetic heart valve: Secondary | ICD-10-CM | POA: Diagnosis not present

## 2021-03-11 DIAGNOSIS — I330 Acute and subacute infective endocarditis: Secondary | ICD-10-CM | POA: Diagnosis not present

## 2021-03-11 DIAGNOSIS — Z941 Heart transplant status: Secondary | ICD-10-CM | POA: Diagnosis not present

## 2021-03-11 DIAGNOSIS — B9689 Other specified bacterial agents as the cause of diseases classified elsewhere: Secondary | ICD-10-CM | POA: Diagnosis not present

## 2021-03-11 LAB — BLASTOMYCES ANTIGEN: Blastomyces Antigen: NOT DETECTED ng/mL

## 2021-03-11 LAB — ASPERGILLUS ANTIBODY BY IMMUNODIFF
Aspergillus flavus: NEGATIVE
Aspergillus fumigatus, IgG: NEGATIVE
Aspergillus niger: NEGATIVE

## 2021-03-11 LAB — QUANTIFERON-TB GOLD PLUS: QuantiFERON-TB Gold Plus: NEGATIVE

## 2021-03-11 LAB — QUANTIFERON-TB GOLD PLUS (RQFGPL)
QuantiFERON Mitogen Value: 7.08 IU/mL
QuantiFERON Nil Value: 0.1 IU/mL
QuantiFERON TB1 Ag Value: 0.11 IU/mL
QuantiFERON TB2 Ag Value: 0.09 IU/mL

## 2021-03-12 DIAGNOSIS — D84821 Immunodeficiency due to drugs: Secondary | ICD-10-CM | POA: Diagnosis not present

## 2021-03-12 DIAGNOSIS — I120 Hypertensive chronic kidney disease with stage 5 chronic kidney disease or end stage renal disease: Secondary | ICD-10-CM | POA: Diagnosis not present

## 2021-03-12 DIAGNOSIS — Z941 Heart transplant status: Secondary | ICD-10-CM | POA: Diagnosis not present

## 2021-03-12 DIAGNOSIS — Z992 Dependence on renal dialysis: Secondary | ICD-10-CM | POA: Diagnosis not present

## 2021-03-12 DIAGNOSIS — N186 End stage renal disease: Secondary | ICD-10-CM | POA: Diagnosis not present

## 2021-03-12 DIAGNOSIS — Z79899 Other long term (current) drug therapy: Secondary | ICD-10-CM | POA: Diagnosis not present

## 2021-03-12 DIAGNOSIS — H53132 Sudden visual loss, left eye: Secondary | ICD-10-CM | POA: Diagnosis not present

## 2021-03-12 DIAGNOSIS — D631 Anemia in chronic kidney disease: Secondary | ICD-10-CM | POA: Diagnosis not present

## 2021-03-12 LAB — MISC LABCORP TEST (SEND OUT): Labcorp test code: 138350

## 2021-03-12 LAB — CULTURE, BLOOD (ROUTINE X 2)
Culture: NO GROWTH
Culture: NO GROWTH
Special Requests: ADEQUATE

## 2021-03-13 LAB — FUNGITELL, SERUM: Fungitell Result: 72 pg/mL (ref <80)

## 2021-03-14 DIAGNOSIS — N186 End stage renal disease: Secondary | ICD-10-CM | POA: Diagnosis not present

## 2021-03-14 DIAGNOSIS — I38 Endocarditis, valve unspecified: Secondary | ICD-10-CM | POA: Diagnosis not present

## 2021-03-14 DIAGNOSIS — Z992 Dependence on renal dialysis: Secondary | ICD-10-CM | POA: Diagnosis not present

## 2021-03-16 ENCOUNTER — Other Ambulatory Visit (HOSPITAL_COMMUNITY)
Admission: RE | Admit: 2021-03-16 | Discharge: 2021-03-16 | Disposition: A | Discharge status: Home / Self Care | Payer: Medicare Other | Source: Ambulatory Visit | Attending: Nurse Practitioner | Admitting: Nurse Practitioner

## 2021-03-16 ENCOUNTER — Other Ambulatory Visit: Payer: Self-pay

## 2021-03-16 DIAGNOSIS — I358 Other nonrheumatic aortic valve disorders: Secondary | ICD-10-CM | POA: Diagnosis not present

## 2021-03-17 DIAGNOSIS — Z992 Dependence on renal dialysis: Secondary | ICD-10-CM | POA: Diagnosis not present

## 2021-03-17 DIAGNOSIS — I38 Endocarditis, valve unspecified: Secondary | ICD-10-CM | POA: Diagnosis not present

## 2021-03-17 DIAGNOSIS — N186 End stage renal disease: Secondary | ICD-10-CM | POA: Diagnosis not present

## 2021-03-18 LAB — TACROLIMUS LEVEL: Tacrolimus (FK506) - LabCorp: 2.2 ng/mL (ref 2.0–20.0)

## 2021-03-19 DIAGNOSIS — Z992 Dependence on renal dialysis: Secondary | ICD-10-CM | POA: Diagnosis not present

## 2021-03-19 DIAGNOSIS — Z4509 Encounter for adjustment and management of other cardiac device: Secondary | ICD-10-CM | POA: Diagnosis not present

## 2021-03-19 DIAGNOSIS — I38 Endocarditis, valve unspecified: Secondary | ICD-10-CM | POA: Diagnosis not present

## 2021-03-19 DIAGNOSIS — I442 Atrioventricular block, complete: Secondary | ICD-10-CM | POA: Diagnosis not present

## 2021-03-19 DIAGNOSIS — N186 End stage renal disease: Secondary | ICD-10-CM | POA: Diagnosis not present

## 2021-03-20 DIAGNOSIS — N185 Chronic kidney disease, stage 5: Secondary | ICD-10-CM | POA: Diagnosis not present

## 2021-03-20 DIAGNOSIS — F17210 Nicotine dependence, cigarettes, uncomplicated: Secondary | ICD-10-CM | POA: Diagnosis not present

## 2021-03-20 DIAGNOSIS — I10 Essential (primary) hypertension: Secondary | ICD-10-CM | POA: Diagnosis not present

## 2021-03-20 DIAGNOSIS — Z681 Body mass index (BMI) 19 or less, adult: Secondary | ICD-10-CM | POA: Diagnosis not present

## 2021-03-20 DIAGNOSIS — K5090 Crohn's disease, unspecified, without complications: Secondary | ICD-10-CM | POA: Diagnosis not present

## 2021-03-20 DIAGNOSIS — Z992 Dependence on renal dialysis: Secondary | ICD-10-CM | POA: Diagnosis not present

## 2021-03-20 DIAGNOSIS — Z299 Encounter for prophylactic measures, unspecified: Secondary | ICD-10-CM | POA: Diagnosis not present

## 2021-03-24 DIAGNOSIS — D649 Anemia, unspecified: Secondary | ICD-10-CM | POA: Diagnosis not present

## 2021-03-24 DIAGNOSIS — N186 End stage renal disease: Secondary | ICD-10-CM | POA: Diagnosis not present

## 2021-03-24 DIAGNOSIS — R197 Diarrhea, unspecified: Secondary | ICD-10-CM | POA: Diagnosis not present

## 2021-03-24 DIAGNOSIS — Z79899 Other long term (current) drug therapy: Secondary | ICD-10-CM | POA: Diagnosis not present

## 2021-03-24 DIAGNOSIS — R1033 Periumbilical pain: Secondary | ICD-10-CM | POA: Diagnosis not present

## 2021-03-24 DIAGNOSIS — Z955 Presence of coronary angioplasty implant and graft: Secondary | ICD-10-CM | POA: Diagnosis not present

## 2021-03-24 DIAGNOSIS — K319 Disease of stomach and duodenum, unspecified: Secondary | ICD-10-CM | POA: Diagnosis not present

## 2021-03-24 DIAGNOSIS — R112 Nausea with vomiting, unspecified: Secondary | ICD-10-CM | POA: Diagnosis not present

## 2021-03-24 DIAGNOSIS — Z992 Dependence on renal dialysis: Secondary | ICD-10-CM | POA: Diagnosis not present

## 2021-03-24 DIAGNOSIS — I38 Endocarditis, valve unspecified: Secondary | ICD-10-CM | POA: Diagnosis not present

## 2021-03-24 DIAGNOSIS — R935 Abnormal findings on diagnostic imaging of other abdominal regions, including retroperitoneum: Secondary | ICD-10-CM | POA: Diagnosis not present

## 2021-03-26 DIAGNOSIS — N186 End stage renal disease: Secondary | ICD-10-CM | POA: Diagnosis not present

## 2021-03-26 DIAGNOSIS — Z992 Dependence on renal dialysis: Secondary | ICD-10-CM | POA: Diagnosis not present

## 2021-03-26 DIAGNOSIS — I38 Endocarditis, valve unspecified: Secondary | ICD-10-CM | POA: Diagnosis not present

## 2021-03-28 DIAGNOSIS — N186 End stage renal disease: Secondary | ICD-10-CM | POA: Diagnosis not present

## 2021-03-28 DIAGNOSIS — I38 Endocarditis, valve unspecified: Secondary | ICD-10-CM | POA: Diagnosis not present

## 2021-03-28 DIAGNOSIS — Z992 Dependence on renal dialysis: Secondary | ICD-10-CM | POA: Diagnosis not present

## 2021-03-30 DIAGNOSIS — I38 Endocarditis, valve unspecified: Secondary | ICD-10-CM | POA: Diagnosis not present

## 2021-03-31 DIAGNOSIS — N186 End stage renal disease: Secondary | ICD-10-CM | POA: Diagnosis not present

## 2021-03-31 DIAGNOSIS — N250 Renal osteodystrophy: Secondary | ICD-10-CM | POA: Diagnosis not present

## 2021-03-31 DIAGNOSIS — I38 Endocarditis, valve unspecified: Secondary | ICD-10-CM | POA: Diagnosis not present

## 2021-03-31 DIAGNOSIS — Z992 Dependence on renal dialysis: Secondary | ICD-10-CM | POA: Diagnosis not present

## 2021-04-01 DIAGNOSIS — N186 End stage renal disease: Secondary | ICD-10-CM | POA: Diagnosis not present

## 2021-04-01 DIAGNOSIS — I330 Acute and subacute infective endocarditis: Secondary | ICD-10-CM | POA: Diagnosis not present

## 2021-04-01 DIAGNOSIS — I120 Hypertensive chronic kidney disease with stage 5 chronic kidney disease or end stage renal disease: Secondary | ICD-10-CM | POA: Diagnosis not present

## 2021-04-01 DIAGNOSIS — Z79899 Other long term (current) drug therapy: Secondary | ICD-10-CM | POA: Diagnosis not present

## 2021-04-01 DIAGNOSIS — D84821 Immunodeficiency due to drugs: Secondary | ICD-10-CM | POA: Diagnosis not present

## 2021-04-01 DIAGNOSIS — F17210 Nicotine dependence, cigarettes, uncomplicated: Secondary | ICD-10-CM | POA: Diagnosis not present

## 2021-04-01 DIAGNOSIS — F17290 Nicotine dependence, other tobacco product, uncomplicated: Secondary | ICD-10-CM | POA: Diagnosis not present

## 2021-04-01 DIAGNOSIS — Z941 Heart transplant status: Secondary | ICD-10-CM | POA: Diagnosis not present

## 2021-04-01 DIAGNOSIS — Z4821 Encounter for aftercare following heart transplant: Secondary | ICD-10-CM | POA: Diagnosis not present

## 2021-04-01 DIAGNOSIS — Z992 Dependence on renal dialysis: Secondary | ICD-10-CM | POA: Diagnosis not present

## 2021-04-01 DIAGNOSIS — T826XXD Infection and inflammatory reaction due to cardiac valve prosthesis, subsequent encounter: Secondary | ICD-10-CM | POA: Diagnosis not present

## 2021-04-01 DIAGNOSIS — Z9889 Other specified postprocedural states: Secondary | ICD-10-CM | POA: Diagnosis not present

## 2021-04-01 DIAGNOSIS — I38 Endocarditis, valve unspecified: Secondary | ICD-10-CM | POA: Diagnosis not present

## 2021-04-01 DIAGNOSIS — B9689 Other specified bacterial agents as the cause of diseases classified elsewhere: Secondary | ICD-10-CM | POA: Diagnosis not present

## 2021-04-04 DIAGNOSIS — Z992 Dependence on renal dialysis: Secondary | ICD-10-CM | POA: Diagnosis not present

## 2021-04-04 DIAGNOSIS — I38 Endocarditis, valve unspecified: Secondary | ICD-10-CM | POA: Diagnosis not present

## 2021-04-04 DIAGNOSIS — N186 End stage renal disease: Secondary | ICD-10-CM | POA: Diagnosis not present

## 2021-04-07 DIAGNOSIS — Z992 Dependence on renal dialysis: Secondary | ICD-10-CM | POA: Diagnosis not present

## 2021-04-07 DIAGNOSIS — E46 Unspecified protein-calorie malnutrition: Secondary | ICD-10-CM | POA: Diagnosis not present

## 2021-04-07 DIAGNOSIS — I38 Endocarditis, valve unspecified: Secondary | ICD-10-CM | POA: Diagnosis not present

## 2021-04-07 DIAGNOSIS — N186 End stage renal disease: Secondary | ICD-10-CM | POA: Diagnosis not present

## 2021-04-09 DIAGNOSIS — Z7952 Long term (current) use of systemic steroids: Secondary | ICD-10-CM | POA: Diagnosis not present

## 2021-04-09 DIAGNOSIS — Z79899 Other long term (current) drug therapy: Secondary | ICD-10-CM | POA: Diagnosis not present

## 2021-04-09 DIAGNOSIS — I38 Endocarditis, valve unspecified: Secondary | ICD-10-CM | POA: Diagnosis not present

## 2021-04-09 DIAGNOSIS — N186 End stage renal disease: Secondary | ICD-10-CM | POA: Diagnosis not present

## 2021-04-09 DIAGNOSIS — I358 Other nonrheumatic aortic valve disorders: Secondary | ICD-10-CM | POA: Diagnosis not present

## 2021-04-09 DIAGNOSIS — B9689 Other specified bacterial agents as the cause of diseases classified elsewhere: Secondary | ICD-10-CM | POA: Diagnosis not present

## 2021-04-09 DIAGNOSIS — Z4821 Encounter for aftercare following heart transplant: Secondary | ICD-10-CM | POA: Diagnosis not present

## 2021-04-09 DIAGNOSIS — Z992 Dependence on renal dialysis: Secondary | ICD-10-CM | POA: Diagnosis not present

## 2021-04-10 DIAGNOSIS — D013 Carcinoma in situ of anus and anal canal: Secondary | ICD-10-CM | POA: Diagnosis not present

## 2021-04-10 DIAGNOSIS — Z941 Heart transplant status: Secondary | ICD-10-CM | POA: Diagnosis not present

## 2021-04-14 DIAGNOSIS — N250 Renal osteodystrophy: Secondary | ICD-10-CM | POA: Diagnosis not present

## 2021-04-14 DIAGNOSIS — Z992 Dependence on renal dialysis: Secondary | ICD-10-CM | POA: Diagnosis not present

## 2021-04-14 DIAGNOSIS — B9689 Other specified bacterial agents as the cause of diseases classified elsewhere: Secondary | ICD-10-CM | POA: Diagnosis not present

## 2021-04-14 DIAGNOSIS — N186 End stage renal disease: Secondary | ICD-10-CM | POA: Diagnosis not present

## 2021-04-14 DIAGNOSIS — I38 Endocarditis, valve unspecified: Secondary | ICD-10-CM | POA: Diagnosis not present

## 2021-04-16 DIAGNOSIS — I38 Endocarditis, valve unspecified: Secondary | ICD-10-CM | POA: Diagnosis not present

## 2021-04-16 DIAGNOSIS — N186 End stage renal disease: Secondary | ICD-10-CM | POA: Diagnosis not present

## 2021-04-16 DIAGNOSIS — E559 Vitamin D deficiency, unspecified: Secondary | ICD-10-CM | POA: Diagnosis not present

## 2021-04-16 DIAGNOSIS — Z992 Dependence on renal dialysis: Secondary | ICD-10-CM | POA: Diagnosis not present

## 2021-04-16 DIAGNOSIS — B9689 Other specified bacterial agents as the cause of diseases classified elsewhere: Secondary | ICD-10-CM | POA: Diagnosis not present

## 2021-04-17 DIAGNOSIS — I10 Essential (primary) hypertension: Secondary | ICD-10-CM | POA: Diagnosis not present

## 2021-04-17 DIAGNOSIS — I25811 Atherosclerosis of native coronary artery of transplanted heart without angina pectoris: Secondary | ICD-10-CM | POA: Diagnosis not present

## 2021-04-17 DIAGNOSIS — M109 Gout, unspecified: Secondary | ICD-10-CM | POA: Diagnosis not present

## 2021-04-17 DIAGNOSIS — F17210 Nicotine dependence, cigarettes, uncomplicated: Secondary | ICD-10-CM | POA: Diagnosis not present

## 2021-04-17 DIAGNOSIS — Z681 Body mass index (BMI) 19 or less, adult: Secondary | ICD-10-CM | POA: Diagnosis not present

## 2021-04-17 DIAGNOSIS — J309 Allergic rhinitis, unspecified: Secondary | ICD-10-CM | POA: Diagnosis not present

## 2021-04-17 DIAGNOSIS — Z299 Encounter for prophylactic measures, unspecified: Secondary | ICD-10-CM | POA: Diagnosis not present

## 2021-04-18 DIAGNOSIS — N186 End stage renal disease: Secondary | ICD-10-CM | POA: Diagnosis not present

## 2021-04-18 DIAGNOSIS — I38 Endocarditis, valve unspecified: Secondary | ICD-10-CM | POA: Diagnosis not present

## 2021-04-18 DIAGNOSIS — B9689 Other specified bacterial agents as the cause of diseases classified elsewhere: Secondary | ICD-10-CM | POA: Diagnosis not present

## 2021-04-18 DIAGNOSIS — Z992 Dependence on renal dialysis: Secondary | ICD-10-CM | POA: Diagnosis not present

## 2021-04-21 DIAGNOSIS — M79673 Pain in unspecified foot: Secondary | ICD-10-CM | POA: Diagnosis not present

## 2021-04-21 DIAGNOSIS — Z992 Dependence on renal dialysis: Secondary | ICD-10-CM | POA: Diagnosis not present

## 2021-04-21 DIAGNOSIS — N186 End stage renal disease: Secondary | ICD-10-CM | POA: Diagnosis not present

## 2021-04-21 DIAGNOSIS — Z941 Heart transplant status: Secondary | ICD-10-CM | POA: Diagnosis not present

## 2021-04-21 DIAGNOSIS — R109 Unspecified abdominal pain: Secondary | ICD-10-CM | POA: Diagnosis not present

## 2021-04-21 DIAGNOSIS — Z7982 Long term (current) use of aspirin: Secondary | ICD-10-CM | POA: Diagnosis not present

## 2021-04-21 DIAGNOSIS — Z79899 Other long term (current) drug therapy: Secondary | ICD-10-CM | POA: Diagnosis not present

## 2021-04-21 DIAGNOSIS — I120 Hypertensive chronic kidney disease with stage 5 chronic kidney disease or end stage renal disease: Secondary | ICD-10-CM | POA: Diagnosis not present

## 2021-04-21 DIAGNOSIS — Z4821 Encounter for aftercare following heart transplant: Secondary | ICD-10-CM | POA: Diagnosis not present

## 2021-04-21 DIAGNOSIS — N280 Ischemia and infarction of kidney: Secondary | ICD-10-CM | POA: Diagnosis not present

## 2021-04-23 DIAGNOSIS — Z992 Dependence on renal dialysis: Secondary | ICD-10-CM | POA: Diagnosis not present

## 2021-04-23 DIAGNOSIS — N186 End stage renal disease: Secondary | ICD-10-CM | POA: Diagnosis not present

## 2021-04-23 DIAGNOSIS — B9689 Other specified bacterial agents as the cause of diseases classified elsewhere: Secondary | ICD-10-CM | POA: Diagnosis not present

## 2021-04-23 DIAGNOSIS — I38 Endocarditis, valve unspecified: Secondary | ICD-10-CM | POA: Diagnosis not present

## 2021-04-24 DIAGNOSIS — Z79899 Other long term (current) drug therapy: Secondary | ICD-10-CM | POA: Diagnosis not present

## 2021-04-24 DIAGNOSIS — Z941 Heart transplant status: Secondary | ICD-10-CM | POA: Diagnosis not present

## 2021-04-28 DIAGNOSIS — N186 End stage renal disease: Secondary | ICD-10-CM | POA: Diagnosis not present

## 2021-04-28 DIAGNOSIS — I38 Endocarditis, valve unspecified: Secondary | ICD-10-CM | POA: Diagnosis not present

## 2021-04-28 DIAGNOSIS — N250 Renal osteodystrophy: Secondary | ICD-10-CM | POA: Diagnosis not present

## 2021-04-28 DIAGNOSIS — Z992 Dependence on renal dialysis: Secondary | ICD-10-CM | POA: Diagnosis not present

## 2021-04-28 DIAGNOSIS — B9689 Other specified bacterial agents as the cause of diseases classified elsewhere: Secondary | ICD-10-CM | POA: Diagnosis not present

## 2021-04-30 ENCOUNTER — Other Ambulatory Visit: Payer: Self-pay

## 2021-04-30 ENCOUNTER — Emergency Department (HOSPITAL_COMMUNITY)
Admission: EM | Admit: 2021-04-30 | Discharge: 2021-05-08 | Disposition: E | Payer: Medicare Other | Attending: Emergency Medicine | Admitting: Emergency Medicine

## 2021-04-30 ENCOUNTER — Encounter (HOSPITAL_COMMUNITY): Payer: Self-pay

## 2021-04-30 ENCOUNTER — Encounter: Payer: Self-pay | Admitting: Emergency Medicine

## 2021-04-30 DIAGNOSIS — Z7902 Long term (current) use of antithrombotics/antiplatelets: Secondary | ICD-10-CM | POA: Insufficient documentation

## 2021-04-30 DIAGNOSIS — I469 Cardiac arrest, cause unspecified: Secondary | ICD-10-CM | POA: Insufficient documentation

## 2021-04-30 DIAGNOSIS — I120 Hypertensive chronic kidney disease with stage 5 chronic kidney disease or end stage renal disease: Secondary | ICD-10-CM | POA: Insufficient documentation

## 2021-04-30 DIAGNOSIS — Z8616 Personal history of COVID-19: Secondary | ICD-10-CM | POA: Diagnosis not present

## 2021-04-30 DIAGNOSIS — M25512 Pain in left shoulder: Secondary | ICD-10-CM | POA: Diagnosis not present

## 2021-04-30 DIAGNOSIS — R0602 Shortness of breath: Secondary | ICD-10-CM | POA: Diagnosis present

## 2021-04-30 DIAGNOSIS — Z992 Dependence on renal dialysis: Secondary | ICD-10-CM | POA: Insufficient documentation

## 2021-04-30 DIAGNOSIS — I517 Cardiomegaly: Secondary | ICD-10-CM | POA: Diagnosis not present

## 2021-04-30 DIAGNOSIS — Z85048 Personal history of other malignant neoplasm of rectum, rectosigmoid junction, and anus: Secondary | ICD-10-CM | POA: Diagnosis not present

## 2021-04-30 DIAGNOSIS — I2510 Atherosclerotic heart disease of native coronary artery without angina pectoris: Secondary | ICD-10-CM | POA: Insufficient documentation

## 2021-04-30 DIAGNOSIS — J810 Acute pulmonary edema: Secondary | ICD-10-CM | POA: Insufficient documentation

## 2021-04-30 DIAGNOSIS — E162 Hypoglycemia, unspecified: Secondary | ICD-10-CM | POA: Diagnosis not present

## 2021-04-30 DIAGNOSIS — N186 End stage renal disease: Secondary | ICD-10-CM

## 2021-04-30 DIAGNOSIS — M25522 Pain in left elbow: Secondary | ICD-10-CM | POA: Diagnosis not present

## 2021-04-30 DIAGNOSIS — N289 Disorder of kidney and ureter, unspecified: Secondary | ICD-10-CM | POA: Insufficient documentation

## 2021-04-30 DIAGNOSIS — Z79899 Other long term (current) drug therapy: Secondary | ICD-10-CM | POA: Diagnosis not present

## 2021-04-30 DIAGNOSIS — F17210 Nicotine dependence, cigarettes, uncomplicated: Secondary | ICD-10-CM | POA: Diagnosis not present

## 2021-04-30 DIAGNOSIS — E876 Hypokalemia: Secondary | ICD-10-CM | POA: Diagnosis not present

## 2021-04-30 HISTORY — DX: Disorder of kidney and ureter, unspecified: N28.9

## 2021-04-30 NOTE — ED Triage Notes (Signed)
Pt reports waking up this morning short of breath with a cough. No fever Pt also reports left elbow/shoulder pain x 4 days

## 2021-05-01 ENCOUNTER — Emergency Department (HOSPITAL_COMMUNITY): Payer: Medicare Other

## 2021-05-01 ENCOUNTER — Encounter (HOSPITAL_COMMUNITY): Payer: Self-pay

## 2021-05-01 DIAGNOSIS — I469 Cardiac arrest, cause unspecified: Secondary | ICD-10-CM | POA: Diagnosis not present

## 2021-05-01 DIAGNOSIS — Z7960 Long term (current) use of unspecified immunomodulators and immunosuppressants: Secondary | ICD-10-CM | POA: Insufficient documentation

## 2021-05-01 DIAGNOSIS — D582 Other hemoglobinopathies: Secondary | ICD-10-CM | POA: Insufficient documentation

## 2021-05-01 DIAGNOSIS — I517 Cardiomegaly: Secondary | ICD-10-CM | POA: Diagnosis not present

## 2021-05-01 DIAGNOSIS — Z79899 Other long term (current) drug therapy: Secondary | ICD-10-CM | POA: Insufficient documentation

## 2021-05-01 DIAGNOSIS — Z515 Encounter for palliative care: Secondary | ICD-10-CM | POA: Insufficient documentation

## 2021-05-01 LAB — CBG MONITORING, ED
Glucose-Capillary: 122 mg/dL — ABNORMAL HIGH (ref 70–99)
Glucose-Capillary: 124 mg/dL — ABNORMAL HIGH (ref 70–99)
Glucose-Capillary: 21 mg/dL — CL (ref 70–99)

## 2021-05-01 LAB — COMPREHENSIVE METABOLIC PANEL
ALT: 1071 U/L — ABNORMAL HIGH (ref 0–44)
AST: 1687 U/L — ABNORMAL HIGH (ref 15–41)
Albumin: 2.1 g/dL — ABNORMAL LOW (ref 3.5–5.0)
Alkaline Phosphatase: 86 U/L (ref 38–126)
Anion gap: 32 — ABNORMAL HIGH (ref 5–15)
BUN: 58 mg/dL — ABNORMAL HIGH (ref 6–20)
CO2: 13 mmol/L — ABNORMAL LOW (ref 22–32)
Calcium: 11.6 mg/dL — ABNORMAL HIGH (ref 8.9–10.3)
Chloride: 96 mmol/L — ABNORMAL LOW (ref 98–111)
Creatinine, Ser: 13.24 mg/dL — ABNORMAL HIGH (ref 0.61–1.24)
GFR, Estimated: 4 mL/min — ABNORMAL LOW (ref >60)
Glucose, Bld: 278 mg/dL — ABNORMAL HIGH (ref 70–99)
Potassium: 6.4 mmol/L (ref 3.5–5.1)
Sodium: 141 mmol/L (ref 135–145)
Total Bilirubin: 0.8 mg/dL (ref 0.3–1.2)
Total Protein: 5.7 g/dL — ABNORMAL LOW (ref 6.5–8.1)

## 2021-05-01 LAB — TROPONIN I (HIGH SENSITIVITY): Troponin I (High Sensitivity): 2831 ng/L (ref <18)

## 2021-05-01 MED ORDER — EPINEPHRINE HCL 5 MG/250ML IV SOLN IN NS
0.5000 ug/min | INTRAVENOUS | Status: DC
  Filled 2021-05-01: qty 250

## 2021-05-01 MED ORDER — NOREPINEPHRINE 4 MG/250ML-% IV SOLN
INTRAVENOUS | Status: AC
  Administered 2021-05-01: 5 ug/min via INTRAVENOUS
  Filled 2021-05-01: qty 250

## 2021-05-01 MED ORDER — SODIUM BICARBONATE 8.4 % IV SOLN
INTRAVENOUS | Status: AC | PRN
  Administered 2021-05-01: 50 meq via INTRAVENOUS

## 2021-05-01 MED ORDER — EPINEPHRINE 1 MG/10ML IJ SOSY
PREFILLED_SYRINGE | INTRAMUSCULAR | Status: AC
  Filled 2021-05-01: qty 10

## 2021-05-01 MED ORDER — EPINEPHRINE 1 MG/10ML IJ SOSY
PREFILLED_SYRINGE | INTRAMUSCULAR | Status: AC
  Filled 2021-05-01: qty 20

## 2021-05-01 MED ORDER — EPINEPHRINE 1 MG/10ML IJ SOSY
PREFILLED_SYRINGE | INTRAMUSCULAR | Status: AC | PRN
  Administered 2021-05-01 (×5): 1 mg via INTRAVENOUS

## 2021-05-01 MED ORDER — EPINEPHRINE PF 1 MG/ML IJ SOLN
1.0000 mg | Freq: Once | INTRAMUSCULAR | Status: DC
  Filled 2021-05-01: qty 1

## 2021-05-01 MED ORDER — SODIUM BICARBONATE 8.4 % IV SOLN
INTRAVENOUS | Status: AC | PRN
  Administered 2021-05-01 (×2): 50 meq via INTRAVENOUS

## 2021-05-01 MED ORDER — EPINEPHRINE PF 1 MG/ML IJ SOLN
0.5000 ug/min | INTRAMUSCULAR | Status: DC
  Filled 2021-05-01: qty 5

## 2021-05-01 MED ORDER — EPINEPHRINE 1 MG/10ML IJ SOSY
PREFILLED_SYRINGE | INTRAMUSCULAR | Status: AC | PRN
  Administered 2021-05-01 (×6): 1 mg via INTRAVENOUS

## 2021-05-01 MED ORDER — DEXTROSE 50 % IV SOLN
INTRAVENOUS | Status: AC | PRN
  Administered 2021-05-01 (×2): 1 via INTRAVENOUS

## 2021-05-01 MED ORDER — EPINEPHRINE PF 1 MG/ML IJ SOLN: 1.0000 mg | Freq: Once | INTRAMUSCULAR | Status: DC

## 2021-05-01 MED ORDER — EPINEPHRINE 1 MG/10ML IJ SOSY
PREFILLED_SYRINGE | INTRAMUSCULAR | Status: AC | PRN
  Administered 2021-05-01 (×7): 1 mg via INTRAVENOUS

## 2021-05-01 MED ORDER — CALCIUM CHLORIDE 10 % IV SOLN
INTRAVENOUS | Status: AC | PRN
  Administered 2021-05-01 (×2): 1 g via INTRAVENOUS

## 2021-05-01 MED ORDER — NOREPINEPHRINE 4 MG/250ML-% IV SOLN: 0.0000 ug/min | INTRAVENOUS | Status: DC

## 2021-05-01 MED FILL — Medication: Qty: 1 | Status: AC

## 2021-05-07 DIAGNOSIS — N186 End stage renal disease: Secondary | ICD-10-CM | POA: Diagnosis not present

## 2021-05-07 DIAGNOSIS — Z992 Dependence on renal dialysis: Secondary | ICD-10-CM | POA: Diagnosis not present

## 2021-05-08 NOTE — Code Documentation (Addendum)
Iv infiltrated, IO placed by Lewie Loron

## 2021-05-08 NOTE — Code Documentation (Signed)
Patient time of death occurred at [phone]

## 2021-05-08 NOTE — Code Documentation (Signed)
No cardiac activity noted on ultrasound by Dr Roxanne Mins,

## 2021-05-08 NOTE — Code Documentation (Signed)
cbg 21, Dr Roxanne Mins aware and orders given,

## 2021-05-08 NOTE — ED Notes (Signed)
Date and time results received: 05/17/2021 0500  Test: potassium, troponin Critical Value: potassium 6.4, troponin 2,831  Name of Provider Notified: Dr. Peggye Pitt  Orders Received? Or Actions Taken?: no new orders

## 2021-05-08 NOTE — Code Documentation (Signed)
Cardiac activity noted on ultrasound by Dr Roxanne Mins,

## 2021-05-08 NOTE — ED Provider Notes (Signed)
[facility] EMERGENCY DEPARTMENT Provider Note   CSN: [id number] Arrival date & time: 05/05/2021  2242     History Chief Complaint  Patient presents with   Shortness of Breath    Elbow and shoulder pain (left) x 4 days    Antonio Powell is a 45 y.o. male.  The history is provided by medical records. The history is limited by the condition of the patient (Unresponsive).  Shortness of Breath He has history of hypertension, end-stage renal disease on hemodialysis, heart transplant, endocarditis and came to the emergency department complaining of shortness of breath and cough as well as left elbow and shoulder pain.  When I went in the room to evaluate him, he was noted to be apneic and pulseless.   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   Retinal artery occlusion 03/04/2021   Protein-calorie malnutrition, moderate ([place]) 03/04/2021   Gastroesophageal reflux disease with esophagitis without hemorrhage    Colitis 02/20/2021   Acute anemia 01/16/2021   Scrotal wall abscess 12/05/2020   Abnormal CT scan, neck 12/01/2020   Fall 12/01/2020   Encephalopathy acute 11/30/2020   Sepsis due to severe acute respiratory syndrome coronavirus 2 (SARS-CoV-2) ([place]) 11/26/2020   Acute hypoxemic respiratory failure due to COVID-19 ([place]) 11/25/2020   Gastrointestinal hemorrhage with hematemesis 11/25/2020   End stage renal disease ([place]) 07/23/2019   CHB (complete heart block) ([place]) 07/07/2019   Pacemaker 07/07/2019   ESRD on hemodialysis ([place]) 03/20/2019   Small bowel obstruction ([place]) 03/20/2019   Endocarditis of aortic valve  01/24/2019   Leukocytosis 08/26/2018   Anal intraepithelial neoplasia III 10/11/2017   Cancer of anorectal junction ([place]) 07/29/2016   Iron deficiency anemia 07/29/2016   Anemia due to blood loss, acute 36/62/9476   Umbilical pain 54/65/0354   S/P AVR (aortic valve replacement) 05/14/2014   Coronary artery disease involving native artery of transplanted heart without angina pectoris 01/26/2014   History of gastroesophageal reflux (GERD) 08/17/2011   Hyperuricemia 08/17/2011   Hypomagnesemia 08/17/2011   Tobacco use 08/13/2011   Essential hypertension 08/13/2011    Past Surgical History:  Procedure Laterality Date   APPENDECTOMY     CARDIAC SURGERY     CHOLECYSTECTOMY     HERNIA REPAIR     left ear         History reviewed. No pertinent family history.  Social History   Tobacco Use   Smoking status: Every Day    Packs/day: 0.50    Pack years: 0.00    Types: Cigarettes   Smokeless tobacco: Never   Tobacco comments:    black and mild  Vaping Use   Vaping Use: Never used  Substance Use Topics   Alcohol use: Yes    Comment: occ   Drug use: Yes    Frequency: 2.0 times per week    Types: Marijuana    Home Medications Prior to Admission medications   Medication Sig Start Date End Date Taking? Authorizing Provider  acetaminophen (TYLENOL) 500 MG tablet Take 500 mg by mouth every 4 (four) hours as needed for moderate pain.    [provider]  allopurinol (ZYLOPRIM) 100 MG tablet Take 100 mg by mouth daily.    [provider]  amitriptyline (ELAVIL) 10 MG tablet Take 10 mg by mouth at bedtime. 01/12/21   [provider]  calcium acetate (PHOSLO) 667 MG capsule Take 2,001 mg by mouth 3 (three) times daily with meals. 2001 mg with meals and 1334 with snacks 06/10/20   [provider]  carvedilol (COREG) 6.25 MG tablet Take 6.25 mg by mouth 2 (two) times daily.     [provider]  ceFEPIme 1 g in sodium chloride 0.9 % 100 mL Inject  1 g into the vein daily. 03/08/21   Darliss Cheney, MD  clopidogrel (PLAVIX) 75 MG tablet Take 1 tablet (75 mg total) by mouth daily. 03/09/21   Darliss Cheney, MD  colestipol (COLESTID) 1 g tablet Take 1 tablet by mouth daily. 09/16/20   [provider]  cyclobenzaprine (FLEXERIL) 10 MG tablet Take 10 mg by mouth 2 (two) times daily. 10/22/20   [provider]  diltiazem (CARDIZEM CD) 360 MG 24 hr capsule Take 360 mg by mouth in the morning and at bedtime. 01/12/21   [provider]  famotidine (PEPCID) 10 MG tablet Take 1 tablet (10 mg total) by mouth at bedtime. 02/21/21   Barton Dubois, MD  folic acid (FOLVITE) 1 MG tablet Take 1 mg by mouth daily.    [provider]  gabapentin (NEURONTIN) 100 MG capsule Take 100 mg by mouth 3 (three) times daily. 11/03/20   [provider]  hydrALAZINE (APRESOLINE) 50 MG tablet Take 50 mg by mouth in the morning and at bedtime.    [provider]  hyoscyamine (ANASPAZ) 0.125 MG TBDP disintergrating tablet Place 0.125 mg under the tongue every 6 (six) hours as needed for cramping. 02/12/20 03/04/21  [provider]  imiquimod (ALDARA) 5 % cream Apply 1 application topically 3 (three) times a week. 10/06/20   [provider]  linaclotide Rolan Lipa) 145 MCG CAPS capsule Take 1 capsule (145 mcg total) by mouth daily before breakfast. Hold for diarrhea 02/21/21   Barton Dubois, MD  loperamide (IMODIUM) 2 MG capsule Take 2-4 mg by mouth every 6 (six) hours as needed. 11/11/20   [provider]  magnesium oxide (MAG-OX) 400 MG tablet Take 400 mg by mouth daily.    [provider]  melatonin 3 MG TABS tablet Take 1.5 tablets (4.5 mg total) by mouth at bedtime. 02/21/21 12/13/22  Barton Dubois, MD  mycophenolate (CELLCEPT) 250 MG capsule Take 500 mg by mouth 2 (two) times daily. 12/20/20 01/12/22  [provider]  ondansetron (ZOFRAN) 4 MG tablet Take 4 mg by mouth every 8 (eight) hours as  needed for nausea or vomiting.    [provider]  ondansetron (ZOFRAN-ODT) 4 MG disintegrating tablet Take 4 mg by mouth every 8 (eight) hours as needed. 02/19/21   [provider]  pantoprazole (PROTONIX) 40 MG tablet Take 1 tablet (40 mg total) by mouth 2 (two) times daily. 02/21/21   Barton Dubois, MD  pravastatin (PRAVACHOL) 40 MG tablet Take 40 mg by mouth daily.    [provider]  predniSONE (DELTASONE) 5 MG tablet Take 5 mg by mouth daily with breakfast. 01/12/21   [provider]  sirolimus (RAPAMUNE) 1 MG tablet Take 1 mg by mouth daily.    [provider]  sucralfate (CARAFATE) 1 GM/10ML suspension Take 10 mLs (1 g total) by mouth 4 (four) times daily -  with meals and at bedtime for 14 days. 02/21/21 03/07/21  Barton Dubois, MD  tacrolimus (PROGRAF) 1 MG capsule Take 6 mg by mouth at bedtime. Take 5 mg every morning    [provider]  tacrolimus (PROGRAF) 5 MG capsule Take 5 mg by mouth daily. Take 5 mg every morning and 6 mg every evening    [provider]  vancomycin (VANCOREADY) 750 MG/150ML SOLN Inject 150 mLs (750 mg total) into the vein every Tuesday, Thursday, and Saturday at 6 PM. 03/10/21   Darliss Cheney, MD    Allergies    Nsaids, Other, Tolmetin, Varenicline, Phenergan [promethazine hcl], and Promethazine  Review of Systems   Review of Systems  Unable to perform ROS: Patient unresponsive  Respiratory:  Positive for shortness of breath.    Physical Exam Updated Vital Signs BP 97/67 (BP Location: Right Arm)   Pulse (!) 105   Temp 98.1 F (36.7 C) (Oral)   Resp 20   Ht 5\' 11"  (1.803 m)   Wt 60.3 kg   SpO2 94%   BMI 18.55 kg/m   Physical Exam Vitals and nursing note reviewed.  45 year old male, apneic, pulseless, unresponsive. Head is normocephalic and atraumatic.  Pupils 5 mm and unreactive. Neck is without adenopathy JVD. Lungs no breath sounds. Chest is without evidence of trauma. Heart tones are  absent. Abdomen is soft, flat Extremities have no cyanosis or edema. Skin is warm and dry without rash. Neurologic: GCS=3.  No spontaneous movement or respirations.  ED Results / Procedures / Treatments   Labs (all labs ordered are listed, but only abnormal results are displayed) Labs Reviewed  CBG MONITORING, ED - Abnormal; Notable for the following components:      Result Value   Glucose-Capillary 21 (*)    All other components within normal limits  CBG MONITORING, ED - Abnormal; Notable for the following components:   Glucose-Capillary 122 (*)    All other components within normal limits  CBG MONITORING, ED - Abnormal; Notable for the following components:   Glucose-Capillary 124 (*)    All other components within normal limits  RESP PANEL BY RT-PCR (FLU A&B, COVID) [id number]  COMPREHENSIVE METABOLIC PANEL  CBC WITH DIFFERENTIAL/PLATELET  TROPONIN I (HIGH SENSITIVITY)    EKG EKG Interpretation  Date/Time:  05-07-21 02:34:11 EDT Ventricular Rate:  50 PR Interval:  74 QRS Duration: 177 QT Interval:  464 QTC Calculation: 424 R Axis:   48 Text Interpretation: Sinus rhythm Ventricular premature complex Short PR interval LAE, consider biatrial enlargement Left bundle branch block When compared with ECG of 03/04/2021, HEART RATE has decreased Left bundle branch block is now present ST elevation present in anteroseptal leads, possible acute MI Confirmed by Delora Fuel ([id number]) on 05/07/2021 3:46:46 AM  Radiology DG Chest Port 1 View  Result Date: 2021/05/07 CLINICAL DATA:  Post CPR. Cardiac arrest. History of heart transplant. EXAM: PORTABLE CHEST 1 VIEW COMPARISON:  Chest x-ray 03/07/2021, CT abdomen pelvis 02/20/2021 FINDINGS: The endotracheal tube is noted 1 cm into the right mainstem bronchus. Cardiac paddles are noted overlying the patient. Enlarged cardiac silhouette. The heart size and mediastinal contours are otherwise unremarkable. Cardiac surgical changes overlie  the mediastinum. Wireless cardiac device noted. Diffuse bilateral fluffy airspace and interstitial opacities, right greater than left. No pulmonary edema. Blunting of bilateral costophrenic angles with likely trace pleural effusions. No pneumothorax. No acute osseous abnormality. IMPRESSION: 1. Right mainstem bronchus intubation. Recommend retraction of endotracheal tube by 4cm.  2. Pulmonary findings suggestive of pulmonary edema with superimposed infection/inflammation not excluded. 3. Likely trace bilateral pleural effusions. 4. Cardiomegaly. These results will be called to the ordering clinician or representative by the Radiologist Assistant, and communication documented in the PACS or [employer]. Electronically Signed   By: Iven Finn M.D.   On: 05-14-21 03:48    Procedures Cardiopulmonary Resuscitation (CPR) Procedure Note Directed/Performed by: Delora Fuel I personally directed ancillary staff and/or performed CPR in an effort to regain return of spontaneous circulation and to maintain cardiac, neuro and systemic perfusion.   Date/Time: 05/14/2021 2:02 AM Performed by: Delora Fuel, MD Pre-anesthesia Checklist: Patient identified, Suction available, Emergency Drugs available, Patient being monitored and Timeout performed Oxygen Delivery Method: Non-rebreather mask Preoxygenation: Pre-oxygenation with 100% oxygen Ventilation: Mask ventilation without difficulty Laryngoscope Size: Glidescope and 4 Grade View: Grade I Tube size: 7.5 mm Airway Equipment and Method: Rigid stylet and Video-laryngoscopy Placement Confirmation: ETT inserted through vocal cords under direct vision, CO2 detector and Breath sounds checked- equal and bilateral Secured at: 26 cm Tube secured with: ETT holder Dental Injury: Teeth and Oropharynx as per pre-operative assessment     .Central Line  Date/Time: 05-14-21 3:00 AM Performed by: Delora Fuel, MD Authorized by: Delora Fuel, MD   Consent:     Consent obtained:  Emergent situation Universal protocol:    Required blood products, implants, devices, and special equipment available: yes     Site/side marked: yes     Immediately prior to procedure, a time out was called: yes     Patient identity confirmed:  Hospital-assigned identification number and arm band Pre-procedure details:    Indication(s): central venous access and insufficient peripheral access     Hand hygiene: Hand hygiene performed prior to insertion     Sterile barrier technique: All elements of maximal sterile technique followed     Skin preparation:  Chlorhexidine   Skin preparation agent: Skin preparation agent completely dried prior to procedure   Sedation:    Sedation type:  None Anesthesia:    Anesthesia method:  None Procedure details:    Location:  R femoral   Site selection rationale:  Ease of access that would not interfere with CPR   Patient position:  Supine   Procedural supplies:  Triple lumen   Catheter size:  5 Fr   Landmarks identified: yes     Ultrasound guidance: no     Number of attempts:  1   Successful placement: yes   Post-procedure details:    Post-procedure:  Dressing applied and line sutured   Assessment:  Blood return through all ports   Procedure completion:  Tolerated well, no immediate complications   CRITICAL CARE Performed by: Delora Fuel Total critical care time: 60 minutes Critical care time was exclusive of separately billable procedures and treating other patients. Critical care was necessary to treat or prevent imminent or life-threatening deterioration. Critical care was time spent personally by me on the following activities: development of treatment plan with patient and/or surrogate as well as nursing, discussions with consultants, evaluation of patient's response to treatment, examination of patient, obtaining history from patient or surrogate, ordering and performing treatments and interventions, ordering and review of  laboratory studies, ordering and review of radiographic studies, pulse oximetry and re-evaluation of patient's condition.  Medications Ordered in ED Medications  norepinephrine (LEVOPHED) 4mg  in 226mL premix infusion (5 mcg/min Intravenous New Bag/Given May 14, 2021 0241)  EPINEPHrine (ADRENALIN) 1 mg (has no administration in time range)  EPINEPHrine (  ADRENALIN) 1 mg (has no administration in time range)  EPINEPHrine (ADRENALIN) 1 MG/10ML injection (has no administration in time range)  EPINEPHrine (ADRENALIN) 5 mg in sodium chloride 0.9 % 250 mL (0.02 mg/mL) infusion (has no administration in time range)  EPINEPHrine (ADRENALIN) 1 MG/10ML injection (has no administration in time range)  EPINEPHrine (ADRENALIN) 1 MG/10ML injection (1 mg Intravenous Given May 18, 2021 0254)  sodium bicarbonate injection (50 mEq Intravenous Given 18-May-2021 0247)  dextrose 50 % solution (1 ampule Intravenous Given May 18, 2021 0252)    ED Course  I have reviewed the triage vital signs and the nursing notes.  Pertinent labs & imaging results that were available during my care of the patient were reviewed by me and considered in my medical decision making (see chart for details).   MDM Rules/Calculators/A&P                         Patient apparently presented with cough and shortness of breath and elbow and shoulder pain but was in cardiac arrest when I entered the room  CODE BLUE was called and CPR initiated.  Patient was intubated.  Monitor showed bradycardic rhythm with wide-complex.  He was given multiple rounds of epinephrine as well as sodium bicarbonate, calcium with no change in rhythm and no palpable pulses.  Bedside ultrasound did show slight cardiac activity and carotid pulse was detectable by Doppler and he did have a measurable blood pressure in the 60s.  He was started on norepinephrine drip.  CBG was noted to be 20 and he was given glucose with improvement of glucose levels.  Unfortunately, rhythm deteriorated to  asystole and CPR was initiated again and he was given additional epinephrine and bicarbonate with restoration of very faint pulses.  Right femoral line was inserted for medication administration.  Epinephrine drip was ordered, but patient deteriorated before could be started.  CPR was reinitiated and he was given additional epinephrine and bicarbonate without significant change.  Bedside ultrasound at this point showed minimal cardiac activity, no detectable blood pressure and he was pronounced dead at 3:28 AM.  Case was discussed with Mr. Bobby Rumpf, medical examiner and was felt to be a medical death and not a medical examiner's case.  Chest x-ray had been obtained showing ET tube at the carina, bilateral pulmonary infiltrates consistent with pulmonary edema.  Old records were reviewed confirming history of heart transplant, recent antibiotics for endocarditis, history of end-stage renal disease.  Father was advised of outcome patient.  Final Clinical Impression(s) / ED Diagnoses Final diagnoses:  Cardiac arrest ([place])  Acute pulmonary edema ([place])  Hypoglycemia  End-stage renal disease on hemodialysis [facility])    Rx / DC Orders ED Discharge Orders     None        Delora Fuel, MD 69/62/95 [phone]

## 2021-05-08 NOTE — Code Documentation (Signed)
cpr started again,

## 2021-05-08 DEATH — deceased

## 2021-05-12 MED FILL — Medication: Qty: 1 | Status: AC

## 2023-01-02 IMAGING — CT CT ANGIO NECK
2 of 7 series · 8 of 33 positions shown · IV contrast (Omnipaque or Isovue)
Comparison: None.

CLINICAL DATA: Left eye vision loss for 5 days.

EXAM:
CT ANGIOGRAPHY HEAD AND NECK
TECHNIQUE: Multidetector CT imaging of the head and neck was performed using
the standard protocol during bolus administration of intravenous
contrast. Multiplanar CT image reconstructions and MIPs were
obtained to evaluate the vascular anatomy. Carotid stenosis
measurements (when applicable) are obtained utilizing NASCET
criteria, using the distal internal carotid diameter as the
denominator.
CONTRAST:  75mL OMNIPAQUE IOHEXOL 350 MG/ML SOLN

[Series 4: cta head & neck · axial · 0.55mm/px · z∈[+1406,+1516]mm · 2 of 167 slices shown]
[im 56/167  soft-tissue]
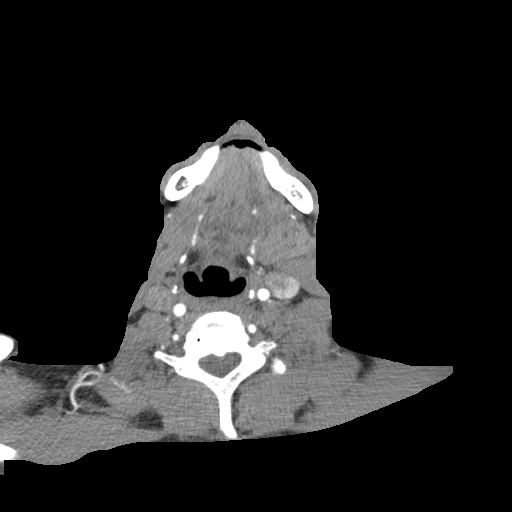
[im 111/167  soft-tissue]
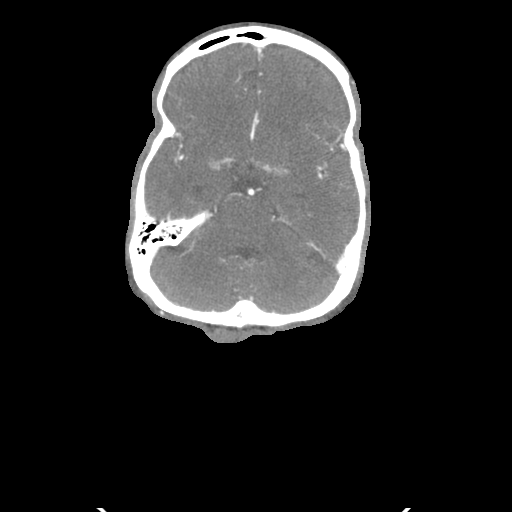

[Series 6: ax thins · axial · 0.39mm/px · z∈[+1348,+1585]mm · 6 of 334 slices shown]
[im 48/334  soft-tissue]
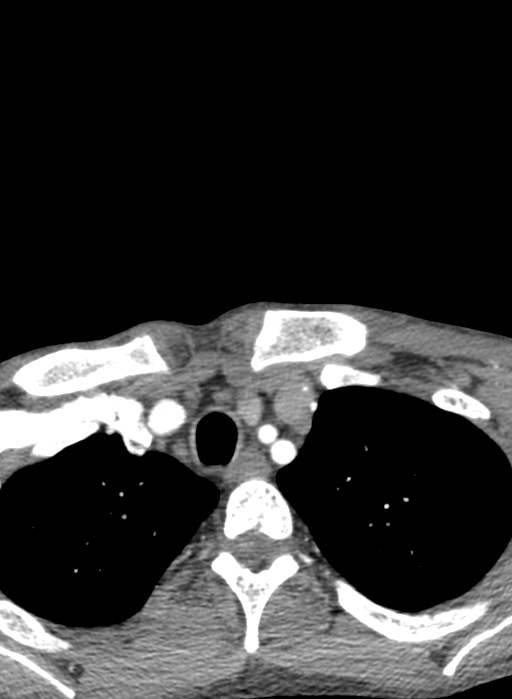
[im 96/334  bone]
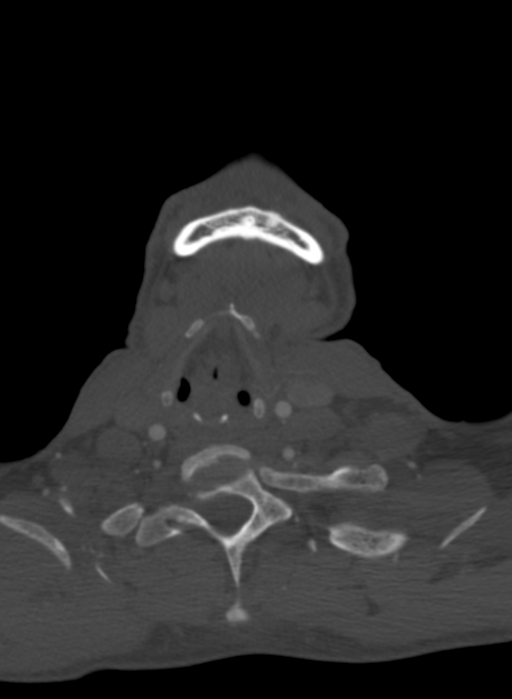
[im 143/334  soft-tissue]
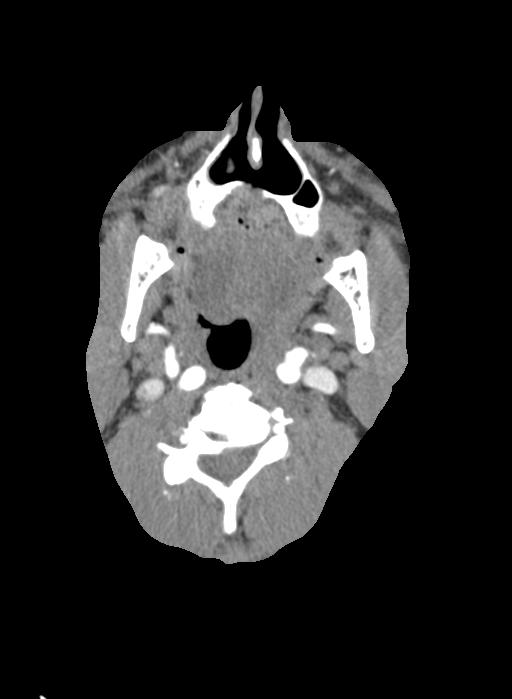
[im 191/334  bone]
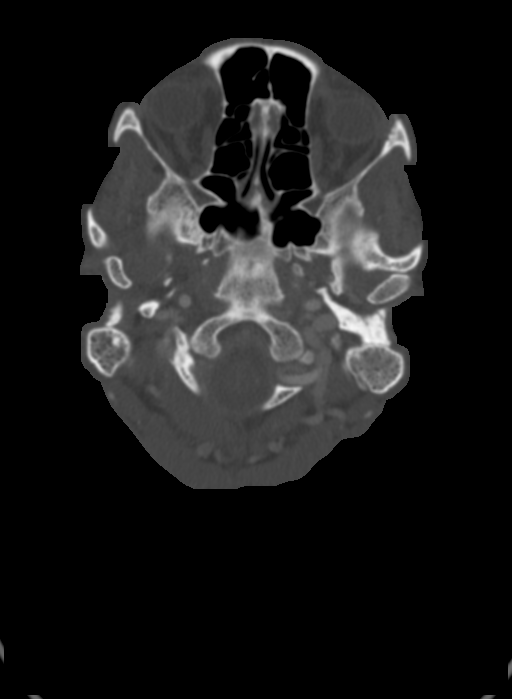
[im 238/334  soft-tissue]
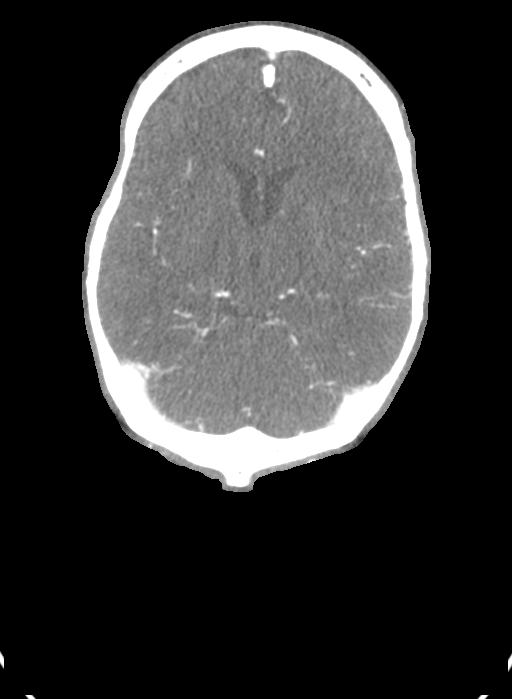
[im 286/334  bone]
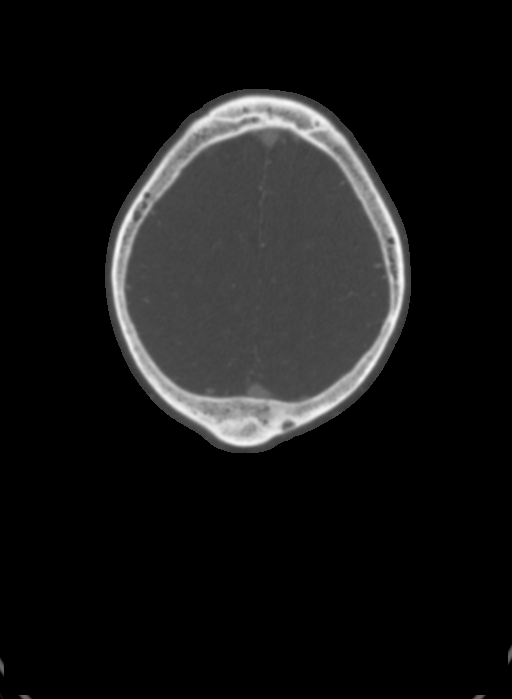

[8 of 33 positions shown; findings below may reference images not displayed]

FINDINGS: CTA NECK FINDINGS

Aortic arch: Normal variant aortic arch branching pattern with
common origin of the brachiocephalic and left common carotid
arteries.

Right carotid system: Patent without evidence of stenosis,
dissection, or significant atherosclerosis.

Left carotid system: Patent without evidence of stenosis,
dissection, or significant atherosclerosis.

Vertebral arteries: Patent without evidence of stenosis, dissection,
or significant atherosclerosis.

Skeleton: Edentulous. Moderate multilevel disc space narrowing in
the cervical spine.

Other neck: No evidence of cervical lymphadenopathy or mass.

Upper chest: Mild centrilobular emphysema in the lung apices with
incompletely imaged asymmetric subpleural reticulation in the right
upper lobe which may reflect scarring/mild fibrosis.

Review of the MIP images confirms the above findings

CTA HEAD FINDINGS

Anterior circulation: The internal carotid arteries are widely
patent from skull base to carotid termini. ACAs and MCAs are patent
without evidence of a proximal branch occlusion or significant
proximal stenosis. No aneurysm is identified.

Posterior circulation: The intracranial vertebral arteries are
widely patent to the basilar. Patent PICA and SCA origins are seen
bilaterally. The basilar artery is widely patent. Posterior
communicating arteries are diminutive or absent. Both PCAs are
patent without evidence of a significant proximal stenosis. No
aneurysm is identified.

Venous sinuses: Patent.

Anatomic variants: None.

Review of the MIP images confirms the above findings
IMPRESSION: 1. No large vessel occlusion, significant stenosis, or aneurysm in
the head and neck.
2. Emphysema ([id number]).
